# Patient Record
Sex: Female | Born: 1990 | Race: White | Hispanic: No | Marital: Single | Smoking: Current every day smoker
Health system: Southern US, Community
[De-identification: ages and names within clinical notes are randomized; demographics above are authoritative.]

## PROBLEM LIST (undated history)

## (undated) ENCOUNTER — Inpatient Hospital Stay (HOSPITAL_COMMUNITY): Payer: Self-pay

## (undated) DIAGNOSIS — O2341 Unspecified infection of urinary tract in pregnancy, first trimester: Secondary | ICD-10-CM

## (undated) DIAGNOSIS — F32A Depression, unspecified: Secondary | ICD-10-CM

## (undated) DIAGNOSIS — B999 Unspecified infectious disease: Secondary | ICD-10-CM

## (undated) DIAGNOSIS — F329 Major depressive disorder, single episode, unspecified: Secondary | ICD-10-CM

## (undated) DIAGNOSIS — K219 Gastro-esophageal reflux disease without esophagitis: Secondary | ICD-10-CM

## (undated) DIAGNOSIS — B009 Herpesviral infection, unspecified: Secondary | ICD-10-CM

## (undated) HISTORY — DX: Gastro-esophageal reflux disease without esophagitis: K21.9

## (undated) HISTORY — DX: Depression, unspecified: F32.A

## (undated) HISTORY — DX: Major depressive disorder, single episode, unspecified: F32.9

## (undated) HISTORY — PX: DILATION AND CURETTAGE OF UTERUS: SHX78

## (undated) HISTORY — DX: Herpesviral infection, unspecified: B00.9

## (undated) HISTORY — DX: Unspecified infection of urinary tract in pregnancy, first trimester: O23.41

---
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
## 2011-01-01 LAB — RAPID URINE DRUG SCREEN, HOSP PERFORMED
Barbiturates: NOT DETECTED
Opiates: NOT DETECTED
Tetrahydrocannabinol: POSITIVE — AB

## 2011-01-05 LAB — URINALYSIS, ROUTINE W REFLEX MICROSCOPIC
Glucose, UA: NEGATIVE mg/dL
Nitrite: NEGATIVE
Protein, ur: NEGATIVE mg/dL
pH: 5.5 (ref 5.0–8.0)

## 2011-01-05 LAB — CBC
HCT: 31.8 % — ABNORMAL LOW (ref 36.0–49.0)
Platelets: 183 10*3/uL (ref 150–400)
WBC: 7.5 10*3/uL (ref 4.5–13.5)

## 2011-01-05 LAB — COMPREHENSIVE METABOLIC PANEL
ALT: 10 U/L (ref 0–35)
AST: 17 U/L (ref 0–37)
Albumin: 3.7 g/dL (ref 3.5–5.2)
Alkaline Phosphatase: 67 U/L (ref 47–119)
BUN: 6 mg/dL (ref 6–23)
Chloride: 106 mEq/L (ref 96–112)
Potassium: 3.4 mEq/L — ABNORMAL LOW (ref 3.5–5.1)
Sodium: 138 mEq/L (ref 135–145)
Total Bilirubin: 0.3 mg/dL (ref 0.3–1.2)
Total Protein: 6.5 g/dL (ref 6.0–8.3)

## 2011-01-05 LAB — DIFFERENTIAL
Basophils Absolute: 0 10*3/uL (ref 0.0–0.1)
Basophils Relative: 1 % (ref 0–1)
Eosinophils Absolute: 0 10*3/uL (ref 0.0–1.2)
Eosinophils Relative: 0 % (ref 0–5)
Monocytes Absolute: 0.4 10*3/uL (ref 0.2–1.2)
Monocytes Relative: 6 % (ref 3–11)

## 2011-01-05 LAB — PREGNANCY, URINE: Preg Test, Ur: NEGATIVE

## 2011-01-06 LAB — URINALYSIS, ROUTINE W REFLEX MICROSCOPIC
Glucose, UA: NEGATIVE mg/dL
Hgb urine dipstick: NEGATIVE
Protein, ur: 30 mg/dL — AB
Urobilinogen, UA: 4 mg/dL — ABNORMAL HIGH (ref 0.0–1.0)

## 2011-01-06 LAB — URINE MICROSCOPIC-ADD ON

## 2011-01-06 LAB — URINE CULTURE: Colony Count: 15000

## 2011-01-14 LAB — COMPREHENSIVE METABOLIC PANEL
ALT: 12 U/L (ref 0–35)
AST: 22 U/L (ref 0–37)
Albumin: 4.1 g/dL (ref 3.5–5.2)
Alkaline Phosphatase: 103 U/L (ref 47–119)
Calcium: 9.6 mg/dL (ref 8.4–10.5)
Potassium: 3.7 mEq/L (ref 3.5–5.1)
Sodium: 135 mEq/L (ref 135–145)
Total Protein: 7.3 g/dL (ref 6.0–8.3)

## 2011-01-14 LAB — URINE MICROSCOPIC-ADD ON

## 2011-01-14 LAB — CBC
MCHC: 33 g/dL (ref 31.0–37.0)
RDW: 15.3 % (ref 11.4–15.5)

## 2011-01-14 LAB — URINALYSIS, ROUTINE W REFLEX MICROSCOPIC
Glucose, UA: NEGATIVE mg/dL
Hgb urine dipstick: NEGATIVE
Ketones, ur: >80 mg/dL — AB
Leukocytes, UA: NEGATIVE
Protein, ur: 100 mg/dL — AB
pH: 6 (ref 5.0–8.0)

## 2011-01-14 LAB — DIFFERENTIAL
Eosinophils Absolute: 0 10*3/uL (ref 0.0–1.2)
Eosinophils Relative: 0 % (ref 0–5)
Lymphs Abs: 1.1 10*3/uL (ref 1.1–4.8)
Monocytes Absolute: 0.4 10*3/uL (ref 0.2–1.2)
Monocytes Relative: 6 % (ref 3–11)

## 2011-06-19 LAB — STREP A DNA PROBE

## 2011-06-25 LAB — URINALYSIS, ROUTINE W REFLEX MICROSCOPIC
Ketones, ur: NEGATIVE
Nitrite: NEGATIVE
pH: 7.5

## 2011-06-25 LAB — URINE MICROSCOPIC-ADD ON

## 2011-06-25 LAB — URINE CULTURE: Culture: NO GROWTH

## 2011-07-11 LAB — URINALYSIS, ROUTINE W REFLEX MICROSCOPIC
Glucose, UA: NEGATIVE
Nitrite: NEGATIVE
Specific Gravity, Urine: 1.02
Specific Gravity, Urine: 1.015
Urobilinogen, UA: 1
Urobilinogen, UA: 1
pH: 7

## 2011-07-11 LAB — COMPREHENSIVE METABOLIC PANEL
Alkaline Phosphatase: 93
BUN: 6
Calcium: 9.7
Creatinine, Ser: 0.54
Glucose, Bld: 88
Potassium: 3.8
Total Protein: 7.4

## 2011-07-11 LAB — URINE MICROSCOPIC-ADD ON

## 2011-07-11 LAB — BASIC METABOLIC PANEL
BUN: 8
CO2: 26
Chloride: 103
Creatinine, Ser: 0.53

## 2011-07-11 LAB — CBC
HCT: 33.9
Hemoglobin: 11
MCHC: 32.5
MCHC: 32.9
MCV: 75 — ABNORMAL LOW
MCV: 74.6 — ABNORMAL LOW
Platelets: 311
Platelets: 283
RDW: 17.4 — ABNORMAL HIGH
RDW: 16.7 — ABNORMAL HIGH

## 2011-07-11 LAB — DIFFERENTIAL
Basophils Absolute: 0
Basophils Relative: 1
Basophils Relative: 1
Eosinophils Absolute: 0.1
Lymphocytes Relative: 35
Monocytes Relative: 7
Neutro Abs: 3.3
Neutrophils Relative %: 57
Neutrophils Relative %: 59

## 2011-07-11 LAB — URINE CULTURE: Colony Count: 35000

## 2011-07-17 LAB — CBC
HCT: 31 — ABNORMAL LOW
Hemoglobin: 10.3 — ABNORMAL LOW
MCHC: 33.2
MCV: 71.2 — ABNORMAL LOW
Platelets: 276
RBC: 4.36
RDW: 18.9 — ABNORMAL HIGH
WBC: 6.9

## 2011-07-17 LAB — DIFFERENTIAL
Basophils Relative: 0
Lymphs Abs: 1.6
Monocytes Relative: 5
Neutro Abs: 4.9
Neutrophils Relative %: 70 — ABNORMAL HIGH

## 2011-10-23 ENCOUNTER — Encounter: Payer: Self-pay | Admitting: Family Medicine

## 2011-10-23 ENCOUNTER — Ambulatory Visit (INDEPENDENT_AMBULATORY_CARE_PROVIDER_SITE_OTHER): Payer: Medicaid Other | Admitting: Family Medicine

## 2011-10-23 VITALS — BP 105/77 | HR 72 | Temp 97.9°F | Ht 61.0 in | Wt 104.6 lb

## 2011-10-23 DIAGNOSIS — Z3201 Encounter for pregnancy test, result positive: Secondary | ICD-10-CM

## 2011-10-23 DIAGNOSIS — F32A Depression, unspecified: Secondary | ICD-10-CM | POA: Insufficient documentation

## 2011-10-23 DIAGNOSIS — F329 Major depressive disorder, single episode, unspecified: Secondary | ICD-10-CM

## 2011-10-23 DIAGNOSIS — N926 Irregular menstruation, unspecified: Secondary | ICD-10-CM

## 2011-10-23 DIAGNOSIS — R112 Nausea with vomiting, unspecified: Secondary | ICD-10-CM

## 2011-10-23 DIAGNOSIS — N939 Abnormal uterine and vaginal bleeding, unspecified: Secondary | ICD-10-CM

## 2011-10-23 MED ORDER — RANITIDINE HCL 150 MG PO CAPS: 150.0000 mg | ORAL_CAPSULE | Freq: Every evening | ORAL | Status: DC

## 2011-10-23 NOTE — Assessment & Plan Note (Addendum)
Intermenstrual spotting. Timing seems interesting with positive home pregnancy test, but u preg negative today. Possibly implantation bleeding if this is early pregnancy. Will f/u in 2 weeks for recheck of pregnancy test. Will also check GC/Ch cultures, wet prep at that time as patient's urine sample has already been wasted. Condoms given today.

## 2011-10-23 NOTE — Progress Notes (Signed)
  Subjective:    Patient ID: April [name], female    DOB: 1991/03/14, 21 y.o.   MRN: [id number]  HPI Re-establish care  1. Positive pregnancy test. LMP was end of last month. Positive at home 7 days ago.  Checked test due to having some extra spotting, none in past 5 days. Also having some nausea and vomiting intermittently-similar to last pregnancy. Last unprotected sex was 5 days ago. Does not desire pregnancy. Not using contraception. Comes for confirmatory testing today. If negative desires to start depo injections.   2. ?depression. Patient having mood swings rapidly throughout the day. Friends have told her they think she's bipolar. Sometimes feels depressed or anxious. Anger issues. Has trouble sleeping. Denies racing thoughts, pressured speech, grandiosity.   Hx of ?postpartum depression after first pregnancy at age 24.   Review of Systems     Objective:   Physical Exam  Vitals reviewed. Constitutional: She is oriented to person, place, and time. She appears well-developed and well-nourished. No distress.       Seems quite happy preg test is negative in office. Smiles and laughs.  HENT:  Head: Normocephalic and atraumatic.  Eyes: EOM are normal.  Cardiovascular: Normal rate, regular rhythm, normal heart sounds and intact distal pulses.   No murmur heard. Pulmonary/Chest: Effort normal and breath sounds normal. No respiratory distress. She has no wheezes.  Abdominal: Soft. Bowel sounds are normal. She exhibits no distension. There is no tenderness. There is no rebound and no guarding.  Musculoskeletal: She exhibits no edema.  Neurological: She is alert and oriented to person, place, and time. Coordination normal.  Skin: No rash noted. She is not diaphoretic.  Psychiatric: She has a normal mood and affect.      Assessment & Plan:

## 2011-10-23 NOTE — Assessment & Plan Note (Signed)
Minimal time to discuss details. I see this is a recurrent complaint as she was evaluated in last postpartum period for similar symptoms. Not acutely worsened. Seems to have some mood swings and possibly anger confounding. Discussed options of beginning with therapist (Dr. Pascal Lux vs Family services). Gave PHQ-9 and Mood DO questionnare to return in 2 weeks for more detailed discussion. If not pregnant may consider trial of SSRI.

## 2011-10-23 NOTE — Assessment & Plan Note (Signed)
None today. Upreg negative. Possible this is VERY early pregnancy since last unprotected intercourse 5 days ago. Have advised trial of either ranitidine (sent rx to pharmacy) or OTC prilosec for possible gastritis. No abdominal pain, fevers or other symptoms of UTI/PID. F/u in 2 weeks.

## 2011-10-23 NOTE — Patient Instructions (Signed)
Use condoms if you have sex. Try to take prilosec for stomach irritation. I have sent this to your pharmacy. Make an appointment in 2 weeks with Dr. Cristal Ford for followup depression and for pregnancy test and depo shot if negative.  Depression, Adolescent and Adult Depression is a true and treatable medical condition. In general there are two kinds of depression:  Depression we all experience in some form. For example depression from the death of a loved one, financial distress or natural disasters will trigger or increase depression.   Clinical depression, on the other hand, appears without an apparent cause or reason. This depression is a disease. Depression may be caused by chemical imbalance in the body and brain or may come as a response to a physical illness. Alcohol and other drugs can cause depression.  DIAGNOSIS  The diagnosis of depression is usually based upon symptoms and medical history. TREATMENT  Treatments for depression fall into three categories. These are:  Drug therapy. There are many medicines that treat depression. Responses may vary and sometimes trial and error is necessary to determine the best medicines and dosage for a particular patient.   Psychotherapy, also called talking treatments, helps people resolve their problems by looking at them from a different point of view and by giving people insight into their own personal makeup. Traditional psychotherapy looks at a childhood source of a problem. Other psychotherapy will look at current conflicts and move toward solving those. If the cause of depression is drug use, counseling is available to help abstain. In time the depression will usually improve. If there were underlying causes for the chemical use, they can be addressed.   ECT (electroconvulsive therapy) or shock treatment is not as commonly used today. It is a very effective treatment for severe suicidal depression. During ECT electrical impulses are applied to the  head. These impulses cause a generalized seizure. It can be effective but causes a loss of memory for recent events. Sometimes this loss of memory may include the last several months.  Treat all depression or suicide threats as serious. Obtain professional help. Do not wait to see if serious depression will get better over time without help. Seek help for yourself or those around you. In the [place] the number to the National Suicide Help Lines With 24 Hour Help Are: 1-800-SUICIDE (854)409-4600 Document Released: 09/12/2000 Document Revised: 05/28/2011 Document Reviewed: 05/03/2008 [facility] Patient Information 2012 [place], [place].

## 2011-11-07 ENCOUNTER — Encounter: Payer: Self-pay | Admitting: Family Medicine

## 2011-11-07 ENCOUNTER — Ambulatory Visit (INDEPENDENT_AMBULATORY_CARE_PROVIDER_SITE_OTHER): Payer: Self-pay | Admitting: Family Medicine

## 2011-11-07 VITALS — BP 111/69 | HR 81 | Temp 97.7°F | Ht 61.0 in | Wt 104.1 lb

## 2011-11-07 DIAGNOSIS — F329 Major depressive disorder, single episode, unspecified: Secondary | ICD-10-CM

## 2011-11-07 DIAGNOSIS — G478 Other sleep disorders: Secondary | ICD-10-CM

## 2011-11-07 DIAGNOSIS — F3289 Other specified depressive episodes: Secondary | ICD-10-CM

## 2011-11-07 DIAGNOSIS — G479 Sleep disorder, unspecified: Secondary | ICD-10-CM | POA: Insufficient documentation

## 2011-11-07 DIAGNOSIS — Z309 Encounter for contraceptive management, unspecified: Secondary | ICD-10-CM

## 2011-11-07 LAB — POCT URINALYSIS DIPSTICK
Bilirubin, UA: NEGATIVE
Leukocytes, UA: NEGATIVE
Nitrite, UA: NEGATIVE
Urobilinogen, UA: 0.2
pH, UA: 5.5

## 2011-11-07 LAB — POCT URINE PREGNANCY: Preg Test, Ur: NEGATIVE

## 2011-11-07 MED ORDER — MELATONIN 1 MG PO CAPS: 1.0000 mg | ORAL_CAPSULE | Freq: Every day | ORAL | Status: DC

## 2011-11-07 MED ORDER — MEDROXYPROGESTERONE ACETATE 150 MG/ML IM SUSP: 150.0000 mg | INTRAMUSCULAR | Status: DC

## 2011-11-07 MED ORDER — MEDROXYPROGESTERONE ACETATE 150 MG/ML IM SUSP
150.0000 mg | Freq: Once | INTRAMUSCULAR | Status: AC
  Administered 2011-11-07: 150 mg via INTRAMUSCULAR

## 2011-11-07 NOTE — Progress Notes (Signed)
  Subjective:    Patient ID: April [name], female    DOB: 04/02/1991, 21 y.o.   MRN: [id number]  HPI  1. Contraception. F/u today for repeat u preg. Last intercourse was 3-4 weeks ago. Desires depo injection. Has a 22 yo son present for interview today.  2. Mood problems. Patient did not complete MDQ or PHQ-9 that was given at last visit. She continues to endorse some irritability, mood swings within minutes-feeling happy then sad. She does endorse some hyperactivity, lessened sleep requirement, risky behaviors, and uncharacteristic social behaviors but endorses no problems with her ability to function/work. Also endorses feeling down on most days. She has never been on any meds or needed hospitalization. She states that "bipolar" runs in her family, her brother has taken medication for this in the past. Her main problem is only that her mother and her do not always get along. Pt is able to take care of her son. Denies drug or alcohol use.   3. Sleeping problems. Her main complaint today is having difficulty sleeping. Trouble falling asleep, but when she does fall asleep she can usually sleep through the night. States she took a cousin's xanax one time and slept like a baby. Does not seem to think anything is wrong with that. Denies racing thoughts.   Review of Systems See HPI otherwise negative.     Objective:   Physical Exam  Vitals reviewed. Constitutional: She is oriented to person, place, and time. She appears well-developed and well-nourished. No distress.       Smiles appropriately. Normal speech and thought processes.   HENT:  Head: Normocephalic and atraumatic.  Eyes: EOM are normal.  Cardiovascular: Normal rate, regular rhythm, normal heart sounds and intact distal pulses.   No murmur heard. Pulmonary/Chest: Effort normal.  Neurological: She is alert and oriented to person, place, and time. She exhibits normal muscle tone. Coordination normal.  Psychiatric: She has a normal mood  and affect. Her behavior is normal. Judgment and thought content normal.       Affect euthymic. Normal dress and appearance.   MDQ described above PHQ-9 score is 9 (3 points for trouble falling asleep and 3 for feeling down)    Assessment & Plan:

## 2011-11-07 NOTE — Assessment & Plan Note (Signed)
Discussed sleep hygeine and avoidance of taking other's controlled prescription meds. Advised to start with routine setting and may try melatonin nightly to help natural rhythm. F/u if not improved prn.

## 2011-11-07 NOTE — Patient Instructions (Signed)
NIce to see you again. You may have bipolar disorder. Whenever you can, make an appointment for Mood Disorder clinic/Dr. Pascal Lux. Ok to try melatonin for sleep. Take before bedtime.

## 2011-11-07 NOTE — Assessment & Plan Note (Signed)
Symptoms do not fit with mania as she describes. Sounds more like irritability with frequent mood swings in a single day, do not seem to interfere with lifestyle. Given her family history, she may very well have a component of mood disorder/hypomania but do not feel medical treatment is necessary at this time. Will not start SSRI for possibility of triggering any underlying mood disorder. Have strongly advised patient to seek an psychologist/counselor as she would benefit from therapy, more in depth evaluation of mood symptoms. Also offered evaluation with Mood DO clinic here, but patient seems resistant bc she lives 90 minutes away.

## 2011-11-07 NOTE — Assessment & Plan Note (Signed)
Patient desires to restart depo injections today.

## 2011-11-12 ENCOUNTER — Telehealth: Payer: Self-pay | Admitting: *Deleted

## 2011-11-12 NOTE — Telephone Encounter (Signed)
Attempted to call patient to inform her that urine was normal. Her phone message says that she is unable to take calls at this time.

## 2011-11-12 NOTE — Telephone Encounter (Signed)
Message copied by Farrell Ours on Wed Nov 12, 2011 10:23 AM ------      Message from: Durwin Reges      Created: Tue Nov 11, 2011  7:20 PM      Regarding: Normal urine        Please notify pt her urine test is normal.

## 2012-01-23 ENCOUNTER — Ambulatory Visit: Payer: Self-pay

## 2012-02-12 ENCOUNTER — Emergency Department (HOSPITAL_COMMUNITY)
Admission: EM | Admit: 2012-02-12 | Discharge: 2012-02-13 | Disposition: A | Discharge status: Home / Self Care | Payer: Medicaid Other | Attending: Emergency Medicine | Admitting: Emergency Medicine

## 2012-02-12 ENCOUNTER — Encounter (HOSPITAL_COMMUNITY): Payer: Self-pay | Admitting: Emergency Medicine

## 2012-02-12 DIAGNOSIS — N949 Unspecified condition associated with female genital organs and menstrual cycle: Secondary | ICD-10-CM | POA: Insufficient documentation

## 2012-02-12 DIAGNOSIS — K219 Gastro-esophageal reflux disease without esophagitis: Secondary | ICD-10-CM | POA: Insufficient documentation

## 2012-02-12 DIAGNOSIS — F3289 Other specified depressive episodes: Secondary | ICD-10-CM | POA: Insufficient documentation

## 2012-02-12 DIAGNOSIS — N938 Other specified abnormal uterine and vaginal bleeding: Secondary | ICD-10-CM | POA: Insufficient documentation

## 2012-02-12 DIAGNOSIS — M545 Low back pain, unspecified: Secondary | ICD-10-CM | POA: Insufficient documentation

## 2012-02-12 DIAGNOSIS — R112 Nausea with vomiting, unspecified: Secondary | ICD-10-CM | POA: Insufficient documentation

## 2012-02-12 DIAGNOSIS — R109 Unspecified abdominal pain: Secondary | ICD-10-CM | POA: Insufficient documentation

## 2012-02-12 DIAGNOSIS — N898 Other specified noninflammatory disorders of vagina: Secondary | ICD-10-CM | POA: Insufficient documentation

## 2012-02-12 DIAGNOSIS — F329 Major depressive disorder, single episode, unspecified: Secondary | ICD-10-CM | POA: Insufficient documentation

## 2012-02-12 DIAGNOSIS — R630 Anorexia: Secondary | ICD-10-CM | POA: Insufficient documentation

## 2012-02-12 DIAGNOSIS — R6883 Chills (without fever): Secondary | ICD-10-CM | POA: Insufficient documentation

## 2012-02-12 MED ORDER — ONDANSETRON HCL 4 MG/2ML IJ SOLN
4.0000 mg | Freq: Once | INTRAMUSCULAR | Status: AC
  Administered 2012-02-13: 4 mg via INTRAVENOUS
  Filled 2012-02-12: qty 2

## 2012-02-12 MED ORDER — SODIUM CHLORIDE 0.9 % IV BOLUS (SEPSIS)
1000.0000 mL | Freq: Once | INTRAVENOUS | Status: AC
  Administered 2012-02-13: 1000 mL via INTRAVENOUS

## 2012-02-12 MED ORDER — MORPHINE SULFATE 4 MG/ML IJ SOLN
4.0000 mg | Freq: Once | INTRAMUSCULAR | Status: AC
  Administered 2012-02-13: 4 mg via INTRAVENOUS
  Filled 2012-02-12: qty 1

## 2012-02-12 NOTE — ED Provider Notes (Signed)
History     CSN: [id number]  Arrival date & time 02/12/12  2256   First MD Initiated Contact with Patient 02/12/12 2320      Chief Complaint  Patient presents with  . Vaginal Bleeding    (Consider location/radiation/quality/duration/timing/severity/associated sxs/prior treatment) HPI  pw vaginal bleeding x several weeks since January 30th after receiving Depo shot. Bleeding through 5-6 pads per day. Blood clots. C/O diffuse lower abdominal pain, cramping in nature. States that abdominal pain has been daily x same length. Currently 10/10. C/O lower back cramping. +Chills, denies fever. Denies hematuria/dysuria/freq/urgency. +Nausea with NBNB emesis. Dec PO intake.   Was seen yesterday at [facility] hospital and given IVF and pain medication.    ED Notes, ED Provider Notes from 02/12/12 0000 to 02/12/12 23:14:30       Nada Libman, RN 02/12/2012 23:12      PT. REPORTS VAGINAL BLEEDING SINCE JAN. 30 , 2013 , STATES BLEEDING STARTED AFTER RECEIVING DEPO INJECTION , SEEN AT [facility] H[facility] ER YESTERDAY , RECEIVED IV FLUIDS AND PAIN MEDICATIONS FOR LOW ABDOMINAL PAIN .      Past Medical History  Diagnosis Date  . Depression   . GERD (gastroesophageal reflux disease)     Past Surgical History  Procedure Date  . Dilation and curettage of uterus     Family History  Problem Relation Age of Onset  . Hypertension Maternal Grandmother   . Depression Maternal Grandmother   . Cancer Maternal Grandfather     lung    History  Substance Use Topics  . Smoking status: Current Everyday Smoker  . Smokeless tobacco: Not on file  . Alcohol Use: No    OB History    Grav Para Term Preterm Abortions TAB SAB Ect Mult Living                  Review of Systems  All other systems reviewed and are negative.   except as noted HPI   Allergies  Review of patient's allergies indicates no known allergies.  Home Medications   Current Outpatient Rx  Name Route Sig Dispense  Refill  . HYDROCODONE-ACETAMINOPHEN 5-325 MG PO TABS Oral Take 2 tablets by mouth every 4 (four) hours as needed for pain. 10 tablet 0  . IBUPROFEN 600 MG PO TABS Oral Take 1 tablet (600 mg total) by mouth every 6 (six) hours as needed for pain. 30 tablet 0    BP 104/75  Pulse 97  Temp(Src) 98.1 F (36.7 C) (Oral)  Resp 18  SpO2 100%  Physical Exam  Nursing note and vitals reviewed. Constitutional: She is oriented to person, place, and time. She appears well-developed.  HENT:  Head: Atraumatic.  Mouth/Throat: Oropharynx is clear and moist.  Eyes: Conjunctivae and EOM are normal. Pupils are equal, round, and reactive to light.  Neck: Normal range of motion. Neck supple.  Cardiovascular: Normal rate, regular rhythm, normal heart sounds and intact distal pulses.   Pulmonary/Chest: Effort normal and breath sounds normal. No respiratory distress. She has no wheezes. She has no rales.  Abdominal: Soft. She exhibits no distension. There is tenderness. There is no rebound and no guarding.       Min diffuse abd ttp  Genitourinary:       Blood in vaginal vault No discharge No cmt No r/l adnexal ttp  Musculoskeletal: Normal range of motion.  Neurological: She is alert and oriented to person, place, and time.  Skin: Skin is warm and dry. No rash  noted.  Psychiatric: She has a normal mood and affect.    ED Course  Procedures (including critical care time)  Labs Reviewed  URINALYSIS, DIPSTICK ONLY - Abnormal; Notable for the following:    Hgb urine dipstick LARGE (*)    Ketones, ur >80 (*)    All other components within normal limits  POCT PREGNANCY, URINE  LAB REPORT - SCANNED   No results found.   1. Dysfunctional uterine bleeding     MDM  DUB and crampy lower abd pain after depoprovera injection. Hgb unremarkable. Preg negative. Wet prep and gc/chl performed recently OSH, she declined today. Will not be prescribed estrogen by me today as she is a smoker- higher risk for VTE.  OBGYN f/u.        Forbes Cellar, MD 02/16/12 2137

## 2012-02-12 NOTE — ED Notes (Signed)
PT. REPORTS VAGINAL BLEEDING SINCE JAN. 30 , 2013 , STATES BLEEDING STARTED AFTER RECEIVING DEPO INJECTION , SEEN AT [facility] ER YESTERDAY , RECEIVED IV FLUIDS AND PAIN MEDICATIONS FOR LOW ABDOMINAL PAIN .

## 2012-02-13 LAB — URINALYSIS, DIPSTICK ONLY
Glucose, UA: NEGATIVE mg/dL
Leukocytes, UA: NEGATIVE
Nitrite: NEGATIVE
Protein, ur: NEGATIVE mg/dL
Urobilinogen, UA: 1 mg/dL (ref 0.0–1.0)

## 2012-02-13 MED ORDER — MORPHINE SULFATE 4 MG/ML IJ SOLN
4.0000 mg | Freq: Once | INTRAMUSCULAR | Status: AC
Start: 2012-02-13 — End: 2012-02-13
  Administered 2012-02-13: 4 mg via INTRAVENOUS
  Filled 2012-02-13: qty 1

## 2012-02-13 MED ORDER — HYDROCODONE-ACETAMINOPHEN 5-325 MG PO TABS: 2.0000 | ORAL_TABLET | ORAL | Status: AC | PRN

## 2012-02-13 MED ORDER — IBUPROFEN 600 MG PO TABS: 600.0000 mg | ORAL_TABLET | Freq: Four times a day (QID) | ORAL | Status: AC | PRN

## 2012-02-13 NOTE — ED Notes (Signed)
Pt given liquids

## 2012-02-13 NOTE — Discharge Instructions (Signed)
Abnormal Uterine Bleeding Abnormal uterine bleeding can have many causes. Some cases are simply treated, while others are more serious. There are several kinds of bleeding that is considered abnormal, including:  Bleeding between periods.   Bleeding after sexual intercourse.   Spotting anytime in the menstrual cycle.   Bleeding heavier or more than normal.   Bleeding after menopause.  CAUSES  There are many causes of abnormal uterine bleeding. It can be present in teenagers, pregnant women, women during their reproductive years, and women who have reached menopause. Your caregiver will look for the more common causes depending on your age, signs, symptoms and your particular circumstance. Most cases are not serious and can be treated. Even the more serious causes, like cancer of the female organs, can be treated adequately if found in the early stages. That is why all types of bleeding should be evaluated and treated as soon as possible. DIAGNOSIS  Diagnosing the cause may take several kinds of tests. Your caregiver may:  Take a complete history of the type of bleeding.   Perform a complete physical exam and Pap smear.   Take an ultrasound on the abdomen showing a picture of the female organs and the pelvis.   Inject dye into the uterus and Fallopian tubes and X-ray them (hysterosalpingogram).   Place fluid in the uterus and do an ultrasound (sonohysterogrqphy).   Take a CT scan to examine the female organs and pelvis.   Take an MRI to examine the female organs and pelvis. There is no X-ray involved with this procedure.   Look inside the uterus with a telescope that has a light at the end (hysteroscopy).   Scrap the inside of the uterus to get tissue to examine (Dilatation and Curettage, D&C).   Look into the pelvis with a telescope that has a light at the end (laparoscopy). This is done through a very small cut (incision) in the abdomen.  TREATMENT  Treatment will depend on the  cause of the abnormal bleeding. It can include:  Doing nothing to allow the problem to take care of itself over time.   Hormone treatment.   Birth control pills.   Treating the medical condition causing the problem.   Laparoscopy.   Major or minor surgery   Destroying the lining of the uterus with electrical currant, laser, freezing or heat (uterine ablation).  HOME CARE INSTRUCTIONS   Follow your caregiver's recommendation on how to treat your problem.   See your caregiver if you missed a menstrual period and think you may be pregnant.   If you are bleeding heavily, count the number of pads/tampons you use and how often you have to change them. Tell this to your caregiver.   Avoid sexual intercourse until the problem is controlled.  SEEK MEDICAL CARE IF:   You have any kind of abnormal bleeding mentioned above.   You feel dizzy at times.   You are 21 years old and have not had a menstrual period yet.  SEEK IMMEDIATE MEDICAL CARE IF:   You pass out.   You are changing pads/tampons every 15 to 30 minutes.   You have belly (abdominal) pain.   You have a temperature of 100 F (37.8 C) or higher.   You become sweaty or weak.   You are passing large blood clots from the vagina.   You start to feel sick to your stomach (nauseous) and throw up (vomit).  Document Released: 09/15/2005 Document Revised: 09/04/2011 Document Reviewed: 02/08/2009 ExitCare   Patient Information 2012 [place], [place].  RESOURCE GUIDE  Dental Problems  Patients with Medicaid: [facility] [phone] [address]                                           [phone] [address] Phone:  [phone]                                                   Phone:  [phone]  If unable to pay or uninsured, contact:  [facility] or [facility]. to become qualified for the adult dental clinic.  Chronic Pain Problems Contact [facility]  [phone] Patients need to be referred by their primary care doctor.  Insufficient Money for Medicine Contact United Way:  call "211" or [facility] [phone].  No Primary Care Doctor Call [facility]  2498146666 Other agencies that provide inexpensive medical care    [facility]  [phone]    [facility]  [phone]    [facility]  [phone]    [facility]  [phone]    [facility]  [phone]    [facility]  [phone]  Psychological Services [facility] Behavioral Health  [phone] [facility]  [phone] [facility] Mental Health   [phone] (emergency services [phone])  Abuse/Neglect [facility] Child Abuse Hotline 754-647-5374 [facility] Child Abuse Hotline [phone] (After Hours)  Emergency Shelter [facility] [phone]  Maternity Homes [facility] [place] [facility] [place] [phone] [facility] [phone]  MRSA Hotline #:   639-678-0326    [facility] Resources  [facility] [place]  United [employer]                           [facility] Dept. [address] [place]                     [address]         [address] [place] [address]  Blondell Reveal Phone:  [phone]                                  Phone:  [phone]                   Phone:  [phone]  [facility] Mental Health Phone:  [phone]  [facility] Child Abuse Hotline 508-547-0890 [phone] (After Hours)

## 2012-02-13 NOTE — ED Notes (Signed)
IV removed.

## 2012-02-13 NOTE — ED Notes (Signed)
The pt is here for abd pain and vaginal bleeding that she  Has had for 6 months.  She was seen at [facility] ed last pm

## 2013-05-25 ENCOUNTER — Encounter (HOSPITAL_COMMUNITY): Payer: Self-pay | Admitting: *Deleted

## 2013-05-25 ENCOUNTER — Emergency Department (HOSPITAL_COMMUNITY)
Admission: EM | Admit: 2013-05-25 | Discharge: 2013-05-26 | Disposition: A | Discharge status: Home / Self Care | Payer: Self-pay | Attending: Emergency Medicine | Admitting: Emergency Medicine

## 2013-05-25 DIAGNOSIS — N946 Dysmenorrhea, unspecified: Secondary | ICD-10-CM | POA: Insufficient documentation

## 2013-05-25 DIAGNOSIS — N898 Other specified noninflammatory disorders of vagina: Secondary | ICD-10-CM | POA: Insufficient documentation

## 2013-05-25 DIAGNOSIS — R109 Unspecified abdominal pain: Secondary | ICD-10-CM | POA: Insufficient documentation

## 2013-05-25 DIAGNOSIS — F329 Major depressive disorder, single episode, unspecified: Secondary | ICD-10-CM | POA: Insufficient documentation

## 2013-05-25 DIAGNOSIS — K219 Gastro-esophageal reflux disease without esophagitis: Secondary | ICD-10-CM | POA: Insufficient documentation

## 2013-05-25 DIAGNOSIS — Z3202 Encounter for pregnancy test, result negative: Secondary | ICD-10-CM | POA: Insufficient documentation

## 2013-05-25 DIAGNOSIS — F3289 Other specified depressive episodes: Secondary | ICD-10-CM | POA: Insufficient documentation

## 2013-05-25 DIAGNOSIS — F17200 Nicotine dependence, unspecified, uncomplicated: Secondary | ICD-10-CM | POA: Insufficient documentation

## 2013-05-25 LAB — URINE MICROSCOPIC-ADD ON

## 2013-05-25 LAB — URINALYSIS, ROUTINE W REFLEX MICROSCOPIC
Bilirubin Urine: NEGATIVE
Glucose, UA: NEGATIVE mg/dL
Ketones, ur: NEGATIVE mg/dL
Specific Gravity, Urine: 1.023 (ref 1.005–1.030)
pH: 8 (ref 5.0–8.0)

## 2013-05-25 LAB — COMPREHENSIVE METABOLIC PANEL
Albumin: 4.6 g/dL (ref 3.5–5.2)
Alkaline Phosphatase: 77 U/L (ref 39–117)
BUN: 8 mg/dL (ref 6–23)
Chloride: 99 mEq/L (ref 96–112)
Creatinine, Ser: 0.67 mg/dL (ref 0.50–1.10)
GFR calc Af Amer: >90 mL/min (ref >90)
Glucose, Bld: 85 mg/dL (ref 70–99)
Potassium: 3.9 mEq/L (ref 3.5–5.1)
Total Bilirubin: 0.2 mg/dL — ABNORMAL LOW (ref 0.3–1.2)
Total Protein: 8.2 g/dL (ref 6.0–8.3)

## 2013-05-25 LAB — CBC WITH DIFFERENTIAL/PLATELET
Basophils Relative: 0 % (ref 0–1)
Eosinophils Absolute: 0 10*3/uL (ref 0.0–0.7)
HCT: 42.1 % (ref 36.0–46.0)
Hemoglobin: 14.6 g/dL (ref 12.0–15.0)
Lymphs Abs: 2.2 10*3/uL (ref 0.7–4.0)
MCH: 29.6 pg (ref 26.0–34.0)
MCHC: 34.7 g/dL (ref 30.0–36.0)
Monocytes Absolute: 0.7 10*3/uL (ref 0.1–1.0)
Monocytes Relative: 8 % (ref 3–12)
RBC: 4.94 MIL/uL (ref 3.87–5.11)

## 2013-05-25 LAB — LIPASE, BLOOD: Lipase: 32 U/L (ref 11–59)

## 2013-05-25 LAB — WET PREP, GENITAL: Yeast Wet Prep HPF POC: NONE SEEN

## 2013-05-25 LAB — POCT PREGNANCY, URINE: Preg Test, Ur: NEGATIVE

## 2013-05-25 MED ORDER — KETOROLAC TROMETHAMINE 30 MG/ML IJ SOLN
30.0000 mg | Freq: Once | INTRAMUSCULAR | Status: AC
  Administered 2013-05-25: 30 mg via INTRAMUSCULAR
  Filled 2013-05-25: qty 1

## 2013-05-25 MED ORDER — CEFTRIAXONE SODIUM 250 MG IJ SOLR
250.0000 mg | Freq: Once | INTRAMUSCULAR | Status: AC
  Administered 2013-05-26: 250 mg via INTRAMUSCULAR
  Filled 2013-05-25: qty 250

## 2013-05-25 MED ORDER — KETOROLAC TROMETHAMINE 30 MG/ML IJ SOLN: 30.0000 mg | Freq: Once | INTRAMUSCULAR | Status: DC

## 2013-05-25 MED ORDER — IBUPROFEN 800 MG PO TABS
800.0000 mg | ORAL_TABLET | Freq: Once | ORAL | Status: AC
  Administered 2013-05-25: 800 mg via ORAL
  Filled 2013-05-25: qty 1

## 2013-05-25 MED ORDER — AZITHROMYCIN 250 MG PO TABS
1000.0000 mg | ORAL_TABLET | Freq: Once | ORAL | Status: AC
  Administered 2013-05-26: 1000 mg via ORAL
  Filled 2013-05-25: qty 4

## 2013-05-25 NOTE — ED Provider Notes (Signed)
CSN: [id number]     Arrival date & time 05/25/13  2002 History   First MD Initiated Contact with Patient 05/25/13 2155     Chief Complaint  Patient presents with  . Vaginal Bleeding   (Consider location/radiation/quality/duration/timing/severity/associated sxs/prior Treatment) HPI Comments: Patient presents with a chief complaint of vaginal bleeding.  She reports that she has had bleeding for the past 8 days.  She states that she started her menstrual cycle 8 days ago.  She states that her menstrual cycles typically only last 6 days and that she was concerned that the bleeding has been lasting for 8 days.  She reports that she is changing a tampon/pad six times a day.  She reports that the bleeding has slowed down at this time.  Bleeding associated with some lower abdominal cramping.  She reports that the cramping is worse than the cramping that she typically has with her menstrual cycle.  She is currently sexually active and does have unprotected sex.  She also reports that she has noticed an odorous vaginal discharge that is brownish in color.  She denies nausea, vomiting, SOB, dizziness, lightheadedness, urinary symptoms, fever, chills, or fatigue.    Patient is a 22 y.o. female presenting with vaginal bleeding. The history is provided by the patient.  Vaginal Bleeding Associated symptoms: no dysuria and no nausea     Past Medical History  Diagnosis Date  . Depression   . GERD (gastroesophageal reflux disease)    Past Surgical History  Procedure Laterality Date  . Dilation and curettage of uterus     Family History  Problem Relation Age of Onset  . Hypertension Maternal Grandmother   . Depression Maternal Grandmother   . Cancer Maternal Grandfather     lung   History  Substance Use Topics  . Smoking status: Current Every Day Smoker  . Smokeless tobacco: Not on file  . Alcohol Use: No   OB History   Grav Para Term Preterm Abortions TAB SAB Ect Mult Living                  Review of Systems  Gastrointestinal: Negative for nausea and vomiting.  Genitourinary: Positive for vaginal bleeding, menstrual problem and pelvic pain. Negative for dysuria, urgency and frequency.  All other systems reviewed and are negative.    Allergies  Review of patient's allergies indicates no known allergies.  Home Medications  No current outpatient prescriptions on file. BP 110/81  Pulse 85  Temp(Src) 98.2 F (36.8 C) (Oral)  Resp 16  SpO2 100% Physical Exam  Nursing note and vitals reviewed. Constitutional: She appears well-developed and well-nourished.  HENT:  Head: Normocephalic and atraumatic.  Neck: Neck supple.  Cardiovascular: Normal rate, regular rhythm and normal heart sounds.   Pulmonary/Chest: Effort normal and breath sounds normal.  Abdominal: Soft. Bowel sounds are normal. She exhibits no distension and no mass. There is tenderness. There is no rebound and no guarding.  Mild suprapubic tenderness to palpation  Genitourinary: Cervix exhibits no motion tenderness. Right adnexum displays no mass, no tenderness and no fullness. Left adnexum displays no mass, no tenderness and no fullness.  No bleeding visualized on pelvic exam  Neurological: She is alert.  Skin: Skin is warm and dry.  Psychiatric: She has a normal mood and affect.    ED Course  Procedures (including critical care time) Labs Review Labs Reviewed  URINALYSIS, ROUTINE W REFLEX MICROSCOPIC - Abnormal; Notable for the following:    APPearance CLOUDY (*)  Hgb urine dipstick LARGE (*)    Leukocytes, UA SMALL (*)    All other components within normal limits  COMPREHENSIVE METABOLIC PANEL - Abnormal; Notable for the following:    Total Bilirubin 0.2 (*)    All other components within normal limits  URINE MICROSCOPIC-ADD ON - Abnormal; Notable for the following:    Squamous Epithelial / LPF MANY (*)    Bacteria, UA MANY (*)    All other components within normal limits  GC/CHLAMYDIA  PROBE AMP  WET PREP, GENITAL  URINE CULTURE  CBC WITH DIFFERENTIAL  LIPASE, BLOOD  POCT PREGNANCY, URINE   Imaging Review No results found.  MDM  No diagnosis found. Patient presenting with a chief complaint of abdominal cramping and vaginal bleeding.  She reports that she is currently on her menstrual cycle, but feels that the cramping is more severe than her usual cramping. Patient is afebrile.  Urine pregnancy negative.  Hemoglobin 14.6.  No pain with pelvic exam.  Patient does report that she does have unprotected sex and is concerned about STD.  GC/Chalmydia pending.  Patient treated empirically with Azithromycin and Ceftriaxone in the ED.  Feel that the patient is stable for discharge.  Patient given referral to Gynecology.  Return precautions discussed.    Pascal Lux Saltillo, PA-C 05/26/13 2232

## 2013-05-25 NOTE — ED Notes (Signed)
Pt reports that she has had vaginal bleeding x 1 week.  Reports that the discharge has an odor.  Pt also reports dry mouth.

## 2013-05-26 LAB — URINE CULTURE

## 2013-05-26 MED ORDER — LIDOCAINE HCL (PF) 1 % IJ SOLN
INTRAMUSCULAR | Status: AC
  Administered 2013-05-26: 0.9 mL
  Filled 2013-05-26: qty 5

## 2013-05-27 NOTE — ED Provider Notes (Signed)
Medical screening examination/treatment/procedure(s) were performed by non-physician practitioner and as supervising physician I was immediately available for consultation/collaboration.  Dayton Kenley F Clemons Salvucci, MD 05/27/13 1450 

## 2013-05-28 ENCOUNTER — Telehealth (HOSPITAL_COMMUNITY): Payer: Self-pay | Admitting: Emergency Medicine

## 2013-05-28 NOTE — ED Notes (Signed)
Patient has +Chlamydia. 

## 2013-05-28 NOTE — ED Notes (Signed)
+  Chlamydia. Patient treated with Rocephin and Zithromax. [employer] faxed. 

## 2013-06-01 NOTE — ED Notes (Signed)
Patient informed of positive results after id'd x 2 and informed of need to notify partner to be treated. 

## 2013-07-07 ENCOUNTER — Ambulatory Visit (INDEPENDENT_AMBULATORY_CARE_PROVIDER_SITE_OTHER): Payer: Medicaid Other | Admitting: Obstetrics & Gynecology

## 2013-07-07 ENCOUNTER — Encounter: Payer: Self-pay | Admitting: Obstetrics & Gynecology

## 2013-07-07 VITALS — BP 119/78 | HR 97 | Temp 96.8°F | Ht 61.0 in | Wt 110.2 lb

## 2013-07-07 DIAGNOSIS — N920 Excessive and frequent menstruation with regular cycle: Secondary | ICD-10-CM

## 2013-07-07 DIAGNOSIS — A749 Chlamydial infection, unspecified: Secondary | ICD-10-CM

## 2013-07-07 MED ORDER — AZITHROMYCIN 1 G PO PACK
1.0000 g | PACK | Freq: Once | ORAL | Status: AC
  Administered 2013-07-07: 1 g via ORAL

## 2013-07-07 NOTE — Progress Notes (Signed)
GYNECOLOGY CLINIC ENCOUNTER NOTE  History:  22 y.o. [id number] here today for evaluation of menorrhagia.  Reports that periods are now 8 days in duration used to be six days.  Uses six tampons a day, up from 3 or 4.  Was on Depo Provera in the past, caused a lot of bleeding so she stopped. Smokes 1 ppd x 6 years.   Of note, patient was diagnosed with Chlamydia on 05/25/13. She received Rx for Azithromycin but did not fill it.  Received Rocephin in the ER.   The following portions of the patient's history were reviewed and updated as appropriate: allergies, current medications, past family history, past medical history, past social history, past surgical history and problem list.  Review of Systems:  Pertinent items are noted in HPI.  Objective:  Physical Exam BP 119/78  Pulse 97  Temp(Src) 96.8 F (36 C)  Ht 5\' 1"  (1.549 m)  Wt 110 lb 3.2 oz (49.986 kg)  BMI 20.83 kg/m2  LMP 07/02/2013 Gen: NAD Abd: Soft, nontender and nondistended Pelvic: Normal appearing external genitalia; normal appearing vaginal mucosa and cervix.  Brown discharge noted.  Small uterus, no other palpable masses, mild uterine and adnexal tenderness  Assessment & Plan:  Will check menorrhagia labs and ultrasound. Recommended Mirena IUD, patient wants to get this treatment. Azithromycin given to patient here in clinic.   Recommend testing for other STIs, also needs to let partner(s) know so the partner(s) can get testing and treatment. No unprotected intercourse until she and her partner(s) are treated and until Mirena IUD placement. Return for IUD placement in about a month, will also get annual exam and TOC for chlamydia.

## 2013-07-07 NOTE — Patient Instructions (Signed)
Menorrhagia Dysfunctional uterine bleeding is different from a normal menstrual period. When periods are heavy or there is more bleeding than is usual for you, it is called menorrhagia. It may be caused by hormonal imbalance, or physical, metabolic, or other problems. Examination is necessary in order that your caregiver may treat treatable causes. If this is a continuing problem, a D&C may be needed. That means that the cervix (the opening of the uterus or womb) is dilated (stretched larger) and the lining of the uterus is scraped out. The tissue scraped out is then examined under a microscope by a specialist (pathologist) to make sure there is nothing of concern that needs further or more extensive treatment. HOME CARE INSTRUCTIONS   If medications were prescribed, take exactly as directed. Do not change or switch medications without consulting your caregiver.  Long term heavy bleeding may result in iron deficiency. Your caregiver may have prescribed iron pills. They help replace the iron your body lost from heavy bleeding. Take exactly as directed. Iron may cause constipation. If this becomes a problem, increase the bran, fruits, and roughage in your diet.  Do not take aspirin or medicines that contain aspirin one week before or during your menstrual period. Aspirin may make the bleeding worse.  If you need to change your sanitary pad or tampon more than once every 2 hours, stay in bed and rest as much as possible until the bleeding stops.  Eat well-balanced meals. Eat foods high in iron. Examples are leafy green vegetables, meat, liver, eggs, and whole grain breads and cereals. Do not try to lose weight until the abnormal bleeding has stopped and your blood iron level is back to normal. SEEK MEDICAL CARE IF:   You need to change your sanitary pad or tampon more than once an hour.  You develop nausea (feeling sick to your stomach) and vomiting, dizziness, or diarrhea while you are taking your  medicine.  You have any problems that may be related to the medicine you are taking. SEEK IMMEDIATE MEDICAL CARE IF:   You have a fever.  You develop chills.  You develop severe bleeding or start to pass blood clots.  You feel dizzy or faint. MAKE SURE YOU:   Understand these instructions.  Will watch your condition.  Will get help right away if you are not doing well or get worse. Document Released: 09/15/2005 Document Revised: 12/08/2011 Document Reviewed: 05/05/2008 [facility] Patient Information 2014 [place], [place].   Intrauterine Device Information An intrauterine device (IUD) is inserted into your uterus and prevents pregnancy. There are 2 types of IUDs available:  Copper IUD. This type of IUD is wrapped in copper wire and is placed inside the uterus. Copper makes the uterus and fallopian tubes produce a fluid that kills sperm. The copper IUD can stay in place for 10 years.  Hormone IUD. This type of IUD contains the hormone progestin (synthetic progesterone). The hormone thickens the cervical mucus and prevents sperm from entering the uterus, and it also thins the uterine lining to prevent implantation of a fertilized egg. The hormone can weaken or kill the sperm that get into the uterus. The hormone IUD can stay in place for 5 years. Your caregiver will make sure you are a good candidate for a contraceptive IUD. Discuss with your caregiver the possible side effects. ADVANTAGES  It is highly effective, reversible, long-acting, and low maintenance.  There are no estrogen-related side effects.  An IUD can be used when breastfeeding.  It is  not associated with weight gain.  It works immediately after insertion.  The copper IUD does not interfere with your female hormones.  The progesterone IUD can make heavy menstrual periods lighter.  The progesterone IUD can be used for 5 years.  The copper IUD can be used for 10 years. DISADVANTAGES  The progesterone IUD can  be associated with irregular bleeding patterns.  The copper IUD can make your menstrual flow heavier and more painful.  You may experience cramping and vaginal bleeding after insertion. Document Released: 08/19/2004 Document Revised: 12/08/2011 Document Reviewed: 01/18/2011 [facility] Patient Information 2014 [place], [place].   Intrauterine Device Insertion Most often, an intrauterine device (IUD) is inserted into the uterus to prevent pregnancy. There are 2 types of IUDs available:  Copper IUD. This type of IUD creates an environment that is not favorable to sperm survival. The mechanism of action of the copper IUD is not known for certain. It can stay in place for 10 years.  Hormone IUD. This type of IUD contains the hormone progestin (synthetic progesterone). The progestin thickens the cervical mucus and prevents sperm from entering the uterus, and it also thins the uterine lining. There is no evidence that the hormone IUD prevents implantation. The hormone IUD can stay in place for up to 5 years. An IUD is the most cost-effective birth control if left in place for the full duration. It may be removed at any time. LET YOUR CAREGIVER KNOW ABOUT:  Sensitivity to metals.  Medicines taken including herbs, eyedrops, over-the-counter medicines, and creams.  Use of steroids (by mouth or creams).  Previous problems with anesthetics or numbing medicine.  Previous gynecological surgery.  History of blood clots or clotting disorders.  Possibility of pregnancy.  Menstrual irregularities.  Concerns regarding unusual vaginal discharge or odors.  Previous experience with an IUD.  Other health problems. RISKS AND COMPLICATIONS  Accidental puncture (perforation) of the uterus.  Accidental placement of the IUD either in the muscle layer of the uterus (myometrium) or outside the uterus. If this happen, the IUD can be found essentially floating around the bowels. When this happens, the IUD  must be taken out surgically.  The IUD may fall out of the uterus (expulsion). This is more common in women who have recently had a child.   Pregnancy in the fallopian tube (ectopic). BEFORE THE PROCEDURE  Schedule the IUD insertion for when you will have your menstrual period or right after, to make sure you are not pregnant. Placement of the IUD is better tolerated shortly after a menstrual cycle.  You may need to take tests or be examined to make sure you are not pregnant.  You may be required to take a pregnancy test.  You may be required to get checked for sexually transmitted infections (STIs) prior to placement. Placing an IUD in someone who has an infection can make an infection worse.  You may be given a pain reliever to take 1 or 2 hours before the procedure.  An exam will be performed to determine the size and position of your uterus.  Ask your caregiver about changing or stopping your regular medicines. PROCEDURE   A tool (speculum) is placed in the vagina. This allows your caregiver to see the lower part of the uterus (cervix).  The cervix is prepped with a medicine that lowers the risk of infection.  You may be given a medicine to numb each side of the cervix (intracervical or paracervical block). This is used to block and  control any discomfort with insertion.  A tool (uterine sound) is inserted into the uterus to determine the length of the uterine cavity and the direction the uterus may be tilted.  A slim instrument (IUD inserter) is inserted through the cervical canal and into your uterus.  The IUD is placed in the uterine cavity and the insertion device is removed.  The nylon string that is attached to the IUD, and used for eventual IUD removal, is trimmed. It is trimmed so that it lays high in the vagina, just outside the cervix. AFTER THE PROCEDURE  You may have bleeding after the procedure. This is normal. It varies from light spotting for a few days to  menstrual-like bleeding.  You may have mild cramping.  Practice checking the string coming out of the cervix to make sure the IUD remains in the uterus. If you cannot feel the string, you should schedule a "string check" with your caregiver.  If you had a hormone IUD inserted, expect that your period may be lighter or nonexistent within a year's time (though this is not always the case). There may be delayed fertility with the hormone IUD as a result of its progesterone effect. When you are ready to become pregnant, it is suggested to have the IUD removed up to 1 year in advance.  Yearly exams are advised. Document Released: 05/14/2011 Document Revised: 12/08/2011 Document Reviewed: 05/14/2011 [facility] Patient Information 2014 [place], [place].

## 2013-07-12 ENCOUNTER — Ambulatory Visit (HOSPITAL_COMMUNITY): Payer: Self-pay | Attending: Obstetrics & Gynecology

## 2013-08-22 ENCOUNTER — Ambulatory Visit: Payer: Self-pay | Admitting: Obstetrics & Gynecology

## 2014-03-02 ENCOUNTER — Emergency Department (HOSPITAL_COMMUNITY)
Admission: EM | Admit: 2014-03-02 | Discharge: 2014-03-03 | Disposition: A | Discharge status: Home / Self Care | Payer: Self-pay | Attending: Emergency Medicine | Admitting: Emergency Medicine

## 2014-03-02 ENCOUNTER — Encounter (HOSPITAL_COMMUNITY): Payer: Self-pay | Admitting: Emergency Medicine

## 2014-03-02 DIAGNOSIS — Z8659 Personal history of other mental and behavioral disorders: Secondary | ICD-10-CM | POA: Insufficient documentation

## 2014-03-02 DIAGNOSIS — F17200 Nicotine dependence, unspecified, uncomplicated: Secondary | ICD-10-CM | POA: Insufficient documentation

## 2014-03-02 DIAGNOSIS — Z8719 Personal history of other diseases of the digestive system: Secondary | ICD-10-CM | POA: Insufficient documentation

## 2014-03-02 DIAGNOSIS — A6000 Herpesviral infection of urogenital system, unspecified: Secondary | ICD-10-CM | POA: Insufficient documentation

## 2014-03-02 DIAGNOSIS — L03119 Cellulitis of unspecified part of limb: Principal | ICD-10-CM

## 2014-03-02 DIAGNOSIS — Z9889 Other specified postprocedural states: Secondary | ICD-10-CM | POA: Insufficient documentation

## 2014-03-02 DIAGNOSIS — Z3202 Encounter for pregnancy test, result negative: Secondary | ICD-10-CM | POA: Insufficient documentation

## 2014-03-02 DIAGNOSIS — G479 Sleep disorder, unspecified: Secondary | ICD-10-CM | POA: Insufficient documentation

## 2014-03-02 DIAGNOSIS — L02419 Cutaneous abscess of limb, unspecified: Secondary | ICD-10-CM | POA: Insufficient documentation

## 2014-03-02 DIAGNOSIS — A6009 Herpesviral infection of other urogenital tract: Secondary | ICD-10-CM

## 2014-03-02 DIAGNOSIS — L02415 Cutaneous abscess of right lower limb: Secondary | ICD-10-CM

## 2014-03-02 DIAGNOSIS — R6883 Chills (without fever): Secondary | ICD-10-CM | POA: Insufficient documentation

## 2014-03-02 MED ORDER — OXYCODONE-ACETAMINOPHEN 5-325 MG PO TABS
1.0000 | ORAL_TABLET | Freq: Once | ORAL | Status: AC
  Administered 2014-03-02: 1 via ORAL
  Filled 2014-03-02: qty 1

## 2014-03-02 NOTE — ED Notes (Signed)
Pt. reports persistent abscess at right lateral thigh for 3 weeks , seen at [facility] ER incised and drained last week discharged home with prescription Bactrim DS with no improvement.

## 2014-03-02 NOTE — ED Provider Notes (Signed)
CSN: [id number]     Arrival date & time 03/02/14  2037 History   None    This chart was scribed for non-physician practitioner working with Gwyneth Sprout, MD by Arlan Organ, ED Scribe. This patient was seen in room TR05C/TR05C and the patient's care was started at 10:44 PM.   Chief Complaint  Patient presents with  . Abscess    The history is provided by the patient. No language interpreter was used.    HPI Comments: April [name] is a 23 y.o. female who presents to the Emergency Department complaining of an abscess to the R lateral thigh and superior to the labia majora x 3 weeks that has not improved. She also reports intermittent chills and sleep disturbances. Pt was seen at [facility] ER and had an I&D performed to her R lateral thigh last week. She was discharged home with a prescription for Bactrim DS and Hydrocodone. Pt has 3 more days of her antibiotic left. However, pt reports worsening pain to her R upper extremity. She states she unable to lay on her R side secondary to pain at this time. She has tried warm compresses to the area without any improvement for discomfort. She states she has tried prescribed Hydrocodone without any relief. At this time she denies any fever, abdominal pain, nausea, or vomiting. Pt is requesting an STI screening today. She denies any new recent sexual partners and states she has been with the same person since October 2014. No other concerns this visit.  She is currently not followed by an OB/GYN. Past Medical History  Diagnosis Date  . Depression   . GERD (gastroesophageal reflux disease)    Past Surgical History  Procedure Laterality Date  . Dilation and curettage of uterus     Family History  Problem Relation Age of Onset  . Hypertension Maternal Grandmother   . Depression Maternal Grandmother   . Cancer Maternal Grandfather     lung   History  Substance Use Topics  . Smoking status: Current Every Day Smoker -- 1.00 packs/day for 6 years     Types: Cigarettes    Start date: 04/29/2007  . Smokeless tobacco: Not on file     Comment: Declines help  . Alcohol Use: 0.0 oz/week     Comment: 1-2 cans every weekend   OB History   Grav Para Term Preterm Abortions TAB SAB Ect Mult Living   3 1 1  0 2 1 1   1       Review of Systems  Constitutional: Positive for chills. Negative for fever, activity change, appetite change and fatigue.  HENT: Negative for congestion.   Cardiovascular: Negative for leg swelling.  Gastrointestinal: Negative for nausea, vomiting and abdominal pain.  Genitourinary: Positive for vaginal bleeding (menstrual period) and genital sores. Negative for dysuria, frequency, hematuria, decreased urine volume, vaginal discharge, difficulty urinating and pelvic pain.  Musculoskeletal: Negative for arthralgias, back pain, gait problem and myalgias.  Skin: Positive for wound (Healing abscess).  Neurological: Negative for dizziness, weakness, light-headedness and headaches.  Psychiatric/Behavioral: Positive for sleep disturbance.    Allergies  Review of patient's allergies indicates no known allergies.  Home Medications   Prior to Admission medications   Medication Sig Start Date End Date Taking? Authorizing Provider  acetaminophen (TYLENOL) 325 MG tablet Take 650 mg by mouth every 6 (six) hours as needed for pain.    Historical Provider, MD   Triage Vitals: BP 146/108  Pulse 84  Temp(Src) 97.6 F (36.4 C) (  Oral)  Resp 18  SpO2 98%  LMP 02/28/2014   Filed Vitals:   03/02/14 2103 03/03/14 0101  BP: 146/108 99/62  Pulse: 84 68  Temp: 97.6 F (36.4 C)   TempSrc: Oral   Resp: 18 16  SpO2: 98% 100%    Physical Exam  Nursing note and vitals reviewed. Constitutional: She is oriented to person, place, and time. She appears well-developed and well-nourished. No distress.  HENT:  Head: Normocephalic and atraumatic.  Right Ear: External ear normal.  Left Ear: External ear normal.  Nose: Nose normal.   Mouth/Throat: Oropharynx is clear and moist. No oropharyngeal exudate.  Eyes: Conjunctivae are normal. Right eye exhibits no discharge. Left eye exhibits no discharge.  Neck: Normal range of motion.  Cardiovascular: Normal rate.   Pulmonary/Chest: Effort normal.  Abdominal: Soft. She exhibits no distension. There is no tenderness.  Genitourinary:     10-15 vesicles superior to the labia majora with clear yellow fluid. Vesicles tender to palpation. Minimal blood present in the vaginal vault. No vaginal discharge. No CMT or adnexal tenderness bilaterally.  Musculoskeletal: Normal range of motion. She exhibits no edema and no tenderness.       Legs: ROM in the lower extremities bilaterally. No leg edema throughout.   Neurological: She is alert and oriented to person, place, and time.  Skin: Skin is warm and dry. She is not diaphoretic.  Healing abscess to the right lateral middle thigh with no fluctuance. 3 cm circular area of surrounding erythema. No drainage or open wounds.   Psychiatric: She has a normal mood and affect.    ED Course  Procedures (including critical care time)  DIAGNOSTIC STUDIES: Oxygen Saturation is 98% on RA, Normal by my interpretation.    COORDINATION OF CARE: 10:53 PM- Will give Percocet. Will  Order Herpes simplex virus culture. Discussed treatment plan with pt at bedside and pt agreed to plan.     Labs Review Labs Reviewed - No data to display  Imaging Review No results found.   EKG Interpretation None       WET PREP, GENITAL      Result Value Ref Range   Yeast Wet Prep HPF POC NONE SEEN  NONE SEEN   Trich, Wet Prep NONE SEEN  NONE SEEN   Clue Cells Wet Prep HPF POC FEW (*) NONE SEEN   WBC, Wet Prep HPF POC FEW (*) NONE SEEN  PREGNANCY, URINE      Result Value Ref Range   Preg Test, Ur NEGATIVE  NEGATIVE  URINALYSIS, ROUTINE W REFLEX MICROSCOPIC      Result Value Ref Range   Color, Urine YELLOW  YELLOW   APPearance CLEAR  CLEAR    Specific Gravity, Urine 1.033 (*) 1.005 - 1.030   pH 6.0  5.0 - 8.0   Glucose, UA NEGATIVE  NEGATIVE mg/dL   Hgb urine dipstick NEGATIVE  NEGATIVE   Bilirubin Urine NEGATIVE  NEGATIVE   Ketones, ur NEGATIVE  NEGATIVE mg/dL   Protein, ur NEGATIVE  NEGATIVE mg/dL   Urobilinogen, UA 1.0  0.0 - 1.0 mg/dL   Nitrite NEGATIVE  NEGATIVE   Leukocytes, UA NEGATIVE  NEGATIVE    MDM   April [name] is a 23 y.o. female who presents to the Emergency Department complaining of an abscess to the R lateral thigh and superior to the labia majora x 3 weeks that has not improved. Abscess healing. Drained last week by ED (non-[facility]). No abscess to drain at this  time. Will add Keflex. Patient afebrile and non-toxic in appearance. Doubt septic infection. Patient instructed to continue Bactrim and warm compresses. Percocet for continued pain control. Rash on labia likely due to herpes. Culture taken. Will start on Valtrex. Patient encouraged to follow-up with PCP and women's health. Return precautions, discharge instructions, and follow-up was discussed with the patient before discharge.     Discharge Medication List as of 03/03/2014 12:54 AM    START taking these medications   Details  cephALEXin (KEFLEX) 500 MG capsule Take 1 capsule (500 mg total) by mouth 2 (two) times daily., Starting 03/03/2014, Until Discontinued, Print    oxyCODONE-acetaminophen (PERCOCET/ROXICET) 5-325 MG per tablet Take 1-2 tablets by mouth every 4 (four) hours as needed for severe pain., Starting 03/03/2014, Until Discontinued, Print    valACYclovir (VALTREX) 1000 MG tablet Take 1 tablet (1,000 mg total) by mouth 2 (two) times daily., Starting 03/03/2014, Last dose on Mon 03/13/14, Print         Final impressions: 1. Abscess of right leg   2. Herpes genitalis in women       Luiz Iron PA-C    I personally performed the services described in this documentation, which was scribed in my presence. The recorded information  has been reviewed and is accurate.    Jillyn Ledger, PA-C 03/05/14 1433  Jillyn Ledger, PA-C 03/05/14 1434

## 2014-03-03 LAB — URINALYSIS, ROUTINE W REFLEX MICROSCOPIC
BILIRUBIN URINE: NEGATIVE
GLUCOSE, UA: NEGATIVE mg/dL
Hgb urine dipstick: NEGATIVE
KETONES UR: NEGATIVE mg/dL
Leukocytes, UA: NEGATIVE
NITRITE: NEGATIVE
PH: 6 (ref 5.0–8.0)
Protein, ur: NEGATIVE mg/dL
Specific Gravity, Urine: 1.033 — ABNORMAL HIGH (ref 1.005–1.030)
Urobilinogen, UA: 1 mg/dL (ref 0.0–1.0)

## 2014-03-03 LAB — HIV ANTIBODY (ROUTINE TESTING W REFLEX): HIV 1&2 Ab, 4th Generation: NONREACTIVE

## 2014-03-03 LAB — RPR

## 2014-03-03 LAB — WET PREP, GENITAL
Trich, Wet Prep: NONE SEEN
YEAST WET PREP: NONE SEEN

## 2014-03-03 LAB — PREGNANCY, URINE: PREG TEST UR: NEGATIVE

## 2014-03-03 MED ORDER — CEPHALEXIN 500 MG PO CAPS: 500.0000 mg | ORAL_CAPSULE | Freq: Two times a day (BID) | ORAL | Status: DC

## 2014-03-03 MED ORDER — OXYCODONE-ACETAMINOPHEN 5-325 MG PO TABS: 1.0000 | ORAL_TABLET | ORAL | Status: DC | PRN

## 2014-03-03 MED ORDER — VALACYCLOVIR HCL 1 G PO TABS: 1000.0000 mg | ORAL_TABLET | Freq: Two times a day (BID) | ORAL | Status: AC

## 2014-03-03 NOTE — Discharge Instructions (Signed)
Take valtrex for possible herpes outbreak  Take keflex and continue bactrim for your abscess  Take oxycodone for severe pain - Please be careful with this medication.  It can cause drowsiness.  Use caution while driving, operating machinery, drinking alcohol, or any other activities that may impair your physical or mental abilities.   Avoid sexual intercourse until you know your results  Return to the emergency department if you develop any changing/worsening condition, abdominal pain, fever, repeated vomiting, or any other concerns (please read additional information regarding your condition below)     Abscess An abscess is an infected area that contains a collection of pus and debris.It can occur in almost any part of the body. An abscess is also known as a furuncle or boil. CAUSES  An abscess occurs when tissue gets infected. This can occur from blockage of oil or sweat glands, infection of hair follicles, or a minor injury to the skin. As the body tries to fight the infection, pus collects in the area and creates pressure under the skin. This pressure causes pain. People with weakened immune systems have difficulty fighting infections and get certain abscesses more often.  SYMPTOMS Usually an abscess develops on the skin and becomes a painful mass that is red, warm, and tender. If the abscess forms under the skin, you may feel a moveable soft area under the skin. Some abscesses break open (rupture) on their own, but most will continue to get worse without care. The infection can spread deeper into the body and eventually into the bloodstream, causing you to feel ill.  DIAGNOSIS  Your caregiver will take your medical history and perform a physical exam. A sample of fluid may also be taken from the abscess to determine what is causing your infection. TREATMENT  Your caregiver may prescribe antibiotic medicines to fight the infection. However, taking antibiotics alone usually does not cure an  abscess. Your caregiver may need to make a small cut (incision) in the abscess to drain the pus. In some cases, gauze is packed into the abscess to reduce pain and to continue draining the area. HOME CARE INSTRUCTIONS   Only take over-the-counter or prescription medicines for pain, discomfort, or fever as directed by your caregiver.  If you were prescribed antibiotics, take them as directed. Finish them even if you start to feel better.  If gauze is used, follow your caregiver's directions for changing the gauze.  To avoid spreading the infection:  Keep your draining abscess covered with a bandage.  Wash your hands well.  Do not share personal care items, towels, or whirlpools with others.  Avoid skin contact with others.  Keep your skin and clothes clean around the abscess.  Keep all follow-up appointments as directed by your caregiver. SEEK MEDICAL CARE IF:   You have increased pain, swelling, redness, fluid drainage, or bleeding.  You have muscle aches, chills, or a general ill feeling.  You have a fever. MAKE SURE YOU:   Understand these instructions.  Will watch your condition.  Will get help right away if you are not doing well or get worse. Document Released: 06/25/2005 Document Revised: 03/16/2012 Document Reviewed: 11/28/2011 [facility] Patient Information 2014 [place], [place].  Genital Herpes Genital herpes is a sexually transmitted disease. This means that it is a disease passed by having sex with an infected person. There is no cure for genital herpes. The time between attacks can be months to years. The virus may live in a person but produce no problems (  symptoms). This infection can be passed to a baby as it travels down the birth canal (vagina). In a newborn, this can cause central nervous system damage, eye damage, or even death. The virus that causes genital herpes is usually HSV-2 virus. The virus that causes oral herpes is usually HSV-1. The diagnosis  (learning what is wrong) is made through culture results. SYMPTOMS  Usually symptoms of pain and itching begin a few days to a week after contact. It first appears as small blisters that progress to small painful ulcers which then scab over and heal after several days. It affects the outer genitalia, birth canal, cervix, penis, anal area, buttocks, and thighs. HOME CARE INSTRUCTIONS   Keep ulcerated areas dry and clean.  Take medications as directed. Antiviral medications can speed up healing. They will not prevent recurrences or cure this infection. These medications can also be taken for suppression if there are frequent recurrences.  While the infection is active, it is contagious. Avoid all sexual contact during active infections.  Condoms may help prevent spread of the herpes virus.  Practice safe sex.  Wash your hands thoroughly after touching the genital area.  Avoid touching your eyes after touching your genital area.  Inform your caregiver if you have had genital herpes and become pregnant. It is your responsibility to insure a safe outcome for your baby in this pregnancy.  Only take over-the-counter or prescription medicines for pain, discomfort, or fever as directed by your caregiver. SEEK MEDICAL CARE IF:   You have a recurrence of this infection.  You do not respond to medications and are not improving.  You have new sources of pain or discharge which have changed from the original infection.  You have an oral temperature above 102 F (38.9 C).  You develop abdominal pain.  You develop eye pain or signs of eye infection. Document Released: 09/12/2000 Document Revised: 12/08/2011 Document Reviewed: 10/03/2009 [facility] Patient Information 2014 [place].   Emergency Department Resource Guide 1) Find a Doctor and Pay Out of Pocket Although you won't have to find out who is covered by your insurance plan, it is a good idea to ask around and get  recommendations. You will then need to call the office and see if the doctor you have chosen will accept you as a new patient and what types of options they offer for patients who are self-pay. Some doctors offer discounts or will set up payment plans for their patients who do not have insurance, but you will need to ask so you aren't surprised when you get to your appointment.  2) Contact Your Local Health Department Not all health departments have doctors that can see patients for sick visits, but many do, so it is worth a call to see if yours does. If you don't know where your local health department is, you can check in your phone book. The CDC also has a tool to help you locate your state's health department, and many state websites also have listings of all of their local health departments.  3) Find a Walk-in Clinic If your illness is not likely to be very severe or complicated, you may want to try a walk in clinic. These are popping up all over the country in pharmacies, drugstores, and shopping centers. They're usually staffed by nurse practitioners or physician assistants that have been trained to treat common illnesses and complaints. They're usually fairly quick and inexpensive. However, if you have serious medical issues or chronic medical  problems, these are probably not your best option.  No Primary Care Doctor: - Call Health Connect at  [phone] - they can help you locate a primary care doctor that  accepts your insurance, provides certain services, etc. - Physician Referral Service- [phone]  Chronic Pain Problems: Organization         Address  Phone   Notes  [facility]  [phone] Patients need to be referred by their primary care doctor.   Medication Assistance: Organization         Address  Phone   Notes  [facility] Medication [facility] [address] [place] [place], [place] [address] [phone] --Must be a resident of  [facility] -- Must have NO insurance coverage whatsoever (no Medicaid/ Medicare, etc.) -- The pt. MUST have a primary care doctor that directs their care regularly and follows them in the community   [facility]ist  [phone]   [employer]  [phone]    Agencies that provide inexpensive medical care: Organization         Address  Phone   Notes  [facility]  [phone]   [facility]    [phone]   [facility] [address] [place], [place] [address] [phone]   [facility] [address] [place], [place] [phone]   Planned Parenthood    [phone]   [facility]    [phone]   [facility]  [address], [place] Phone:  [phone], Fax:  [phone] Hours of Operation:  9 am - 6 pm, M-F.  Also accepts Medicaid/Medicare and self-pay.  [facility]  [address], [place] Phone: [phone], Fax: [phone]. Hours of Operation:  8:30 am - 5:30 pm, M-F.  Also accepts Medicaid and self-pay.  [facility] [place] [address], [place] Phone: [phone]   [facility] [address] Natasha Bence [place], [place] [phone], Ext. [phone] Mondays & Thursdays: 7-9 AM.  First 15 patients are seen on a first come, first serve basis.    Medicaid-accepting [facility] Providers:  Organization         Address  Phone   Notes  [facility] [address], [place] [phone] Also accepts self-pay patients.  [facility] [address] Laurell Josephs [place], [place]  [phone]   [facility] [address], [place] [phone]   [facility] [address] [place] [phone]   Renaye Rakers [address], [place]   [phone] Only accepts [place] Access  [place] patients after they have their name applied to their card.   Self-Pay (no insurance) in [facility]:  Organization         Address  Phone   Notes  Sickle Cell Patients, [facility] Internal Medicine [address] [place], [place] [phone]   [facility] [address] [place], [place] [phone]   [facility] [place]  [address],  [phone]   [facility]/Dr. Osei-Bonsu  [address] [place] or [address], [place] [phone] Phone number for both [place] and [place] locations is the same.  [facility]  and [facility] [address], [place] [phone]   [facility] [address], [place] or [address] [phone]   [facility] [address], [place] [phone], phone; [phone], fax Sees patients 1st and 3rd Saturday of every month.  Must not qualify for public or private insurance (i.e. Medicaid, Medicare, [place] Health Choice, Veterans' Benefits)  Household income should be no more than 200% of the poverty level The clinic cannot treat you if you are pregnant or think you are pregnant  Sexually transmitted diseases are not treated at the clinic.    Dental Care: Organization         Address  Phone  Notes  [facility] [address] [place], [place] [phone] Accepts children up to age 30 who are enrolled in [place] or [place] Health Choice; pregnant women with a Medicaid card; and children who have applied for Medicaid or [place] Health Choice, but were declined, whose parents can pay a reduced fee at time of service.  [facility] Department of [facility]  [address], [place] [phone] Accepts children up to age 40 who are enrolled in [place] or [place] Health Choice; pregnant women with a Medicaid  card; and children who have applied for Medicaid or [place] Health Choice, but were declined, whose parents can pay a reduced fee at time of service.  [facility]  [address] [place], [place] [phone] Patients are seen by appointment only. Walk-ins are not accepted. [facility] will see patients 55 years of age and older. Monday - Tuesday (8am-5pm) Most Wednesdays (8:30-5pm) $30 per visit, cash only  [facility] Adult Dental Access PROGRAM  [address], [facility] [phone] Patients are seen by appointment only. Walk-ins are not accepted. [facility] will see patients 7 years of age and older. One Wednesday Evening (Monthly: Volunteer Based).  $30 per visit, cash only  [employer]  [phone] for adults; Children under age 81, call Graduate Pediatric Dentistry at [phone]. Children aged 61-14, please call [phone] to request a pediatric application.  Dental services are provided in all areas of dental care including fillings, crowns and bridges, complete and partial dentures, implants, gum treatment, root canals, and extractions. Preventive care is also provided. Treatment is provided to both adults and children. Patients are selected via a lottery and there is often a waiting list.   [facility] [address], [place]  [phone] [email or web address]   [facility] [address] [place], [place] [phone], Ext. [phone] Second and Fourth Thursday of each month, opens at 6:30 AM; Clinic ends at 9 AM.  Patients are seen on a first-come first-served basis, and a limited number are seen during each clinic.   [facility]  [address] Ether Griffins [place], [place] [phone]   Eligibility Requirements You must have lived in [place], [place], or [place] counties for at least the last three months.   You cannot be eligible for state or federal sponsored [place],  including [employer], [place], or [employer].   You generally cannot be eligible for healthcare insurance through your employer.    How to apply: Eligibility screenings are held every Tuesday and Wednesday afternoon from 1:00 pm until 4:00 pm. You do not need an appointment for the interview!  [place]  [facility] [address], [place], [place] [phone]   [facility] Health Department  [phone]   [facility]  [phone]   [facility] Health Department  [phone]    Behavioral Health Resources in the Community: Intensive Outpatient Programs Organization         Address  Phone  Notes  [facility] [address] [place], [place] [phone]   [facility] Outpatient [address] [place], [place] [phone]   [facility] [address] [place], [place]  [phone]   [facility] Health [address],  [place], [place] [phone] or [phone]   Substance Abuse Resources Organization         Address  Phone  Notes  [facility] Drug Services  [phone]   [facility]  [phone]   [facility] [place]  [phone]   Floydene Flock  [phone]   Residential & Outpatient Substance Abuse Program  [phone]   Psychological Services Organization         Address  Phone  Notes  [facility] Behavioral Health  [phone]   [facility]  [phone]   [facility] Mental Health [address], [place] [phone] or [phone]    Mobile Crisis Teams Organization         Address  Phone  Notes  [facility], Mobile Crisis Care Unit  [phone]   [facility]  [address] [place], [place] [phone]   Doristine Locks [address] [place] [phone]    Self-Help/Support Groups Organization         Address  Phone             Notes  [facility]oc.  of [place] - variety of support groups  336- I7437963 Call for more information  Narcotics Anonymous (NA), [facility] [address], [employer]  2 meetings at this location   Statistician         Address  Phone  Notes  [facility] [address],    [place]  [phone]   [facility]  [address] [place] [address], [place], [place] [phone]   [facility] [address] [place], [place] [phone] Admissions: 8am-3pm M-F  [facility] [address]    [place], [place] [phone]   [facility] [address] [place], [place], [place] [phone]   [facility] [address],  [place], [place] [phone]   [facility] - Intensive Outpatient [address] Laurell Josephs [address], [place], [place] [phone]   [facility] ([facility]iction [facility] [address] [place]  [place], [place] [phone] or [phone]   [facility] ([facility]) [address] [place], [place] [phone] Accepts Medicaid  [facility] [place] [address]  [place] [phone] Substance Abuse/Addiction Treatment   [facility] Organization         Address  Phone  Notes  [facility]  [phone]   Angie Fava, PhD [address] Ervin Knack [place], [place]   [phone] or [phone]   [facility] Behavioral   [address] [place], [place] [phone]   [facility] [address] [place], [place] [phone] Insurance/Medicaid/sponsorship through [employer] and Families [address]  [place], [place] [phone] [place], [place] [phone]    Dr. Adele Schilder  [phone]   [facility] Dept. 1) [address] [place] 2)  [place] 3)  [place] [address], [place] [phone]  [phone]   [place] [phone] or [phone] (After Hours)

## 2014-03-04 LAB — GC/CHLAMYDIA PROBE AMP
CT Probe RNA: NEGATIVE
GC Probe RNA: NEGATIVE

## 2014-03-06 LAB — HERPES SIMPLEX VIRUS CULTURE: Culture: DETECTED

## 2014-03-06 NOTE — ED Provider Notes (Signed)
Medical screening examination/treatment/procedure(s) were performed by non-physician practitioner and as supervising physician I was immediately available for consultation/collaboration.   EKG Interpretation None        Zakariyah Freimark, MD 03/06/14 2111 

## 2014-03-07 ENCOUNTER — Telehealth (HOSPITAL_BASED_OUTPATIENT_CLINIC_OR_DEPARTMENT_OTHER): Payer: Self-pay | Admitting: Emergency Medicine

## 2014-03-07 NOTE — Telephone Encounter (Signed)
Post ED Visit - Positive Culture Follow-up  Culture report reviewed by antimicrobial stewardship pharmacist: []  Wes Dulaney, Pharm.D., BCPS [x]  Celedonio Miyamoto, Pharm.D., BCPS []  Georgina Pillion, Pharm.D., BCPS []  [place], [address].D., BCPS, AAHIVP []  Estella Husk, Pharm.D., BCPS, AAHIVP []  Harvie Junior, Pharm.D.  Positive Herpes culture Treated with Valtrex; please call and inform patient of +result  Zeb Comfort 03/07/2014, 1:09 PM

## 2014-07-31 ENCOUNTER — Encounter (HOSPITAL_COMMUNITY): Payer: Self-pay | Admitting: Emergency Medicine

## 2015-06-04 ENCOUNTER — Encounter (HOSPITAL_COMMUNITY): Payer: Self-pay | Admitting: *Deleted

## 2015-06-04 ENCOUNTER — Inpatient Hospital Stay (HOSPITAL_COMMUNITY)
Admission: AD | Admit: 2015-06-04 | Discharge: 2015-06-04 | Disposition: A | Discharge status: Home / Self Care | Payer: Medicaid Other | Source: Ambulatory Visit | Attending: Obstetrics & Gynecology | Admitting: Obstetrics & Gynecology

## 2015-06-04 ENCOUNTER — Inpatient Hospital Stay (HOSPITAL_COMMUNITY): Payer: Medicaid Other

## 2015-06-04 DIAGNOSIS — O30001 Twin pregnancy, unspecified number of placenta and unspecified number of amniotic sacs, first trimester: Secondary | ICD-10-CM

## 2015-06-04 DIAGNOSIS — Z3A01 Less than 8 weeks gestation of pregnancy: Secondary | ICD-10-CM | POA: Insufficient documentation

## 2015-06-04 DIAGNOSIS — Z3491 Encounter for supervision of normal pregnancy, unspecified, first trimester: Secondary | ICD-10-CM

## 2015-06-04 DIAGNOSIS — O26891 Other specified pregnancy related conditions, first trimester: Secondary | ICD-10-CM

## 2015-06-04 DIAGNOSIS — F419 Anxiety disorder, unspecified: Secondary | ICD-10-CM | POA: Insufficient documentation

## 2015-06-04 DIAGNOSIS — R519 Headache, unspecified: Secondary | ICD-10-CM

## 2015-06-04 DIAGNOSIS — O26899 Other specified pregnancy related conditions, unspecified trimester: Secondary | ICD-10-CM

## 2015-06-04 DIAGNOSIS — R51 Headache: Secondary | ICD-10-CM

## 2015-06-04 DIAGNOSIS — R109 Unspecified abdominal pain: Secondary | ICD-10-CM | POA: Insufficient documentation

## 2015-06-04 DIAGNOSIS — O2341 Unspecified infection of urinary tract in pregnancy, first trimester: Secondary | ICD-10-CM | POA: Insufficient documentation

## 2015-06-04 LAB — URINALYSIS, ROUTINE W REFLEX MICROSCOPIC
BILIRUBIN URINE: NEGATIVE
Glucose, UA: NEGATIVE mg/dL
HGB URINE DIPSTICK: NEGATIVE
Ketones, ur: 15 mg/dL — AB
LEUKOCYTES UA: NEGATIVE
Nitrite: POSITIVE — AB
PH: 7 (ref 5.0–8.0)
PROTEIN: NEGATIVE mg/dL
Specific Gravity, Urine: 1.015 (ref 1.005–1.030)
Urobilinogen, UA: 1 mg/dL (ref 0.0–1.0)

## 2015-06-04 LAB — COMPREHENSIVE METABOLIC PANEL
ALBUMIN: 4.6 g/dL (ref 3.5–5.0)
ALT: 29 U/L (ref 14–54)
AST: 33 U/L (ref 15–41)
Alkaline Phosphatase: 71 U/L (ref 38–126)
Anion gap: 12 (ref 5–15)
BUN: 5 mg/dL — ABNORMAL LOW (ref 6–20)
CO2: 24 mmol/L (ref 22–32)
Calcium: 10.2 mg/dL (ref 8.9–10.3)
Chloride: 101 mmol/L (ref 101–111)
Creatinine, Ser: 0.48 mg/dL (ref 0.44–1.00)
GFR calc Af Amer: >60 mL/min (ref >60)
GFR calc non Af Amer: >60 mL/min (ref >60)
GLUCOSE: 88 mg/dL (ref 65–99)
POTASSIUM: 3.8 mmol/L (ref 3.5–5.1)
Sodium: 137 mmol/L (ref 135–145)
TOTAL PROTEIN: 8.2 g/dL — AB (ref 6.5–8.1)
Total Bilirubin: 0.6 mg/dL (ref 0.3–1.2)

## 2015-06-04 LAB — CBC
HEMATOCRIT: 36.7 % (ref 36.0–46.0)
HEMOGLOBIN: 12.3 g/dL (ref 12.0–15.0)
MCH: 28.9 pg (ref 26.0–34.0)
MCHC: 33.5 g/dL (ref 30.0–36.0)
MCV: 86.4 fL (ref 78.0–100.0)
PLATELETS: 274 10*3/uL (ref 150–400)
RBC: 4.25 MIL/uL (ref 3.87–5.11)
RDW: 13.9 % (ref 11.5–15.5)
WBC: 8.9 10*3/uL (ref 4.0–10.5)

## 2015-06-04 LAB — LIPASE, BLOOD: LIPASE: 19 U/L — AB (ref 22–51)

## 2015-06-04 LAB — URINE MICROSCOPIC-ADD ON

## 2015-06-04 LAB — WET PREP, GENITAL
Trich, Wet Prep: NONE SEEN
Yeast Wet Prep HPF POC: NONE SEEN

## 2015-06-04 LAB — OB RESULTS CONSOLE GC/CHLAMYDIA: GC PROBE AMP, GENITAL: NEGATIVE

## 2015-06-04 LAB — HCG, QUANTITATIVE, PREGNANCY: hCG, Beta Chain, Quant, S: 79093 m[IU]/mL — ABNORMAL HIGH (ref <5)

## 2015-06-04 LAB — POCT PREGNANCY, URINE: Preg Test, Ur: POSITIVE — AB

## 2015-06-04 LAB — OB RESULTS CONSOLE HIV ANTIBODY (ROUTINE TESTING): HIV: NONREACTIVE

## 2015-06-04 MED ORDER — PROMETHAZINE HCL 25 MG PO TABS: 12.5000 mg | ORAL_TABLET | Freq: Four times a day (QID) | ORAL | Status: DC | PRN

## 2015-06-04 MED ORDER — CEPHALEXIN 500 MG PO CAPS: 500.0000 mg | ORAL_CAPSULE | Freq: Four times a day (QID) | ORAL | Status: DC

## 2015-06-04 MED ORDER — PROMETHAZINE HCL 25 MG/ML IJ SOLN
25.0000 mg | Freq: Once | INTRAMUSCULAR | Status: AC
  Administered 2015-06-04: 25 mg via INTRAVENOUS
  Filled 2015-06-04: qty 1

## 2015-06-04 MED ORDER — SODIUM CHLORIDE 0.9 % IV BOLUS (SEPSIS)
1000.0000 mL | Freq: Once | INTRAVENOUS | Status: AC
  Administered 2015-06-04: 1000 mL via INTRAVENOUS

## 2015-06-04 MED ORDER — LACTATED RINGERS IV SOLN: INTRAVENOUS | Status: DC

## 2015-06-04 MED ORDER — LACTATED RINGERS IV SOLN
INTRAVENOUS | Status: DC
  Administered 2015-06-04: 21:00:00 via INTRAVENOUS

## 2015-06-04 MED ORDER — PHENAZOPYRIDINE HCL 100 MG PO TABS
200.0000 mg | ORAL_TABLET | Freq: Once | ORAL | Status: AC
  Administered 2015-06-04: 200 mg via ORAL
  Filled 2015-06-04: qty 2

## 2015-06-04 MED ORDER — PHENAZOPYRIDINE HCL 200 MG PO TABS: 200.0000 mg | ORAL_TABLET | Freq: Three times a day (TID) | ORAL | Status: DC

## 2015-06-04 MED ORDER — DEXTROSE 5 % IV SOLN
2.0000 g | Freq: Once | INTRAVENOUS | Status: AC
  Administered 2015-06-04: 2 g via INTRAVENOUS
  Filled 2015-06-04: qty 2

## 2015-06-04 NOTE — MAU Provider Note (Signed)
History     CSN: [id number]  Arrival date and time: 06/04/15 1911   First Provider Initiated Contact with Patient 06/04/15 2036      Chief Complaint  Patient presents with  . Emesis  . Abdominal Pain  . Back Pain   Emesis  This is a new problem. The current episode started in the past 7 days. The problem occurs less than 2 times per day. The problem has been unchanged. The emesis has an appearance of stomach contents. There has been no fever. Associated symptoms include abdominal pain, chills, dizziness and headaches. Pertinent negatives include no diarrhea, fever or weight loss. She has tried nothing for the symptoms.  Abdominal Cramping This is a new problem. The current episode started in the past 7 days. The onset quality is gradual. The problem occurs intermittently. The problem has been waxing and waning. The pain is located in the LLQ and RLQ. The pain is mild. The quality of the pain is cramping. The abdominal pain does not radiate. Associated symptoms include headaches, nausea and vomiting. Pertinent negatives include no constipation, diarrhea, dysuria, fever, frequency or weight loss. She has tried nothing for the symptoms.  Headache  This is a new problem. The current episode started in the past 7 days. The problem occurs daily. The problem has been unchanged. The pain is located in the frontal and retro-orbital region. The pain does not radiate. The pain quality is similar to prior headaches. The quality of the pain is described as dull. The pain is moderate. Associated symptoms include abdominal pain, back pain, dizziness, nausea, photophobia and vomiting. Pertinent negatives include no blurred vision, eye watering, fever, neck pain, sinus pressure, visual change or weight loss. The symptoms are aggravated by bright light and activity. She has tried acetaminophen for the symptoms. The treatment provided no relief. Her past medical history is significant for migraine headaches.   Pt  is [id number] @[redacted]w[redacted]d  pregnant who presents for nausea and vomiting and abdominal cramping along with vaginal discharge- white/yellow without odor Pt has noticed her urine has an odor No fever, but has had headache for about 1 week all day long- frontal and behind eyes- photo sensitivity Pt felt dizzy/lightheadache yesterday blurry vision.  Past Medical History  Diagnosis Date  . Depression   . GERD (gastroesophageal reflux disease)     Past Surgical History  Procedure Laterality Date  . Dilation and curettage of uterus      Family History  Problem Relation Age of Onset  . Hypertension Maternal Grandmother   . Depression Maternal Grandmother   . Cancer Maternal Grandfather     lung    Social History  Substance Use Topics  . Smoking status: Current Every Day Smoker -- 1.00 packs/day for 6 years    Types: Cigarettes    Start date: 04/29/2007  . Smokeless tobacco: None     Comment: Declines help  . Alcohol Use: 0.0 oz/week     Comment: 1-2 cans every weekend    Allergies: No Known Allergies  Prescriptions prior to admission  Medication Sig Dispense Refill Last Dose  . Prenatal Vit-Fe Fumarate-FA (PRENATAL MULTIVITAMIN) TABS tablet Take 2 tablets by mouth daily at 12 noon.   Past Week at Unknown time  . [DISCONTINUED] cephALEXin (KEFLEX) 500 MG capsule Take 1 capsule (500 mg total) by mouth 2 (two) times daily. (Patient not taking: Reported on 06/04/2015) 20 capsule 0 Completed Course at Unknown time  . [DISCONTINUED] oxyCODONE-acetaminophen (PERCOCET/ROXICET) 5-325 MG per tablet Take 1-2  tablets by mouth every 4 (four) hours as needed for severe pain. (Patient not taking: Reported on 06/04/2015) 6 tablet 0 Completed Course at Unknown time    Review of Systems  Constitutional: Positive for chills. Negative for fever and weight loss.  HENT: Negative for sinus pressure.   Eyes: Positive for photophobia. Negative for blurred vision.  Gastrointestinal: Positive for nausea, vomiting and  abdominal pain. Negative for diarrhea and constipation.  Genitourinary: Negative for dysuria, urgency and frequency.  Musculoskeletal: Positive for back pain. Negative for neck pain.  Neurological: Positive for dizziness and headaches.   Physical Exam   Blood pressure 117/69, pulse 71, temperature 97.9 F (36.6 C), temperature source Oral, resp. rate 16, height  (1.549 m), weight 52.254 kg (115 lb 3.2 oz), last menstrual period 03/30/2015, SpO2 99 %.  Physical Exam  Nursing note and vitals reviewed. Constitutional: She is oriented to person, place, and time. She appears well-developed and well-nourished. No distress.  HENT:  Head: Normocephalic.  Eyes: Pupils are equal, round, and reactive to light.  Neck: Normal range of motion. Neck supple.  Cardiovascular: Normal rate.   Respiratory: Effort normal.  GI: Soft.  Genitourinary:  Mod amount of white frothy discharge in vault; cervix clean, NT; uterus soft, enlarged- suprapubic tenderness; adnexa without palpable enlargement or tenderness  Musculoskeletal: Normal range of motion.  Neurological: She is alert and oriented to person, place, and time.  Skin: Skin is warm and dry.  Psychiatric: She has a normal mood and affect.    MAU Course  Procedures Results for orders placed or performed during the hospital encounter of 06/04/15 (from the past 24 hour(s))  Urinalysis, Routine w reflex microscopic (not at [facility])     Status: Abnormal   Collection Time: 06/04/15  7:47 PM  Result Value Ref Range   Color, Urine YELLOW YELLOW   APPearance HAZY (A) CLEAR   Specific Gravity, Urine 1.015 1.005 - 1.030   pH 7.0 5.0 - 8.0   Glucose, UA NEGATIVE NEGATIVE mg/dL   Hgb urine dipstick NEGATIVE NEGATIVE   Bilirubin Urine NEGATIVE NEGATIVE   Ketones, ur 15 (A) NEGATIVE mg/dL   Protein, ur NEGATIVE NEGATIVE mg/dL   Urobilinogen, UA 1.0 0.0 - 1.0 mg/dL   Nitrite POSITIVE (A) NEGATIVE   Leukocytes, UA NEGATIVE NEGATIVE  Urine  microscopic-add on     Status: Abnormal   Collection Time: 06/04/15  7:47 PM  Result Value Ref Range   Squamous Epithelial / LPF FEW (A) RARE   WBC, UA 0-2 <3 WBC/hpf   RBC / HPF 0-2 <3 RBC/hpf   Bacteria, UA MANY (A) RARE  Pregnancy, urine POC     Status: Abnormal   Collection Time: 06/04/15  8:02 PM  Result Value Ref Range   Preg Test, Ur POSITIVE (A) NEGATIVE  Wet prep, genital     Status: Abnormal   Collection Time: 06/04/15  9:10 PM  Result Value Ref Range   Yeast Wet Prep HPF POC NONE SEEN NONE SEEN   Trich, Wet Prep NONE SEEN NONE SEEN   Clue Cells Wet Prep HPF POC MODERATE (A) NONE SEEN   WBC, Wet Prep HPF POC FEW (A) NONE SEEN  CBC     Status: None   Collection Time: 06/04/15  9:20 PM  Result Value Ref Range   WBC 8.9 4.0 - 10.5 K/uL   RBC 4.25 3.87 - 5.11 MIL/uL   Hemoglobin 12.3 12.0 - 15.0 g/dL   HCT 16.1 09.6 - 04.5 %  MCV 86.4 78.0 - 100.0 fL   MCH 28.9 26.0 - 34.0 pg   MCHC 33.5 30.0 - 36.0 g/dL   RDW 16.1 09.6 - 04.5 %   Platelets 274 150 - 400 K/uL  hCG, quantitative, pregnancy     Status: Abnormal   Collection Time: 06/04/15  9:20 PM  Result Value Ref Range   hCG, Beta Chain, Sharene Butters, S 40981 (H) <5 mIU/mL  Comprehensive metabolic panel     Status: Abnormal   Collection Time: 06/04/15  9:20 PM  Result Value Ref Range   Sodium 137 135 - 145 mmol/L   Potassium 3.8 3.5 - 5.1 mmol/L   Chloride 101 101 - 111 mmol/L   CO2 24 22 - 32 mmol/L   Glucose, Bld 88 65 - 99 mg/dL   BUN 5 (L) 6 - 20 mg/dL   Creatinine, Ser 1.91 0.44 - 1.00 mg/dL   Calcium 47.8 8.9 - 29.5 mg/dL   Total Protein 8.2 (H) 6.5 - 8.1 g/dL   Albumin 4.6 3.5 - 5.0 g/dL   AST 33 15 - 41 U/L   ALT 29 14 - 54 U/L   Alkaline Phosphatase 71 38 - 126 U/L   Total Bilirubin 0.6 0.3 - 1.2 mg/dL   GFR calc non Af Amer >60 >60 mL/min   GFR calc Af Amer >60 >60 mL/min   Anion gap 12 5 - 15  Lipase, blood     Status: Abnormal   Collection Time: 06/04/15  9:20 PM  Result Value Ref Range   Lipase 19  (L) 22 - 51 U/L  GC/chlamydia pending Urine culture pending Care turned over to Sharen Counter, CNM Assessment and Plan  UTI in pregnancy anxiety abd pain in pregnancy  LEFTWICH-KIRBY, Taylor Spilde 06/04/2015, 11:02 PM   US Ob Comp Less 14 Wks  06/04/2015   CLINICAL DATA:  Pelvic pain, cramps, and back pain for 1 month. Diagnosed with urinary tract infection tonight. Nausea and vomiting. Estimated gestational age by LMP is 9 weeks 3 days. Quantitative beta HCG is not recorded.  EXAM: TWIN OBSTETRIC <14WK [place] AND TRANSVAGINAL OB US  COMPARISON:  None.  FINDINGS: Twin intrauterine gestational is demonstrated. Two discrete gestational sacs are present.  TWIN 1  Intrauterine gestational sac: Visualized/normal in shape.  Yolk sac:  Yolk sac is present.  Embryo:  Fetal pole is present.  Cardiac Activity: Fetal cardiac activity is observed.  Heart Rate: 100 bpm  CRL:  3.2  mm   5 w 6 d                  [place] EDC: 01/29/2016  TWIN 2  Intrauterine gestational sac: Visualized/normal in shape.  Yolk sac:  Yolk sac is present.  Embryo:  Fetal pole is present.  Cardiac Activity: Fetal cardiac activity is observed.  Heart Rate: 112 bpm  CRL:  3.1  mm   5 w 6 d                  [place] EDC: 01/29/2016  Maternal uterus/adnexae: Uterus is anteverted. No myometrial mass lesions identified. Suggestion of a small subchorionic hemorrhage. Both ovaries are visualized. Right ovary demonstrates cystic structures likely representing two separate corpus luteal cysts. No abnormal adnexal masses are seen. Minimal free fluid in the pelvis.  IMPRESSION: Twin intrauterine gestations. Fetal crown-rump length measurements of both twin 1 and twin 2 are consistent with estimated gestational age of [redacted] weeks 6 days. Small subchorionic hemorrhage is present.  Electronically Signed   By: Burman Nieves M.D.   On: 06/04/2015 22:40   US Ob Comp Addl Gest Less 14 Wks  06/04/2015   CLINICAL DATA:  Pelvic pain, cramps, and back pain for 1 month. Diagnosed  with urinary tract infection tonight. Nausea and vomiting. Estimated gestational age by LMP is 9 weeks 3 days. Quantitative beta HCG is not recorded.  EXAM: TWIN OBSTETRIC <14WK [place] AND TRANSVAGINAL OB US  COMPARISON:  None.  FINDINGS: Twin intrauterine gestational is demonstrated. Two discrete gestational sacs are present.  TWIN 1  Intrauterine gestational sac: Visualized/normal in shape.  Yolk sac:  Yolk sac is present.  Embryo:  Fetal pole is present.  Cardiac Activity: Fetal cardiac activity is observed.  Heart Rate: 100 bpm  CRL:  3.2  mm   5 w 6 d                  [place] EDC: 01/29/2016  TWIN 2  Intrauterine gestational sac: Visualized/normal in shape.  Yolk sac:  Yolk sac is present.  Embryo:  Fetal pole is present.  Cardiac Activity: Fetal cardiac activity is observed.  Heart Rate: 112 bpm  CRL:  3.1  mm   5 w 6 d                  [place] EDC: 01/29/2016  Maternal uterus/adnexae: Uterus is anteverted. No myometrial mass lesions identified. Suggestion of a small subchorionic hemorrhage. Both ovaries are visualized. Right ovary demonstrates cystic structures likely representing two separate corpus luteal cysts. No abnormal adnexal masses are seen. Minimal free fluid in the pelvis.  IMPRESSION: Twin intrauterine gestations. Fetal crown-rump length measurements of both twin 1 and twin 2 are consistent with estimated gestational age of [redacted] weeks 6 days. Small subchorionic hemorrhage is present.   Electronically Signed   By: Burman Nieves M.D.   On: 06/04/2015 22:40   US Ob Transvaginal  06/04/2015   CLINICAL DATA:  Pelvic pain, cramps, and back pain for 1 month. Diagnosed with urinary tract infection tonight. Nausea and vomiting. Estimated gestational age by LMP is 9 weeks 3 days. Quantitative beta HCG is not recorded.  EXAM: TWIN OBSTETRIC <14WK [place] AND TRANSVAGINAL OB US  COMPARISON:  None.  FINDINGS: Twin intrauterine gestational is demonstrated. Two discrete gestational sacs are present.  TWIN 1  Intrauterine  gestational sac: Visualized/normal in shape.  Yolk sac:  Yolk sac is present.  Embryo:  Fetal pole is present.  Cardiac Activity: Fetal cardiac activity is observed.  Heart Rate: 100 bpm  CRL:  3.2  mm   5 w 6 d                  [place] EDC: 01/29/2016  TWIN 2  Intrauterine gestational sac: Visualized/normal in shape.  Yolk sac:  Yolk sac is present.  Embryo:  Fetal pole is present.  Cardiac Activity: Fetal cardiac activity is observed.  Heart Rate: 112 bpm  CRL:  3.1  mm   5 w 6 d                  [place] EDC: 01/29/2016  Maternal uterus/adnexae: Uterus is anteverted. No myometrial mass lesions identified. Suggestion of a small subchorionic hemorrhage. Both ovaries are visualized. Right ovary demonstrates cystic structures likely representing two separate corpus luteal cysts. No abnormal adnexal masses are seen. Minimal free fluid in the pelvis.  IMPRESSION: Twin intrauterine gestations. Fetal crown-rump length measurements of both twin 1 and twin  2 are consistent with estimated gestational age of [redacted] weeks 6 days. Small subchorionic hemorrhage is present.   Electronically Signed   By: Burman Nieves M.D.   On: 06/04/2015 22:40   MDM U/A, CBC, quant hcg, CMP, Lipase, OB U/S IV Rocephin 2g x 1 dose in MAU LR x 1000 ml, then NS x 1000 with Rocephin Pyridium 200 mg PO Phenergan 25 mg IV  A: 1. UTI in pregnancy, antepartum, first trimester   2. Abdominal pain in pregnancy   3. Normal IUP (intrauterine pregnancy) on prenatal ultrasound, first trimester   4. Twin pregnancy in first trimester   5. Headache in pregnancy, antepartum, first trimester    P: D/C home Keflex 500 mg QID x 7 days Pyridium 200 mg TID PRN Phenergan 12.5-25 mg PO Q 6 hours. Discussed OTC medications for nausea including Unisom/Vitamin B6. Message sent to [facility] for care Increase PO fluids, Tylenol for h/a Return to MAU as needed for emergencies  LEFTWICH-KIRBY, Assunta Pupo, CNM 11:02 PM

## 2015-06-04 NOTE — MAU Note (Signed)
Pt states that she had a +UPT at home. States that she has had some n/v and abdominal pain for 1 month. States that she has phenergan and zofran at home that she tried to take around 11am. States that she has not been able to keep anything down today. States that the medication usually helps but today it has not. Denies vag bleeding, but has some normal discharge. LMP: does not remember.

## 2015-06-05 LAB — HIV ANTIBODY (ROUTINE TESTING W REFLEX): HIV Screen 4th Generation wRfx: NONREACTIVE

## 2015-06-05 LAB — GC/CHLAMYDIA PROBE AMP (~~LOC~~) NOT AT ARMC
Chlamydia: NEGATIVE
Neisseria Gonorrhea: NEGATIVE

## 2015-06-05 LAB — RPR: RPR: NONREACTIVE

## 2015-06-07 LAB — CULTURE, OB URINE

## 2015-06-12 ENCOUNTER — Encounter (HOSPITAL_COMMUNITY): Payer: Self-pay

## 2015-06-12 ENCOUNTER — Inpatient Hospital Stay (HOSPITAL_COMMUNITY)
Admission: AD | Admit: 2015-06-12 | Discharge: 2015-06-12 | Disposition: A | Discharge status: Home / Self Care | Payer: Medicaid Other | Source: Ambulatory Visit | Attending: Obstetrics & Gynecology | Admitting: Obstetrics & Gynecology

## 2015-06-12 DIAGNOSIS — O99331 Smoking (tobacco) complicating pregnancy, first trimester: Secondary | ICD-10-CM | POA: Insufficient documentation

## 2015-06-12 DIAGNOSIS — O2341 Unspecified infection of urinary tract in pregnancy, first trimester: Secondary | ICD-10-CM

## 2015-06-12 DIAGNOSIS — Z3A01 Less than 8 weeks gestation of pregnancy: Secondary | ICD-10-CM | POA: Insufficient documentation

## 2015-06-12 DIAGNOSIS — M549 Dorsalgia, unspecified: Secondary | ICD-10-CM | POA: Insufficient documentation

## 2015-06-12 DIAGNOSIS — R102 Pelvic and perineal pain: Secondary | ICD-10-CM | POA: Diagnosis not present

## 2015-06-12 DIAGNOSIS — O30001 Twin pregnancy, unspecified number of placenta and unspecified number of amniotic sacs, first trimester: Secondary | ICD-10-CM | POA: Insufficient documentation

## 2015-06-12 DIAGNOSIS — R112 Nausea with vomiting, unspecified: Secondary | ICD-10-CM | POA: Diagnosis not present

## 2015-06-12 DIAGNOSIS — O26891 Other specified pregnancy related conditions, first trimester: Secondary | ICD-10-CM | POA: Diagnosis not present

## 2015-06-12 DIAGNOSIS — O26899 Other specified pregnancy related conditions, unspecified trimester: Secondary | ICD-10-CM

## 2015-06-12 DIAGNOSIS — O219 Vomiting of pregnancy, unspecified: Secondary | ICD-10-CM

## 2015-06-12 DIAGNOSIS — R51 Headache: Secondary | ICD-10-CM | POA: Diagnosis present

## 2015-06-12 DIAGNOSIS — R109 Unspecified abdominal pain: Secondary | ICD-10-CM

## 2015-06-12 LAB — URINALYSIS, ROUTINE W REFLEX MICROSCOPIC
Bilirubin Urine: NEGATIVE
Glucose, UA: NEGATIVE mg/dL
Hgb urine dipstick: NEGATIVE
KETONES UR: NEGATIVE mg/dL
LEUKOCYTES UA: NEGATIVE
NITRITE: POSITIVE — AB
PH: 8 (ref 5.0–8.0)
Protein, ur: NEGATIVE mg/dL
SPECIFIC GRAVITY, URINE: 1.02 (ref 1.005–1.030)
Urobilinogen, UA: 0.2 mg/dL (ref 0.0–1.0)

## 2015-06-12 LAB — URINE MICROSCOPIC-ADD ON

## 2015-06-12 MED ORDER — OXYCODONE-ACETAMINOPHEN 5-325 MG PO TABS
1.0000 | ORAL_TABLET | Freq: Once | ORAL | Status: AC
  Administered 2015-06-12: 1 via ORAL
  Filled 2015-06-12: qty 1

## 2015-06-12 MED ORDER — OXYCODONE-ACETAMINOPHEN 5-325 MG PO TABS: 2.0000 | ORAL_TABLET | ORAL | Status: DC | PRN

## 2015-06-12 MED ORDER — METOCLOPRAMIDE HCL 10 MG PO TABS
10.0000 mg | ORAL_TABLET | Freq: Once | ORAL | Status: AC
Start: 2015-06-12 — End: 2015-06-12
  Administered 2015-06-12: 10 mg via ORAL
  Filled 2015-06-12: qty 1

## 2015-06-12 MED ORDER — DEXTROSE 5 % IV SOLN
2.0000 g | Freq: Once | INTRAVENOUS | Status: AC
  Administered 2015-06-12: 2 g via INTRAVENOUS
  Filled 2015-06-12: qty 2

## 2015-06-12 MED ORDER — MECLIZINE HCL 25 MG PO TABS: 25.0000 mg | ORAL_TABLET | Freq: Three times a day (TID) | ORAL | Status: DC | PRN

## 2015-06-12 MED ORDER — METOCLOPRAMIDE HCL 10 MG PO TABS: ORAL_TABLET | ORAL | Status: DC

## 2015-06-12 MED ORDER — ONDANSETRON 8 MG PO TBDP
8.0000 mg | ORAL_TABLET | Freq: Once | ORAL | Status: AC
  Administered 2015-06-12: 8 mg via ORAL
  Filled 2015-06-12: qty 1

## 2015-06-12 NOTE — Discharge Instructions (Signed)
Abdominal Pain During Pregnancy °Abdominal pain is common in pregnancy. Most of the time, it does not cause harm. There are many causes of abdominal pain. Some causes are more serious than others. Some of the causes of abdominal pain in pregnancy are easily diagnosed. Occasionally, the diagnosis takes time to understand. Other times, the cause is not determined. Abdominal pain can be a sign that something is very wrong with the pregnancy, or the pain may have nothing to do with the pregnancy at all. For this reason, always tell your health care provider if you have any abdominal discomfort. °HOME CARE INSTRUCTIONS  °Monitor your abdominal pain for any changes. The following actions may help to alleviate any discomfort you are experiencing: °· Do not have sexual intercourse or put anything in your vagina until your symptoms go away completely. °· Get plenty of rest until your pain improves. °· Drink clear fluids if you feel nauseous. Avoid solid food as long as you are uncomfortable or nauseous. °· Only take over-the-counter or prescription medicine as directed by your health care provider. °· Keep all follow-up appointments with your health care provider. °SEEK IMMEDIATE MEDICAL CARE IF: °· You are bleeding, leaking fluid, or passing tissue from the vagina. °· You have increasing pain or cramping. °· You have persistent vomiting. °· You have painful or bloody urination. °· You have a fever. °· You notice a decrease in your baby's movements. °· You have extreme weakness or feel faint. °· You have shortness of breath, with or without abdominal pain. °· You develop a severe headache with abdominal pain. °· You have abnormal vaginal discharge with abdominal pain. °· You have persistent diarrhea. °· You have abdominal pain that continues even after rest, or gets worse. °MAKE SURE YOU:  °· Understand these instructions. °· Will watch your condition. °· Will get help right away if you are not doing well or get  worse. °Document Released: 09/15/2005 Document Revised: 07/06/2013 Document Reviewed: 04/14/2013 °[employer]® Patient Information ©2015 [employer]. This information is not intended to replace advice given to you by your health care provider. Make sure you discuss any questions you have with your health care provider. °Safe Medications in Pregnancy  ° °Acne: °Benzoyl Peroxide °Salicylic Acid ° °Backache/Headache: °Tylenol: 2 regular strength every 4 hours OR °             2 Extra strength every 6 hours ° °Colds/Coughs/Allergies: °Benadryl (alcohol free) 25 mg every 6 hours as needed °Breath right strips °Claritin °Cepacol throat lozenges °Chloraseptic throat spray °Cold-Eeze- up to three times per day °Cough drops, alcohol free °Flonase (by prescription only) °Guaifenesin °Mucinex °Robitussin DM (plain only, alcohol free) °Saline nasal spray/drops °Sudafed (pseudoephedrine) & Actifed ** use only after [redacted] weeks gestation and if you do not have high blood pressure °Tylenol °Vicks Vaporub °Zinc lozenges °Zyrtec  ° °Constipation: °Colace °Ducolax suppositories °Fleet enema °Glycerin suppositories °Metamucil °Milk of magnesia °Miralax °Senokot °Smooth move tea ° °Diarrhea: °Kaopectate °Imodium A-D ° °*NO pepto Bismol ° °Hemorrhoids: °Anusol °Anusol HC °Preparation H °Tucks ° °Indigestion: °Tums °Maalox °Mylanta °Zantac  °Pepcid ° °Insomnia: °Benadryl (alcohol free) 25mg every 6 hours as needed °Tylenol PM °Unisom, no Gelcaps ° °Leg Cramps: °Tums °MagGel ° °Nausea/Vomiting:  °Bonine °Dramamine °Emetrol °Ginger extract °Sea bands °Meclizine  °Nausea medication to take during pregnancy:  °Unisom (doxylamine succinate 25 mg tablets) Take one tablet daily at bedtime. If symptoms are not adequately controlled, the dose can be increased to a maximum recommended dose of two   tablets daily (1/2 tablet in the morning, 1/2 tablet mid-afternoon and one at bedtime). °Vitamin B6 100mg tablets. Take one tablet twice a day (up to 200 mg  per day). ° °Skin Rashes: °Aveeno products °Benadryl cream or 25mg every 6 hours as needed °Calamine Lotion °1% cortisone cream ° °Yeast infection: °Gyne-lotrimin 7 °Monistat 7 ° ° °**If taking multiple medications, please check labels to avoid duplicating the same active ingredients °**take medication as directed on the label °** Do not exceed 4000 mg of tylenol in 24 hours °**Do not take medications that contain aspirin or ibuprofen ° ° °

## 2015-06-12 NOTE — MAU Provider Note (Signed)
MAU HISTORY AND PHYSICAL  Chief Complaint:  Abdominal Pain and Back Pain   April [name] is a 24 y.o.  [id number] with IUP at [redacted]w[redacted]d presenting for   Twin pregnancy, smoker.  Presented here 9/5 complaining of HA and back pain and abdominal pain. Vomiting and nauseaus. Found to have e coli uti, pan-sensitive, started on keflex. Taking that medication. Taking as prescribed but has vomited it back up. Phenergan not helping.   Today having suprapubic abdominal pressure and back pain. No fevers.     Past Medical History  Diagnosis Date  . Depression   . GERD (gastroesophageal reflux disease)     Past Surgical History  Procedure Laterality Date  . Dilation and curettage of uterus      Family History  Problem Relation Age of Onset  . Hypertension Maternal Grandmother   . Depression Maternal Grandmother   . Cancer Maternal Grandfather     lung    Social History  Substance Use Topics  . Smoking status: Current Every Day Smoker -- 1.00 packs/day for 6 years    Types: Cigarettes    Start date: 04/29/2007  . Smokeless tobacco: None     Comment: Declines help  . Alcohol Use: No    No Known Allergies  Prescriptions prior to admission  Medication Sig Dispense Refill Last Dose  . cephALEXin (KEFLEX) 500 MG capsule Take 1 capsule (500 mg total) by mouth 4 (four) times daily. 28 capsule 0   . phenazopyridine (PYRIDIUM) 200 MG tablet Take 1 tablet (200 mg total) by mouth 3 (three) times daily. 12 tablet 0   . Prenatal Vit-Fe Fumarate-FA (PRENATAL MULTIVITAMIN) TABS tablet Take 2 tablets by mouth daily at 12 noon.   Past Week at Unknown time  . promethazine (PHENERGAN) 25 MG tablet Take 0.5-1 tablets (12.5-25 mg total) by mouth every 6 (six) hours as needed. 30 tablet 2     Review of Systems - Negative except for what is mentioned in HPI.  Physical Exam  Blood pressure 103/81, pulse 103, temperature 98.2 F (36.8 C), temperature source Oral, resp. rate 18, height 5\' 1"  (1.549 m),  weight 115 lb (52.164 kg), last menstrual period 03/30/2015. GENERAL: Well-developed, well-nourished female in no acute distress.  LUNGS: Clear to auscultation bilaterally.  HEART: Regular rate and rhythm. ABDOMEN: Soft, mild ttp suprapubically, nondistended, gravid.  EXTREMITIES: Nontender, no edema, 2+ distal pulses. BACK: no cva tenderness, diffuse mild TTP lower back    Labs: Results for orders placed or performed during the hospital encounter of 06/12/15 (from the past 24 hour(s))  Urinalysis, Routine w reflex microscopic (not at [facility])   Collection Time: 06/12/15  6:05 PM  Result Value Ref Range   Color, Urine YELLOW YELLOW   APPearance CLEAR CLEAR   Specific Gravity, Urine 1.020 1.005 - 1.030   pH 8.0 5.0 - 8.0   Glucose, UA NEGATIVE NEGATIVE mg/dL   Hgb urine dipstick NEGATIVE NEGATIVE   Bilirubin Urine NEGATIVE NEGATIVE   Ketones, ur NEGATIVE NEGATIVE mg/dL   Protein, ur NEGATIVE NEGATIVE mg/dL   Urobilinogen, UA 0.2 0.0 - 1.0 mg/dL   Nitrite POSITIVE (A) NEGATIVE   Leukocytes, UA NEGATIVE NEGATIVE  Urine microscopic-add on   Collection Time: 06/12/15  6:05 PM  Result Value Ref Range   Squamous Epithelial / LPF MANY (A) RARE   WBC, UA 0-2 <3 WBC/hpf   RBC / HPF 3-6 <3 RBC/hpf   Bacteria, UA FEW (A) RARE    Imaging Studies:  US  Ob Comp Less 14 Wks  06/04/2015   CLINICAL DATA:  Pelvic pain, cramps, and back pain for 1 month. Diagnosed with urinary tract infection tonight. Nausea and vomiting. Estimated gestational age by LMP is 9 weeks 3 days. Quantitative beta HCG is not recorded.  EXAM: TWIN OBSTETRIC <14WK [place] AND TRANSVAGINAL OB US  COMPARISON:  None.  FINDINGS: Twin intrauterine gestational is demonstrated. Two discrete gestational sacs are present.  TWIN 1  Intrauterine gestational sac: Visualized/normal in shape.  Yolk sac:  Yolk sac is present.  Embryo:  Fetal pole is present.  Cardiac Activity: Fetal cardiac activity is observed.  Heart Rate: 100 bpm  CRL:  3.2  mm    5 w 6 d                  [place] EDC: 01/29/2016  TWIN 2  Intrauterine gestational sac: Visualized/normal in shape.  Yolk sac:  Yolk sac is present.  Embryo:  Fetal pole is present.  Cardiac Activity: Fetal cardiac activity is observed.  Heart Rate: 112 bpm  CRL:  3.1  mm   5 w 6 d                  [place] EDC: 01/29/2016  Maternal uterus/adnexae: Uterus is anteverted. No myometrial mass lesions identified. Suggestion of a small subchorionic hemorrhage. Both ovaries are visualized. Right ovary demonstrates cystic structures likely representing two separate corpus luteal cysts. No abnormal adnexal masses are seen. Minimal free fluid in the pelvis.  IMPRESSION: Twin intrauterine gestations. Fetal crown-rump length measurements of both twin 1 and twin 2 are consistent with estimated gestational age of [redacted] weeks 6 days. Small subchorionic hemorrhage is present.   Electronically Signed   By: Burman Nieves M.D.   On: 06/04/2015 22:40   US Ob Comp Addl Gest Less 14 Wks  06/04/2015   CLINICAL DATA:  Pelvic pain, cramps, and back pain for 1 month. Diagnosed with urinary tract infection tonight. Nausea and vomiting. Estimated gestational age by LMP is 9 weeks 3 days. Quantitative beta HCG is not recorded.  EXAM: TWIN OBSTETRIC <14WK [place] AND TRANSVAGINAL OB US  COMPARISON:  None.  FINDINGS: Twin intrauterine gestational is demonstrated. Two discrete gestational sacs are present.  TWIN 1  Intrauterine gestational sac: Visualized/normal in shape.  Yolk sac:  Yolk sac is present.  Embryo:  Fetal pole is present.  Cardiac Activity: Fetal cardiac activity is observed.  Heart Rate: 100 bpm  CRL:  3.2  mm   5 w 6 d                  [place] EDC: 01/29/2016  TWIN 2  Intrauterine gestational sac: Visualized/normal in shape.  Yolk sac:  Yolk sac is present.  Embryo:  Fetal pole is present.  Cardiac Activity: Fetal cardiac activity is observed.  Heart Rate: 112 bpm  CRL:  3.1  mm   5 w 6 d                  [place] EDC: 01/29/2016  Maternal uterus/adnexae:  Uterus is anteverted. No myometrial mass lesions identified. Suggestion of a small subchorionic hemorrhage. Both ovaries are visualized. Right ovary demonstrates cystic structures likely representing two separate corpus luteal cysts. No abnormal adnexal masses are seen. Minimal free fluid in the pelvis.  IMPRESSION: Twin intrauterine gestations. Fetal crown-rump length measurements of both twin 1 and twin 2 are consistent with estimated gestational age of [redacted] weeks 6 days. Small subchorionic  hemorrhage is present.   Electronically Signed   By: Burman Nieves M.D.   On: 06/04/2015 22:40   US Ob Transvaginal  06/04/2015   CLINICAL DATA:  Pelvic pain, cramps, and back pain for 1 month. Diagnosed with urinary tract infection tonight. Nausea and vomiting. Estimated gestational age by LMP is 9 weeks 3 days. Quantitative beta HCG is not recorded.  EXAM: TWIN OBSTETRIC <14WK [place] AND TRANSVAGINAL OB US  COMPARISON:  None.  FINDINGS: Twin intrauterine gestational is demonstrated. Two discrete gestational sacs are present.  TWIN 1  Intrauterine gestational sac: Visualized/normal in shape.  Yolk sac:  Yolk sac is present.  Embryo:  Fetal pole is present.  Cardiac Activity: Fetal cardiac activity is observed.  Heart Rate: 100 bpm  CRL:  3.2  mm   5 w 6 d                  [place] EDC: 01/29/2016  TWIN 2  Intrauterine gestational sac: Visualized/normal in shape.  Yolk sac:  Yolk sac is present.  Embryo:  Fetal pole is present.  Cardiac Activity: Fetal cardiac activity is observed.  Heart Rate: 112 bpm  CRL:  3.1  mm   5 w 6 d                  [place] EDC: 01/29/2016  Maternal uterus/adnexae: Uterus is anteverted. No myometrial mass lesions identified. Suggestion of a small subchorionic hemorrhage. Both ovaries are visualized. Right ovary demonstrates cystic structures likely representing two separate corpus luteal cysts. No abnormal adnexal masses are seen. Minimal free fluid in the pelvis.  IMPRESSION: Twin intrauterine gestations. Fetal  crown-rump length measurements of both twin 1 and twin 2 are consistent with estimated gestational age of [redacted] weeks 6 days. Small subchorionic hemorrhage is present.   Electronically Signed   By: Burman Nieves M.D.   On: 06/04/2015 22:40   Care assumed from Shonna Chock, MD Another dose of Rocephin 2 gm IV given Another urine culture and sensitivity perfomed Discussed nausea and vomiting and medications as well as UTI Advised pt to use zofran as last results; pt states that phenergan makes her "crazy" so pt is reluctant to use Assessment: RASA DEGRAZIA is  24 y.o. [id number] at [redacted]w[redacted]d presents with acute cystitis. Has been unable to tolerate keflex 2/2 nausea and vomiting and today urine shows signs persistent infection; culture on 9/5 grew e coli sensitive to cephalosporins. I think this represents inadequate.  Plan: Twin pregnancy 7weeks Persistent UTI in pregnancy- keflex Rx- repeat urine culture pending Nausea and vomiting in pregnancy- Rx Reglan, antivert List of medications safe in pregnancy Suprapubic pain and back pain in pregnancy- percocet Rx F/u with [facility] Jean Rosenthal, NP Silvano Bilis 9/13/20167:26 PM

## 2015-06-12 NOTE — MAU Note (Addendum)
Pt came in my EMS with C/O lower abd cramping & pressure, also back pain started around 1515 today.  Has been on antibiotics for UTI, unable to hold meds down.  Denies bleeding, has some yellow vaginal discharge.  Has appt in clinic 9/29.

## 2015-06-14 LAB — CULTURE, OB URINE
Culture: 50000
SPECIAL REQUESTS: NORMAL

## 2015-06-28 ENCOUNTER — Encounter: Payer: Self-pay | Admitting: Obstetrics & Gynecology

## 2015-06-28 ENCOUNTER — Other Ambulatory Visit (HOSPITAL_COMMUNITY)
Admission: RE | Admit: 2015-06-28 | Discharge: 2015-06-28 | Disposition: A | Discharge status: Home / Self Care | Payer: Medicaid Other | Source: Ambulatory Visit | Attending: Obstetrics & Gynecology | Admitting: Obstetrics & Gynecology

## 2015-06-28 ENCOUNTER — Ambulatory Visit (INDEPENDENT_AMBULATORY_CARE_PROVIDER_SITE_OTHER): Payer: Self-pay | Admitting: Obstetrics & Gynecology

## 2015-06-28 VITALS — BP 113/62 | HR 86 | Temp 97.9°F | Wt 114.2 lb

## 2015-06-28 DIAGNOSIS — Z1151 Encounter for screening for human papillomavirus (HPV): Secondary | ICD-10-CM | POA: Insufficient documentation

## 2015-06-28 DIAGNOSIS — Z113 Encounter for screening for infections with a predominantly sexual mode of transmission: Secondary | ICD-10-CM | POA: Insufficient documentation

## 2015-06-28 DIAGNOSIS — O30003 Twin pregnancy, unspecified number of placenta and unspecified number of amniotic sacs, third trimester: Secondary | ICD-10-CM

## 2015-06-28 DIAGNOSIS — Z01411 Encounter for gynecological examination (general) (routine) with abnormal findings: Secondary | ICD-10-CM | POA: Insufficient documentation

## 2015-06-28 DIAGNOSIS — Z124 Encounter for screening for malignant neoplasm of cervix: Secondary | ICD-10-CM

## 2015-06-28 DIAGNOSIS — O30009 Twin pregnancy, unspecified number of placenta and unspecified number of amniotic sacs, unspecified trimester: Secondary | ICD-10-CM | POA: Insufficient documentation

## 2015-06-28 DIAGNOSIS — Z3491 Encounter for supervision of normal pregnancy, unspecified, first trimester: Secondary | ICD-10-CM

## 2015-06-28 DIAGNOSIS — Z118 Encounter for screening for other infectious and parasitic diseases: Secondary | ICD-10-CM

## 2015-06-28 LAB — POCT URINALYSIS DIP (DEVICE)
Glucose, UA: NEGATIVE mg/dL
Hgb urine dipstick: NEGATIVE
KETONES UR: NEGATIVE mg/dL
Leukocytes, UA: NEGATIVE
Nitrite: NEGATIVE
PH: 6 (ref 5.0–8.0)
PROTEIN: NEGATIVE mg/dL
SPECIFIC GRAVITY, URINE: 1.025 (ref 1.005–1.030)
Urobilinogen, UA: 1 mg/dL (ref 0.0–1.0)

## 2015-06-28 LAB — HEPATITIS B SURFACE ANTIGEN: Hepatitis B Surface Ag: NEGATIVE

## 2015-06-28 NOTE — Progress Notes (Signed)
   Subjective:twins by 5.6.week [place]    April [name] is a [id number] [redacted]w[redacted]d being seen today for her first obstetrical visit.  Her obstetrical history is significant for twins. Patient does not intend to breast feed. Pregnancy history fully reviewed.  Patient reports heartburn and nausea.  Filed Vitals:   06/28/15 0906  BP: 113/62  Pulse: 86  Temp: 97.9 F (36.6 C)  Weight: 114 lb 3.2 oz (51.801 kg)    HISTORY: OB History  Gravida Para Term Preterm AB SAB TAB Ectopic Multiple Living  0 # Outcome Date GA Lbr Len/2nd Weight Sex Delivery Anes PTL Lv  4 Current           3 SAB 2012             Comments: no complications, not sure of gestation  2 Term 05/22/08 [redacted]w[redacted]d  6 lb (2.722 kg) M Vag-Spont EPI    1 TAB 2006             Past Medical History  Diagnosis Date  . Depression   . GERD (gastroesophageal reflux disease)   . UTI (urinary tract infection) in pregnancy in first trimester   . Herpes    Past Surgical History  Procedure Laterality Date  . Dilation and curettage of uterus     Family History  Problem Relation Age of Onset  . Hypertension Maternal Grandmother   . Depression Maternal Grandmother   . Cancer Maternal Grandfather     lung     Exam    Uterus:     Pelvic Exam:    Perineum: No Hemorrhoids   Vulva: normal   Vagina:  normal mucosa   pH:     Cervix: no lesions   Adnexa: not evaluated   Bony Pelvis: average  System: Breast:  normal appearance, no masses or tenderness   Skin: normal coloration and turgor, no rashes    Neurologic: oriented, normal mood   Extremities: normal strength, tone, and muscle mass   HEENT neck supple with midline trachea and thyroid without masses   Mouth/Teeth mucous membranes moist, pharynx normal without lesions and dental hygiene good   Neck supple   Cardiovascular: regular rate and rhythm, no murmurs or gallops   Respiratory:  appears well, vitals normal, no respiratory distress, acyanotic, normal  RR, neck free of mass or lymphadenopathy, chest clear, no wheezing, crepitations, rhonchi, normal symmetric air entry   Abdomen: soft, non-tender; bowel sounds normal; no masses,  no organomegaly   Urinary: urethral meatus normal      Assessment:    Pregnancy: [id number] Patient Active Problem List   Diagnosis Date Noted  . Twin pregnancy, antepartum condition or complication 06/28/2015  . Menorrhagia 07/07/2013  . Sleep difficulties 11/07/2011  . Depressive disorder 10/23/2011  . ABNORMAL VAGINAL BLEEDING 07/17/2008  . RH FACTOR, NEGATIVE 03/07/2008  . UTI'S, HX OF 03/07/2008  . TOBACCO ABUSE 10/27/2007        Plan:     Initial labs drawn. Prenatal vitamins. Problem list reviewed and updated. Genetic Screening discussed First Screen: ordered.  Ultrasound discussed; fetal survey: 18-20 weeks.  Follow up in 4 weeks. 50% of 30 min visit spent on counseling and coordination of care.  Patient asks to be excused from community service-to see SW   ARNOLD,JAMES 06/28/2015

## 2015-06-28 NOTE — Patient Instructions (Signed)
Multiple Pregnancy A multiple pregnancy is when a woman is pregnant with two or more fetuses. Multiple pregnancies occur in about 3% of all births. The more babies in a pregnancy, the greater the risks of problems to the babies and mother. This includes death. Since the use of Assisted Reproductive Technology (ART) and medications that can induce ovulation, multiple fetal gestation has increased.  RISKS TO THE MOTHER  Preeclampsia and eclampsia.  Postpartum bleeding (hemorrhage).  Kidney infection (pyelonephritis).  Develop anemia.  Develop diabetes.  Liver complications.  A blood clot blocks the artery, or branch of the artery leading to the lungs (pulmonary embolism).  Blood clots in the leg.  Placental separation.  Higher rate of Cesarean Section deliveries.  Women over 35 years old have a higher rate of Downs Syndrome babies. RISKS TO THE BABY  Preterm labor with a premature baby.  Very low birth weight babies that are less than 3 pounds, especially with triplets or mores.  Premature rupture of the membranes.  Twin to twin blood transfusion with one baby anemic and the other baby with too much blood in its system. There may also be heart failure.  With triplets or more, one of the babies is at high risk for cerebral palsy or other neurologic problem.  There is a higher incidence of fetal death. CARE OF MOTHERS WITH MULTIPLE FETAL GESTATION Multiple pregnancies need more care and special prenatal care.  You will see your caregiver more often.  You will have more tests including ultrasounds, nonstress tests and blood tests.  You will have special tests done called amniocentesis and a biophysical profile.  You may be hospitalized more often during the pregnancy.  You will be encouraged to eat a balanced and healthy diet with vitamin and mineral supplements as directed.  You will be asked to get more rest and sleep to keep up your energy.  You will be asked to  restrict your daily activities, exercise, work, household chores and sexual activity.  If you have preterm labor with small babies, you will be given a steroid injection to help the babies lungs mature and do better when born.  The delivery may have to be by Cesarean delivery, especially if there are triplets or more.  The delivery should be in a hospital with an intensive care nursery and Neonatologists (pediatrician for high risk babies) to care for the newborn babies. HOME CARE INSTRUCTIONS   Follow the caregiver's recommendations regarding office visits, tests for you and the babies, diet, rest and medications.  Avoid a large amount of physical activity.  Arrange to have help after the babies are born and when you go home from the hospital.  Take classes on how to care for multiple babies before you deliver them. SEEK IMMEDIATE MEDICAL CARE IF:   You develop a temperature of 102 F (38.9 C) or higher.  You are leaking fluid from the vagina.  You develop vaginal bleeding.  You develop uterine contractions.  You develop a severe headache, severe upper abdominal pain, visual problems or excessive swelling of your face, hands and feet.  You develop severe back pain or leg pain.  You develop severe tiredness.  You develop chest pain.  You have shortness of breath, fall down or pass out. Document Released: 06/24/2008 Document Revised: 12/08/2011 Document Reviewed: 08/19/2013 ExitCare Patient Information 2015 [employer]. This information is not intended to replace advice given to you by your health care provider. Make sure you discuss any questions you have with   your health care provider.  

## 2015-06-28 NOTE — Progress Notes (Signed)
Here for initial prenatal visit. Given new patient education booklets. C/o lower back pain =10. C/o rash   On sides of abdomen. C/o cramping at times. History of herpes- last outbreak 5 months before pregnancy. FHR auscultated at 167, unable to differentiate second fhr. Medicaid home form completed.

## 2015-06-28 NOTE — Progress Notes (Signed)
First trimester screen scheduled for 07/23/2015@ 10:00AM

## 2015-06-28 NOTE — Progress Notes (Signed)
Nutrition note: 1st visit consult  Pt is pregnant with twins. Pt has lost 5.8# @ [redacted]w[redacted]d & pt reports frequent N&V. Pt reports eating small amounts 5-6x/d but can't keep it all down. Pt reports she is not taking a PNV due to her N&V. Pt reports having heartburn as well & takes tums. NKFA. Pt received verbal & written education on nutrition during pregnancy with twins. Provided Rx for Ensure 3x/d through [facility] (Dr Debroah Loop signed Rx). Encouraged PNV or 2 chewable multivitamins when able to tolerate. Discussed importance/ benefits of BF. Discussed wt gain goals of 37-54# or 1.4#/wk in 2nd & 3rd trimester. Pt agrees to take a PNV or equivalent when able. Pt does not have WIC yet but plans to apply in [facility] soon. Pt does not plan to BF. F/u in 4-6 wks Blondell Reveal, MS, RD, LDN, [facility]

## 2015-06-29 LAB — GC/CHLAMYDIA PROBE AMP (~~LOC~~) NOT AT ARMC
Chlamydia: NEGATIVE
Neisseria Gonorrhea: NEGATIVE

## 2015-06-29 LAB — CYTOLOGY - PAP

## 2015-06-30 LAB — RUBELLA ANTIBODY, IGM: Rubella IgM: 0.49 (ref <0.91)

## 2015-07-03 LAB — PRESCRIPTION MONITORING PROFILE (19 PANEL)
AMPHETAMINE/METH: NEGATIVE ng/mL
Barbiturate Screen, Urine: NEGATIVE ng/mL
Benzodiazepine Screen, Urine: NEGATIVE ng/mL
Buprenorphine, Urine: NEGATIVE ng/mL
CARISOPRODOL, URINE: NEGATIVE ng/mL
Cocaine Metabolites: NEGATIVE ng/mL
Creatinine, Urine: 250.25 mg/dL (ref >20.0)
Fentanyl, Ur: NEGATIVE ng/mL
MDMA URINE: NEGATIVE ng/mL
MEPERIDINE UR: NEGATIVE ng/mL
METHADONE SCREEN, URINE: NEGATIVE ng/mL
METHAQUALONE SCREEN (URINE): NEGATIVE ng/mL
NITRITES URINE, INITIAL: NEGATIVE ug/mL
OXYCODONE SCRN UR: NEGATIVE ng/mL
Opiate Screen, Urine: NEGATIVE ng/mL
PROPOXYPHENE: NEGATIVE ng/mL
Phencyclidine, Ur: NEGATIVE ng/mL
TAPENTADOLUR: NEGATIVE ng/mL
TRAMADOL UR: NEGATIVE ng/mL
Zolpidem, Urine: NEGATIVE ng/mL
pH, Initial: 6.2 pH (ref 4.5–8.9)

## 2015-07-03 LAB — CANNABANOIDS (GC/LC/MS), URINE: THC-COOH UR CONFIRM: 868 ng/mL — AB (ref <5)

## 2015-07-16 ENCOUNTER — Inpatient Hospital Stay (HOSPITAL_COMMUNITY)
Admission: AD | Admit: 2015-07-16 | Discharge: 2015-07-16 | Disposition: A | Discharge status: Home / Self Care | Payer: Medicaid Other | Source: Ambulatory Visit | Attending: Family Medicine | Admitting: Family Medicine

## 2015-07-16 ENCOUNTER — Encounter (HOSPITAL_COMMUNITY): Payer: Self-pay

## 2015-07-16 ENCOUNTER — Inpatient Hospital Stay (HOSPITAL_COMMUNITY): Payer: Medicaid Other

## 2015-07-16 DIAGNOSIS — Z3A11 11 weeks gestation of pregnancy: Secondary | ICD-10-CM | POA: Insufficient documentation

## 2015-07-16 DIAGNOSIS — O9989 Other specified diseases and conditions complicating pregnancy, childbirth and the puerperium: Secondary | ICD-10-CM

## 2015-07-16 DIAGNOSIS — O26899 Other specified pregnancy related conditions, unspecified trimester: Secondary | ICD-10-CM

## 2015-07-16 DIAGNOSIS — O99331 Smoking (tobacco) complicating pregnancy, first trimester: Secondary | ICD-10-CM | POA: Diagnosis not present

## 2015-07-16 DIAGNOSIS — R109 Unspecified abdominal pain: Secondary | ICD-10-CM

## 2015-07-16 DIAGNOSIS — O26891 Other specified pregnancy related conditions, first trimester: Secondary | ICD-10-CM | POA: Diagnosis not present

## 2015-07-16 DIAGNOSIS — R102 Pelvic and perineal pain: Secondary | ICD-10-CM | POA: Insufficient documentation

## 2015-07-16 DIAGNOSIS — O219 Vomiting of pregnancy, unspecified: Secondary | ICD-10-CM | POA: Insufficient documentation

## 2015-07-16 DIAGNOSIS — O30091 Twin pregnancy, unable to determine number of placenta and number of amniotic sacs, first trimester: Secondary | ICD-10-CM | POA: Insufficient documentation

## 2015-07-16 DIAGNOSIS — F17210 Nicotine dependence, cigarettes, uncomplicated: Secondary | ICD-10-CM | POA: Diagnosis not present

## 2015-07-16 DIAGNOSIS — O360112 Maternal care for anti-D [Rh] antibodies, first trimester, fetus 2: Secondary | ICD-10-CM

## 2015-07-16 LAB — CBC
HCT: 31.9 % — ABNORMAL LOW (ref 36.0–46.0)
Hemoglobin: 10.8 g/dL — ABNORMAL LOW (ref 12.0–15.0)
MCH: 29 pg (ref 26.0–34.0)
MCHC: 33.9 g/dL (ref 30.0–36.0)
MCV: 85.5 fL (ref 78.0–100.0)
PLATELETS: 281 10*3/uL (ref 150–400)
RBC: 3.73 MIL/uL — AB (ref 3.87–5.11)
RDW: 14.3 % (ref 11.5–15.5)
WBC: 8.9 10*3/uL (ref 4.0–10.5)

## 2015-07-16 LAB — URINE MICROSCOPIC-ADD ON

## 2015-07-16 LAB — URINALYSIS, ROUTINE W REFLEX MICROSCOPIC
BILIRUBIN URINE: NEGATIVE
Glucose, UA: NEGATIVE mg/dL
Hgb urine dipstick: NEGATIVE
Ketones, ur: NEGATIVE mg/dL
NITRITE: NEGATIVE
PH: 7.5 (ref 5.0–8.0)
Protein, ur: NEGATIVE mg/dL
SPECIFIC GRAVITY, URINE: 1.01 (ref 1.005–1.030)
UROBILINOGEN UA: 0.2 mg/dL (ref 0.0–1.0)

## 2015-07-16 LAB — ABO/RH: ABO/RH(D): O NEG

## 2015-07-16 MED ORDER — ONDANSETRON 4 MG PO TBDP
4.0000 mg | ORAL_TABLET | Freq: Once | ORAL | Status: AC
  Administered 2015-07-16: 4 mg via ORAL
  Filled 2015-07-16: qty 1

## 2015-07-16 MED ORDER — IBUPROFEN 600 MG PO TABS
600.0000 mg | ORAL_TABLET | Freq: Once | ORAL | Status: AC
  Administered 2015-07-16: 600 mg via ORAL
  Filled 2015-07-16: qty 1

## 2015-07-16 MED ORDER — RHO D IMMUNE GLOBULIN 1500 UNIT/2ML IJ SOSY
300.0000 ug | PREFILLED_SYRINGE | Freq: Once | INTRAMUSCULAR | Status: AC
  Administered 2015-07-16: 300 ug via INTRAMUSCULAR
  Filled 2015-07-16: qty 2

## 2015-07-16 MED ORDER — ONDANSETRON 4 MG PO TBDP: 4.0000 mg | ORAL_TABLET | Freq: Three times a day (TID) | ORAL | Status: DC | PRN

## 2015-07-16 NOTE — MAU Note (Signed)
Patient presents with spotting starting at 0400 this morning and cramping that has been ongoing throughout he pregnancy. Denies any recent intercourse.

## 2015-07-16 NOTE — Discharge Instructions (Signed)

## 2015-07-16 NOTE — MAU Provider Note (Signed)
History     CSN: [id number]  Arrival date and time: 07/16/15 1430   First Provider Initiated Contact with Patient 07/16/15 1521      Chief Complaint  Patient presents with  . Dysmenorrhea  . Vaginal Bleeding   HPI   [name] is a 24 y.o. female [id number] at [redacted]w[redacted]d twin gestation presenting with vaginal spotting and abdominal pain. The pain is located in her lower stomach and in her back. She currently rates her pain 10/10. The pain comes and goes and is similar to contraction/ cramping pain. Patient was here a few weeks ago with the same type of pain; she was given percocet and the patient would like a refill of this for pain.   Patient has had trouble with nausea and vomiting with this pregnancy; she tried taking phenergan however it made her heart beat very fast and the patient did not like the way it made her feel.   She drinks very minimal water and drinks a lot of soda.   OB History    Gravida Para Term Preterm AB TAB SAB Ectopic Multiple Living   0 Past Medical History  Diagnosis Date  . Depression   . GERD (gastroesophageal reflux disease)   . UTI (urinary tract infection) in pregnancy in first trimester   . Herpes     Past Surgical History  Procedure Laterality Date  . Dilation and curettage of uterus      Family History  Problem Relation Age of Onset  . Hypertension Maternal Grandmother   . Depression Maternal Grandmother   . Cancer Maternal Grandfather     lung    Social History  Substance Use Topics  . Smoking status: Current Every Day Smoker -- 0.25 packs/day for 6 years    Types: Cigarettes    Start date: 04/29/2007  . Smokeless tobacco: Never Used     Comment: Declines help  . Alcohol Use: No    Allergies: No Known Allergies  Prescriptions prior to admission  Medication Sig Dispense Refill Last Dose  . metoCLOPramide (REGLAN) 10 MG tablet Take every 6 hours for nausea/vomiting (Patient not taking: Reported on  07/16/2015) 30 tablet 0 Not Taking at Unknown time  . oxyCODONE-acetaminophen (PERCOCET/ROXICET) 5-325 MG per tablet Take 2 tablets by mouth every 4 (four) hours as needed for severe pain. (Patient not taking: Reported on 07/16/2015) 15 tablet 0 Not Taking at Unknown time  . Prenatal Vit-Fe Fumarate-FA (PRENATAL MULTIVITAMIN) TABS tablet Take 2 tablets by mouth daily at 12 noon.   Not Taking   Results for orders placed or performed during the hospital encounter of 07/16/15 (from the past 48 hour(s))  Urinalysis, Routine w reflex microscopic (not at [facility])     Status: Abnormal   Collection Time: 07/16/15  2:30 PM  Result Value Ref Range   Color, Urine YELLOW YELLOW   APPearance CLEAR CLEAR   Specific Gravity, Urine 1.010 1.005 - 1.030   pH 7.5 5.0 - 8.0   Glucose, UA NEGATIVE NEGATIVE mg/dL   Hgb urine dipstick NEGATIVE NEGATIVE   Bilirubin Urine NEGATIVE NEGATIVE   Ketones, ur NEGATIVE NEGATIVE mg/dL   Protein, ur NEGATIVE NEGATIVE mg/dL   Urobilinogen, UA 0.2 0.0 - 1.0 mg/dL   Nitrite NEGATIVE NEGATIVE   Leukocytes, UA SMALL (A) NEGATIVE  Urine microscopic-add on     Status: Abnormal   Collection Time: 07/16/15  2:30 PM  Result  Value Ref Range   Squamous Epithelial / LPF MANY (A) RARE   WBC, UA 0-2 <3 WBC/hpf   RBC / HPF 0-2 <3 RBC/hpf   Bacteria, UA RARE RARE  Rh IG workup (includes ABO/Rh)     Status: None (Preliminary result)   Collection Time: 07/16/15  3:58 PM  Result Value Ref Range   Gestational Age(Wks) 12    ABO/RH(D) O NEG    Antibody Screen NEG    Unit Number [id number]    Blood Component Type RHIG    Unit division 00    Status of Unit ISSUED    Transfusion Status OK TO TRANSFUSE   CBC     Status: Abnormal   Collection Time: 07/16/15  3:58 PM  Result Value Ref Range   WBC 8.9 4.0 - 10.5 K/uL   RBC 3.73 (L) 3.87 - 5.11 MIL/uL   Hemoglobin 10.8 (L) 12.0 - 15.0 g/dL   HCT 81.1 (L) 91.4 - 78.2 %   MCV 85.5 78.0 - 100.0 fL   MCH 29.0 26.0 - 34.0 pg   MCHC 33.9  30.0 - 36.0 g/dL   RDW 95.6 21.3 - 08.6 %   Platelets 281 150 - 400 K/uL  ABO/Rh     Status: None   Collection Time: 07/16/15  3:58 PM  Result Value Ref Range   ABO/RH(D) O NEG      Review of Systems  Constitutional: Negative for fever and chills.  Gastrointestinal: Positive for nausea, vomiting and abdominal pain (Bilateral lower abdominal pain that radiates to both sides of her lower back. ). Negative for diarrhea and constipation.  Musculoskeletal: Positive for back pain.   Physical Exam   Blood pressure 115/59, pulse 81, temperature 97.7 F (36.5 C), temperature source Oral, last menstrual period 03/30/2015.  Physical Exam  Constitutional: She is oriented to person, place, and time. She appears well-developed and well-nourished. No distress.  HENT:  Head: Normocephalic.  Eyes: Pupils are equal, round, and reactive to light.  Neck: Neck supple.  GI: Normal appearance. There is generalized tenderness. There is no rigidity, no rebound, no guarding and no CVA tenderness.  Genitourinary:  Speculum exam: Vagina - Small amount of creamy discharge, no odor Cervix - No contact bleeding, no active bleeding  Bimanual exam: Cervix closed, no CMT  Uterus non tender, Gravid  Adnexa non tender, no masses bilaterally Chaperone present for exam.  Musculoskeletal: Normal range of motion.  Neurological: She is alert and oriented to person, place, and time.  Skin: Skin is warm. She is not diaphoretic.  Psychiatric: Her behavior is normal.    MAU Course  Procedures  None  MDM  O negative blood type; patient received rhogam today in MAU.  + fetal heart tones via doppler.  Zofran 4 mg given PO; patient aware of the risks with usage in the first trimester. Patient unable to take phenergan. Ibuprofen 600 mg PO given Patient rates her pain 1/10 following ibuprofen.  Patient tolerated 2 sandwiches in MAU without vomiting.  Discussed patient with Dr. Adrian Blackwater.   Assessment and Plan    A:  1. Pregnancy related pelvic pain in first trimester, antepartum   2. Abdominal pain in pregnancy   3. Nausea and vomiting in pregnancy   4. Rh negative state in antepartum period, first trimester, fetus 2    P:  Discharge home in stable condition Return to MAU if symptoms worsen If pain continues, patient may need further imaging workup. Patient to keep her appointment in  the [facility] Ok to take ibuprofen for 2-3 doses only.  RX: Zofran Discussed the risk of narcotic use in pregnancy.  Increase PO water intake.  Small, frequent meals.   Duane LopeJennifer I Rasch, NP 07/16/2015 4:24 PM

## 2015-07-17 LAB — RH IG WORKUP (INCLUDES ABO/RH)
ABO/RH(D): O NEG
Antibody Screen: NEGATIVE
GESTATIONAL AGE(WKS): 12
UNIT DIVISION: 0

## 2015-07-23 ENCOUNTER — Encounter (HOSPITAL_COMMUNITY): Payer: Self-pay

## 2015-07-23 ENCOUNTER — Other Ambulatory Visit: Payer: Self-pay | Admitting: Obstetrics & Gynecology

## 2015-07-23 ENCOUNTER — Ambulatory Visit (HOSPITAL_COMMUNITY)
Admission: RE | Admit: 2015-07-23 | Discharge: 2015-07-23 | Disposition: A | Discharge status: Home / Self Care | Payer: Medicaid Other | Source: Ambulatory Visit | Attending: Family Medicine | Admitting: Family Medicine

## 2015-07-23 DIAGNOSIS — O30009 Twin pregnancy, unspecified number of placenta and unspecified number of amniotic sacs, unspecified trimester: Secondary | ICD-10-CM

## 2015-07-23 DIAGNOSIS — O99331 Smoking (tobacco) complicating pregnancy, first trimester: Secondary | ICD-10-CM | POA: Diagnosis not present

## 2015-07-23 DIAGNOSIS — O98519 Other viral diseases complicating pregnancy, unspecified trimester: Secondary | ICD-10-CM | POA: Diagnosis not present

## 2015-07-23 DIAGNOSIS — B009 Herpesviral infection, unspecified: Secondary | ICD-10-CM

## 2015-07-23 DIAGNOSIS — Z3A12 12 weeks gestation of pregnancy: Secondary | ICD-10-CM

## 2015-07-23 DIAGNOSIS — O30041 Twin pregnancy, dichorionic/diamniotic, first trimester: Secondary | ICD-10-CM | POA: Diagnosis not present

## 2015-07-24 ENCOUNTER — Encounter: Payer: Self-pay | Admitting: *Deleted

## 2015-07-26 ENCOUNTER — Encounter: Payer: Self-pay | Admitting: Family Medicine

## 2015-07-31 ENCOUNTER — Encounter: Payer: Medicaid Other | Admitting: Obstetrics and Gynecology

## 2015-08-14 ENCOUNTER — Encounter (HOSPITAL_COMMUNITY): Payer: Self-pay

## 2015-08-14 ENCOUNTER — Ambulatory Visit (HOSPITAL_COMMUNITY)
Admit: 2015-08-14 | Discharge: 2015-08-14 | Disposition: A | Discharge status: Home / Self Care | Payer: Medicaid Other | Attending: Advanced Practice Midwife | Admitting: Advanced Practice Midwife

## 2015-08-14 ENCOUNTER — Encounter (HOSPITAL_COMMUNITY): Payer: Self-pay | Admitting: *Deleted

## 2015-08-14 ENCOUNTER — Inpatient Hospital Stay (HOSPITAL_COMMUNITY): Payer: Medicaid Other

## 2015-08-14 ENCOUNTER — Inpatient Hospital Stay (HOSPITAL_COMMUNITY)
Admission: AD | Admit: 2015-08-14 | Discharge: 2015-08-15 | Disposition: A | Discharge status: Home / Self Care | Payer: Medicaid Other | Source: Ambulatory Visit | Attending: Obstetrics & Gynecology | Admitting: Obstetrics & Gynecology

## 2015-08-14 DIAGNOSIS — O99332 Smoking (tobacco) complicating pregnancy, second trimester: Secondary | ICD-10-CM | POA: Insufficient documentation

## 2015-08-14 DIAGNOSIS — R1031 Right lower quadrant pain: Secondary | ICD-10-CM

## 2015-08-14 DIAGNOSIS — K529 Noninfective gastroenteritis and colitis, unspecified: Secondary | ICD-10-CM

## 2015-08-14 DIAGNOSIS — K219 Gastro-esophageal reflux disease without esophagitis: Secondary | ICD-10-CM | POA: Insufficient documentation

## 2015-08-14 DIAGNOSIS — A09 Infectious gastroenteritis and colitis, unspecified: Secondary | ICD-10-CM | POA: Diagnosis not present

## 2015-08-14 DIAGNOSIS — O99342 Other mental disorders complicating pregnancy, second trimester: Secondary | ICD-10-CM | POA: Insufficient documentation

## 2015-08-14 DIAGNOSIS — F329 Major depressive disorder, single episode, unspecified: Secondary | ICD-10-CM | POA: Insufficient documentation

## 2015-08-14 DIAGNOSIS — O26892 Other specified pregnancy related conditions, second trimester: Secondary | ICD-10-CM | POA: Diagnosis present

## 2015-08-14 DIAGNOSIS — O4702 False labor before 37 completed weeks of gestation, second trimester: Secondary | ICD-10-CM | POA: Diagnosis not present

## 2015-08-14 DIAGNOSIS — R109 Unspecified abdominal pain: Secondary | ICD-10-CM | POA: Diagnosis present

## 2015-08-14 DIAGNOSIS — Z3A16 16 weeks gestation of pregnancy: Secondary | ICD-10-CM | POA: Diagnosis not present

## 2015-08-14 LAB — WET PREP, GENITAL
Sperm: NONE SEEN
Trich, Wet Prep: NONE SEEN
Yeast Wet Prep HPF POC: NONE SEEN

## 2015-08-14 LAB — COMPREHENSIVE METABOLIC PANEL
ALT: 19 U/L (ref 14–54)
AST: 26 U/L (ref 15–41)
Albumin: 3.2 g/dL — ABNORMAL LOW (ref 3.5–5.0)
Alkaline Phosphatase: 83 U/L (ref 38–126)
Anion gap: 7 (ref 5–15)
BUN: 7 mg/dL (ref 6–20)
CHLORIDE: 104 mmol/L (ref 101–111)
CO2: 24 mmol/L (ref 22–32)
CREATININE: 0.32 mg/dL — AB (ref 0.44–1.00)
Calcium: 9.4 mg/dL (ref 8.9–10.3)
GFR calc non Af Amer: >60 mL/min (ref >60)
Glucose, Bld: 75 mg/dL (ref 65–99)
POTASSIUM: 3.7 mmol/L (ref 3.5–5.1)
SODIUM: 135 mmol/L (ref 135–145)
Total Bilirubin: 0.4 mg/dL (ref 0.3–1.2)
Total Protein: 6.4 g/dL — ABNORMAL LOW (ref 6.5–8.1)

## 2015-08-14 LAB — URINE MICROSCOPIC-ADD ON: RBC / HPF: NONE SEEN RBC/hpf (ref 0–5)

## 2015-08-14 LAB — URINALYSIS, ROUTINE W REFLEX MICROSCOPIC
Bilirubin Urine: NEGATIVE
GLUCOSE, UA: NEGATIVE mg/dL
Hgb urine dipstick: NEGATIVE
Ketones, ur: NEGATIVE mg/dL
Nitrite: NEGATIVE
PH: 7 (ref 5.0–8.0)
Protein, ur: NEGATIVE mg/dL
Specific Gravity, Urine: 1.02 (ref 1.005–1.030)

## 2015-08-14 LAB — CBC
HCT: 29.6 % — ABNORMAL LOW (ref 36.0–46.0)
Hemoglobin: 10 g/dL — ABNORMAL LOW (ref 12.0–15.0)
MCH: 29.5 pg (ref 26.0–34.0)
MCHC: 33.8 g/dL (ref 30.0–36.0)
MCV: 87.3 fL (ref 78.0–100.0)
PLATELETS: 276 10*3/uL (ref 150–400)
RBC: 3.39 MIL/uL — AB (ref 3.87–5.11)
RDW: 14.6 % (ref 11.5–15.5)
WBC: 12.5 10*3/uL — AB (ref 4.0–10.5)

## 2015-08-14 MED ORDER — SODIUM CHLORIDE 0.9 % IV SOLN
8.0000 mg | Freq: Once | INTRAVENOUS | Status: AC
  Administered 2015-08-14: 8 mg via INTRAVENOUS
  Filled 2015-08-14: qty 4

## 2015-08-14 MED ORDER — ONDANSETRON HCL 4 MG/2ML IJ SOLN
8.0000 mg | Freq: Once | INTRAMUSCULAR | Status: DC
  Filled 2015-08-14: qty 4

## 2015-08-14 MED ORDER — HYDROXYZINE HCL 50 MG/ML IM SOLN
50.0000 mg | Freq: Once | INTRAMUSCULAR | Status: AC
  Administered 2015-08-14: 50 mg via INTRAMUSCULAR
  Filled 2015-08-14: qty 1

## 2015-08-14 MED ORDER — HYDROMORPHONE HCL 1 MG/ML IJ SOLN
1.0000 mg | INTRAMUSCULAR | Status: DC | PRN
  Administered 2015-08-14 – 2015-08-15 (×3): 1 mg via INTRAVENOUS
  Filled 2015-08-14 (×3): qty 1

## 2015-08-14 MED ORDER — LACTATED RINGERS IV BOLUS (SEPSIS)
1000.0000 mL | Freq: Once | INTRAVENOUS | Status: AC
  Administered 2015-08-14: 1000 mL via INTRAVENOUS

## 2015-08-14 MED ORDER — TRAMADOL HCL 50 MG PO TABS
100.0000 mg | ORAL_TABLET | Freq: Once | ORAL | Status: AC
  Administered 2015-08-14: 100 mg via ORAL
  Filled 2015-08-14: qty 2

## 2015-08-14 MED ORDER — LACTATED RINGERS IV SOLN
INTRAVENOUS | Status: DC
  Administered 2015-08-14: 21:00:00 via INTRAVENOUS

## 2015-08-14 NOTE — MAU Note (Signed)
Pt requesting something for pain and something to drink. Estanislado SpireE Lawrence NP notified. Awaiting MRI results

## 2015-08-14 NOTE — MAU Note (Signed)
Pt stated she started having cramping last night. Feels like ctxs. About q 7 min. Denis vag discharge or bleeding. Pain is in her aback as well

## 2015-08-14 NOTE — MAU Provider Note (Signed)
Chief Complaint: Abdominal Pain  First Provider Initiated Contact with Patient 08/14/15 1712      SUBJECTIVE HPI: April [name] is a 24 y.o. [id number] at 141w0d who presents to [facility] by EMS reporting right low abd cramping wrapping around to right side and low back since last night that has worsened significantly this after noon. Now feels like contractions every 7 minutes and pelvic pressure. Has had some constipation this pregnancy, but had normal BM this morning w/ no improvement of pain.    Taking Zofran for hyperemesis because Phenergan does not work and makes her feel bad. Received Zofran in ambulance. Adequate relief of nausea and vomiting, although some nausea still persists.  Live di/di twin IUP confirmed by US.   Location: right lower quadrant/groin wrapping around right low back Quality: contraction pain Severity: 10/10 on pain scale Duration: <24 hours Context: none Timing: constant w/ intermittent worsening  Modifying factors: none. No improvement w/ BM Associated signs and symptoms: Pos for worsening of N/V. Hasn't checked temp. Neg for chills, diarrhea, vaginal bleeding, vaginal discharge, LOF, dysuria, flank pain, hematuria, frequency. Has had urinary frequency throughout pregnancy.    Past Medical History  Diagnosis Date  . Depression   . GERD (gastroesophageal reflux disease)   . UTI (urinary tract infection) in pregnancy in first trimester   . Herpes    OB History  Gravida Para Term Preterm AB SAB TAB Ectopic Multiple Living  4 1 1  0 2 1 1   1     # Outcome Date GA Lbr Len/2nd Weight Sex Delivery Anes PTL Lv  4 Current           3 SAB 2012             Comments: no complications, not sure of gestation  2 Term 05/22/08 2721w0d  6 lb (2.722 kg) M Vag-Spont EPI    1 TAB 2006             Past Surgical History  Procedure Laterality Date  . Dilation and curettage of uterus     Social History   Social History  . Marital Status: Single    Spouse  Name: N/A  . Number of Children: N/A  . Years of Education: N/A   Occupational History  . Not on file.   Social History Main Topics  . Smoking status: Current Every Day Smoker -- 0.25 packs/day for 6 years    Types: Cigarettes    Start date: 04/29/2007  . Smokeless tobacco: Never Used     Comment: Declines help  . Alcohol Use: No  . Drug Use: No     Comment: 05/2015 trying to wean off cigaretttes during pregnancy  . Sexual Activity:    Partners: Male    Pharmacist, hospitalBirth Control/ Protection: None   Other Topics Concern  . Not on file   Social History Narrative   No current facility-administered medications on file prior to encounter.   Current Outpatient Prescriptions on File Prior to Encounter  Medication Sig Dispense Refill  . ondansetron (ZOFRAN ODT) 4 MG disintegrating tablet Take 1 tablet (4 mg total) by mouth every 8 (eight) hours as needed for nausea or vomiting. 20 tablet 0  . Prenatal Vit-Fe Fumarate-FA (PRENATAL MULTIVITAMIN) TABS tablet Take 2 tablets by mouth daily at 12 noon.     No Known Allergies  I have reviewed the past Medical Hx, Surgical Hx, Social Hx, Allergies and Medications.   Review of Systems  Constitutional: Negative for fever,  chills and appetite change.  Gastrointestinal: Positive for nausea, vomiting, abdominal pain and constipation. Negative for diarrhea, blood in stool and abdominal distention.  Genitourinary: Positive for frequency and pelvic pain. Negative for dysuria, urgency, hematuria, flank pain, vaginal bleeding, vaginal discharge, difficulty urinating and vaginal pain.  Musculoskeletal: Positive for back pain.  Neurological: Negative for dizziness.    OBJECTIVE Patient Vitals for the past 24 hrs:  BP Temp Temp src Pulse Resp  08/14/15 1655 112/64 mmHg 98 F (36.7 C) Oral 95 18   Constitutional: Well-developed, well-nourished female in moderate distress.  Cardiovascular: normal rate Respiratory: normal rate and effort.  GI: Abd soft,  moderate RLQ TTP, ? Rebound tenderness, positive involuntary guarding, gravid appropriate for gestational age. Pos BS x 4. MS: Extremities nontender, no edema, normal ROM. Low back NT.  Neurologic: Alert and oriented x 4.  GU: Neg CVAT.  SPECULUM EXAM: NEFG, moderate amount of thin, white, mildly malodorous white discharge, no blood noted, cervix clean  BIMANUAL: cervix closed, long; uterus 18-week size, no adnexal tenderness or masses.  No CMT.  LAB RESULTS Results for orders placed or performed during the hospital encounter of 08/14/15 (from the past 24 hour(s))  Urinalysis, Routine w reflex microscopic (not at [facility])     Status: Abnormal   Collection Time: 08/14/15  4:45 PM  Result Value Ref Range   Color, Urine YELLOW YELLOW   APPearance HAZY (A) CLEAR   Specific Gravity, Urine 1.020 1.005 - 1.030   pH 7.0 5.0 - 8.0   Glucose, UA NEGATIVE NEGATIVE mg/dL   Hgb urine dipstick NEGATIVE NEGATIVE   Bilirubin Urine NEGATIVE NEGATIVE   Ketones, ur NEGATIVE NEGATIVE mg/dL   Protein, ur NEGATIVE NEGATIVE mg/dL   Nitrite NEGATIVE NEGATIVE   Leukocytes, UA TRACE (A) NEGATIVE  Urine microscopic-add on     Status: Abnormal   Collection Time: 08/14/15  4:45 PM  Result Value Ref Range   Squamous Epithelial / LPF 6-30 (A) NONE SEEN   WBC, UA 0-5 0 - 5 WBC/hpf   RBC / HPF NONE SEEN 0 - 5 RBC/hpf   Bacteria, UA FEW (A) NONE SEEN   Urine-Other AMORPHOUS URATES/PHOSPHATES   Wet prep, genital     Status: Abnormal   Collection Time: 08/14/15  5:20 PM  Result Value Ref Range   Yeast Wet Prep HPF POC NONE SEEN NONE SEEN   Trich, Wet Prep NONE SEEN NONE SEEN   Clue Cells Wet Prep HPF POC PRESENT (A) NONE SEEN   WBC, Wet Prep HPF POC MODERATE (A) NONE SEEN   Sperm NONE SEEN   CBC     Status: Abnormal   Collection Time: 08/14/15  5:50 PM  Result Value Ref Range   WBC 12.5 (H) 4.0 - 10.5 K/uL   RBC 3.39 (L) 3.87 - 5.11 MIL/uL   Hemoglobin 10.0 (L) 12.0 - 15.0 g/dL   HCT 16.1 (L) 09.6 - 04.5 %    MCV 87.3 78.0 - 100.0 fL   MCH 29.5 26.0 - 34.0 pg   MCHC 33.8 30.0 - 36.0 g/dL   RDW 40.9 81.1 - 91.4 %   Platelets 276 150 - 400 K/uL  Comprehensive metabolic panel     Status: Abnormal   Collection Time: 08/14/15  5:50 PM  Result Value Ref Range   Sodium 135 135 - 145 mmol/L   Potassium 3.7 3.5 - 5.1 mmol/L   Chloride 104 101 - 111 mmol/L   CO2 24 22 - 32 mmol/L  Glucose, Bld 75 65 - 99 mg/dL   BUN 7 6 - 20 mg/dL   Creatinine, Ser 8.11 (L) 0.44 - 1.00 mg/dL   Calcium 9.4 8.9 - 91.4 mg/dL   Total Protein 6.4 (L) 6.5 - 8.1 g/dL   Albumin 3.2 (L) 3.5 - 5.0 g/dL   AST 26 15 - 41 U/L   ALT 19 14 - 54 U/L   Alkaline Phosphatase 83 38 - 126 U/L   Total Bilirubin 0.4 0.3 - 1.2 mg/dL   GFR calc non Af Amer >60 >60 mL/min   GFR calc Af Amer >60 >60 mL/min   Anion gap 7 5 - 15    IMAGING OB Limited: Cervical length 3.5 cm, normal adnexa. Live di/di twin pregnancy. Subjectively normal fluid.  MAU COURSE UA, CBC, CMP, LR bolus, wet prep, GC/chlamydia cultures, OB Limited, Ultram.  No relief of pain with Ultram. Nausea and vomiting returned. Ultrasound normal. Discussed history, exam, labs and ultrasound with Dr. Jolayne Panther. Need to order MRI to evaluate appendix. MRI, Zofran, Dilaudid, nothing by mouth. Last food early this morning. Last drink (milk) at 2:30 PM.  Care of patient turned over to air Lyman Bishop, NP at 8 PM. Patient awaiting MRI.  [place], CNM 08/14/2015  8:27 PM    2353-S/w Dr. Jolayne Panther about MRI results. Recommended calling surgery to discuss treatment.  36- C/w Dr. Sheliah Hatch (general surgery). Recommends outpatient treatment with antibiotics for colitis. Pt states pain improved with dilaudid. Discussed dx & treatment. Pt ok for discharge.    A: 1. Colitis presumed infectious   2. Preterm contractions, second trimester   3. RLQ abdominal pain    P: Discharge home Discussed reasons to return to [facility]U Rx azithromycin & bentyl Keep f/u appts with  clinic  Judeth Horn, NP 08/15/2015 12:37 AM

## 2015-08-15 DIAGNOSIS — O4702 False labor before 37 completed weeks of gestation, second trimester: Secondary | ICD-10-CM | POA: Diagnosis not present

## 2015-08-15 DIAGNOSIS — A09 Infectious gastroenteritis and colitis, unspecified: Secondary | ICD-10-CM | POA: Diagnosis not present

## 2015-08-15 LAB — GC/CHLAMYDIA PROBE AMP (~~LOC~~) NOT AT ARMC
CHLAMYDIA, DNA PROBE: NEGATIVE
Neisseria Gonorrhea: NEGATIVE

## 2015-08-15 MED ORDER — DICYCLOMINE HCL 20 MG PO TABS: 20.0000 mg | ORAL_TABLET | Freq: Four times a day (QID) | ORAL | Status: DC | PRN

## 2015-08-15 MED ORDER — AZITHROMYCIN 500 MG PO TABS: 500.0000 mg | ORAL_TABLET | Freq: Two times a day (BID) | ORAL | Status: DC

## 2015-08-15 NOTE — Discharge Instructions (Signed)
Abdominal Pain During Pregnancy Abdominal pain is common in pregnancy. Most of the time, it does not cause harm. There are many causes of abdominal pain. Some causes are more serious than others. Some of the causes of abdominal pain in pregnancy are easily diagnosed. Occasionally, the diagnosis takes time to understand. Other times, the cause is not determined. Abdominal pain can be a sign that something is very wrong with the pregnancy, or the pain may have nothing to do with the pregnancy at all. For this reason, always tell your health care provider if you have any abdominal discomfort. HOME CARE INSTRUCTIONS  Monitor your abdominal pain for any changes. The following actions may help to alleviate any discomfort you are experiencing:  Do not have sexual intercourse or put anything in your vagina until your symptoms go away completely.  Get plenty of rest until your pain improves.  Drink clear fluids if you feel nauseous. Avoid solid food as long as you are uncomfortable or nauseous.  Only take over-the-counter or prescription medicine as directed by your health care provider.  Keep all follow-up appointments with your health care provider. SEEK IMMEDIATE MEDICAL CARE IF:  You are bleeding, leaking fluid, or passing tissue from the vagina.  You have increasing pain or cramping.  You have persistent vomiting.  You have painful or bloody urination.  You have a fever.  You notice a decrease in your baby's movements.  You have extreme weakness or feel faint.  You have shortness of breath, with or without abdominal pain.  You develop a severe headache with abdominal pain.  You have abnormal vaginal discharge with abdominal pain.  You have persistent diarrhea.  You have abdominal pain that continues even after rest, or gets worse. MAKE SURE YOU:   Understand these instructions.  Will watch your condition.  Will get help right away if you are not doing well or get worse.     This information is not intended to replace advice given to you by your health care provider. Make sure you discuss any questions you have with your health care provider.   Document Released: 09/15/2005 Document Revised: 07/06/2013 Document Reviewed: 04/14/2013 Elsevier Interactive Patient Education 2016 Elsevier Inc[employer] Colitis Colitis is inflammation of the colon. Colitis may last a short time (acute) or it may last a long time (chronic). CAUSES This condition may be caused by:  Viruses.  Bacteria.  Reactions to medicine.  Certain autoimmune diseases, such as Crohn disease or ulcerative colitis. SYMPTOMS Symptoms of this condition include:  Diarrhea.  Passing bloody or tarry stool.  Pain.  Fever.  Vomiting.  Tiredness (fatigue).  Weight loss.  Bloating.  Sudden increase in abdominal pain.  Having fewer bowel movements than usual.  TREATMENT Treatment may include:  Resting the bowel. This involves not eating or drinking for a period of time.  Fluids that are given through an IV tube.  Medicine for pain and diarrhea.  Antibiotic medicines.  Cortisone medicines.  Surgery. HOME CARE INSTRUCTIONS Eating and Drinking  Follow instructions from your health care provider about eating or drinking restrictions.  Drink enough fluid to keep your urine clear or pale yellow.  Work with a dietitian to determine which foods cause your condition to flare up.  Avoid foods that cause flare-ups.  Eat a well-balanced diet. Medicines  Take over-the-counter and prescription medicines only as told by your health care provider.  If you were prescribed an antibiotic medicine, take it as told by your health care provider. Do  not stop taking the antibiotic even if you start to feel better. General Instructions  Keep all follow-up visits as told by your health care provider. This is important. SEEK MEDICAL CARE IF:  Your symptoms do not go away.  You develop new  symptoms. SEEK IMMEDIATE MEDICAL CARE IF:  You have a fever that does not go away with treatment.  You develop chills.  You have extreme weakness, fainting, or dehydration.  You have repeated vomiting.  You develop severe pain in your abdomen.  You pass bloody or tarry stool.   This information is not intended to replace advice given to you by your health care provider. Make sure you discuss any questions you have with your health care provider.   Document Released: 10/23/2004 Document Revised: 06/06/2015 Document Reviewed: 01/08/2015 Elsevier Interactive Patient Education [employer]2016 Else[employer] Inc[employer]

## 2015-08-16 ENCOUNTER — Telehealth: Payer: Self-pay | Admitting: Family

## 2015-08-16 ENCOUNTER — Encounter: Payer: Medicaid Other | Admitting: Family

## 2015-08-16 NOTE — Telephone Encounter (Signed)
Patient missed her appointment. Message left to give us a call back, and get rescheduled.

## 2015-09-03 ENCOUNTER — Ambulatory Visit (HOSPITAL_COMMUNITY)
Admission: RE | Admit: 2015-09-03 | Discharge: 2015-09-03 | Disposition: A | Discharge status: Home / Self Care | Payer: Medicaid Other | Source: Ambulatory Visit | Attending: Family Medicine | Admitting: Family Medicine

## 2015-09-03 ENCOUNTER — Encounter (HOSPITAL_COMMUNITY): Payer: Self-pay

## 2015-09-03 VITALS — BP 116/59 | HR 89 | Wt 125.8 lb

## 2015-09-03 DIAGNOSIS — O99332 Smoking (tobacco) complicating pregnancy, second trimester: Secondary | ICD-10-CM | POA: Insufficient documentation

## 2015-09-03 DIAGNOSIS — O99322 Drug use complicating pregnancy, second trimester: Secondary | ICD-10-CM | POA: Insufficient documentation

## 2015-09-03 DIAGNOSIS — Z36 Encounter for antenatal screening of mother: Secondary | ICD-10-CM | POA: Diagnosis present

## 2015-09-03 DIAGNOSIS — O30042 Twin pregnancy, dichorionic/diamniotic, second trimester: Secondary | ICD-10-CM | POA: Diagnosis not present

## 2015-09-03 DIAGNOSIS — O30049 Twin pregnancy, dichorionic/diamniotic, unspecified trimester: Secondary | ICD-10-CM

## 2015-09-03 DIAGNOSIS — Z3A18 18 weeks gestation of pregnancy: Secondary | ICD-10-CM | POA: Insufficient documentation

## 2015-09-03 DIAGNOSIS — O30009 Twin pregnancy, unspecified number of placenta and unspecified number of amniotic sacs, unspecified trimester: Secondary | ICD-10-CM

## 2015-09-06 ENCOUNTER — Other Ambulatory Visit (HOSPITAL_COMMUNITY): Payer: Self-pay

## 2015-09-13 ENCOUNTER — Encounter: Payer: Self-pay | Admitting: Obstetrics & Gynecology

## 2015-09-13 ENCOUNTER — Encounter: Payer: Medicaid Other | Admitting: Obstetrics & Gynecology

## 2015-10-02 ENCOUNTER — Inpatient Hospital Stay (HOSPITAL_COMMUNITY)
Admission: AD | Admit: 2015-10-02 | Discharge: 2015-10-02 | Disposition: A | Discharge status: Home / Self Care | Payer: Medicaid Other | Source: Ambulatory Visit | Attending: Obstetrics & Gynecology | Admitting: Obstetrics & Gynecology

## 2015-10-02 ENCOUNTER — Encounter (HOSPITAL_COMMUNITY): Payer: Self-pay

## 2015-10-02 DIAGNOSIS — Z3A23 23 weeks gestation of pregnancy: Secondary | ICD-10-CM | POA: Diagnosis not present

## 2015-10-02 DIAGNOSIS — M545 Low back pain, unspecified: Secondary | ICD-10-CM

## 2015-10-02 DIAGNOSIS — R102 Pelvic and perineal pain: Secondary | ICD-10-CM | POA: Diagnosis not present

## 2015-10-02 DIAGNOSIS — O26892 Other specified pregnancy related conditions, second trimester: Secondary | ICD-10-CM | POA: Diagnosis not present

## 2015-10-02 DIAGNOSIS — O2692 Pregnancy related conditions, unspecified, second trimester: Secondary | ICD-10-CM

## 2015-10-02 DIAGNOSIS — O26899 Other specified pregnancy related conditions, unspecified trimester: Secondary | ICD-10-CM

## 2015-10-02 LAB — URINALYSIS, ROUTINE W REFLEX MICROSCOPIC
BILIRUBIN URINE: NEGATIVE
GLUCOSE, UA: NEGATIVE mg/dL
HGB URINE DIPSTICK: NEGATIVE
KETONES UR: NEGATIVE mg/dL
Leukocytes, UA: NEGATIVE
Nitrite: NEGATIVE
PROTEIN: NEGATIVE mg/dL
Specific Gravity, Urine: 1.015 (ref 1.005–1.030)
pH: 6.5 (ref 5.0–8.0)

## 2015-10-02 LAB — RAPID URINE DRUG SCREEN, HOSP PERFORMED
Amphetamines: NOT DETECTED
Barbiturates: NOT DETECTED
Benzodiazepines: NOT DETECTED
Cocaine: NOT DETECTED
Opiates: NOT DETECTED
Tetrahydrocannabinol: POSITIVE — AB

## 2015-10-02 MED ORDER — CYCLOBENZAPRINE HCL 10 MG PO TABS: 10.0000 mg | ORAL_TABLET | Freq: Two times a day (BID) | ORAL | Status: DC | PRN

## 2015-10-02 MED ORDER — CYCLOBENZAPRINE HCL 10 MG PO TABS
10.0000 mg | ORAL_TABLET | Freq: Once | ORAL | Status: AC
  Administered 2015-10-02: 10 mg via ORAL
  Filled 2015-10-02: qty 1

## 2015-10-02 NOTE — Discharge Instructions (Signed)
Round Ligament Pain The round ligament is a cord of muscle and tissue that helps to support the uterus. It can become a source of pain during pregnancy if it becomes stretched or twisted as the baby grows. The pain usually begins in the second trimester of pregnancy, and it can come and go until the baby is delivered. It is not a serious problem, and it does not cause harm to the baby. Round ligament pain is usually a short, sharp, and pinching pain, but it can also be a dull, lingering, and aching pain. The pain is felt in the lower side of the abdomen or in the groin. It usually starts deep in the groin and moves up to the outside of the hip area. Pain can occur with:  A sudden change in position.  Rolling over in bed.  Coughing or sneezing.  Physical activity. HOME CARE INSTRUCTIONS Watch your condition for any changes. Take these steps to help with your pain:  When the pain starts, relax. Then try:  Sitting down.  Flexing your knees up to your abdomen.  Lying on your side with one pillow under your abdomen and another pillow between your legs.  Sitting in a warm bath for 15-20 minutes or until the pain goes away.  Take over-the-counter and prescription medicines only as told by your health care provider.  Move slowly when you sit and stand.  Avoid long walks if they cause pain.  Stop or lessen your physical activities if they cause pain. SEEK MEDICAL CARE IF:  Your pain does not go away with treatment.  You feel pain in your back that you did not have before.  Your medicine is not helping. SEEK IMMEDIATE MEDICAL CARE IF:  You develop a fever or chills.  You develop uterine contractions.  You develop vaginal bleeding.  You develop nausea or vomiting.  You develop diarrhea.  You have pain when you urinate.   This information is not intended to replace advice given to you by your health care provider. Make sure you discuss any questions you have with your health  care provider.   Document Released: 06/24/2008 Document Revised: 12/08/2011 Document Reviewed: 11/22/2014 Elsevier Interactive Patient Education 2016 Elsevier Inc. Back Pain in Pregnancy Back pain during pregnancy is common. It happens in about half of all pregnancies. It is important for you and your baby that you remain active during your pregnancy.If you feel that back pain is not allowing you to remain active or sleep well, it is time to see your caregiver. Back pain may be caused by several factors related to changes during your pregnancy.Fortunately, unless you had trouble with your back before your pregnancy, the pain is likely to get better after you deliver. Low back pain usually occurs between the fifth and seventh months of pregnancy. It can, however, happen in the first couple months. Factors that increase the risk of back problems include:   Previous back problems.  Injury to your back.  Having twins or multiple births.  A chronic cough.  Stress.  Job-related repetitive motions.  Muscle or spinal disease in the back.  Family history of back problems, ruptured (herniated) discs, or osteoporosis.  Depression, anxiety, and panic attacks. CAUSES   When you are pregnant, your body produces a hormone called relaxin. This hormonemakes the ligaments connecting the low back and pubic bones more flexible. This flexibility allows the baby to be delivered more easily. When your ligaments are loose, your muscles need to work harder to  support your back. Soreness in your back can come from tired muscles. Soreness can also come from back tissues that are irritated since they are receiving less support.  As the baby grows, it puts pressure on the nerves and blood vessels in your pelvis. This can cause back pain.  As the baby grows and gets heavier during pregnancy, the uterus pushes the stomach muscles forward and changes your center of gravity. This makes your back muscles work  harder to maintain good posture. SYMPTOMS  Lumbar pain during pregnancy Lumbar pain during pregnancy usually occurs at or above the waist in the center of the back. There may be pain and numbness that radiates into your leg or foot. This is similar to low back pain experienced by non-pregnant women. It usually increases with sitting for long periods of time, standing, or repetitive lifting. Tenderness may also be present in the muscles along your upper back. Posterior pelvic pain during pregnancy Pain in the back of the pelvis is more common than lumbar pain in pregnancy. It is a deep pain felt in your side at the waistline, or across the tailbone (sacrum), or in both places. You may have pain on one or both sides. This pain can also go into the buttocks and backs of the upper thighs. Pubic and groin pain may also be present. The pain does not quickly resolve with rest, and morning stiffness may also be present. Pelvic pain during pregnancy can be brought on by most activities. A high level of fitness before and during pregnancy may or may not prevent this problem. Labor pain is usually 1 to 2 minutes apart, lasts for about 1 minute, and involves a bearing down feeling or pressure in your pelvis. However, if you are at term with the pregnancy, constant low back pain can be the beginning of early labor, and you should be aware of this. DIAGNOSIS  X-rays of the back should not be done during the first 12 to 14 weeks of the pregnancy and only when absolutely necessary during the rest of the pregnancy. MRIs do not give off radiation and are safe during pregnancy. MRIs also should only be done when absolutely necessary. HOME CARE INSTRUCTIONS  Exercise as directed by your caregiver. Exercise is the most effective way to prevent or manage back pain. If you have a back problem, it is especially important to avoid sports that require sudden body movements. Swimming and walking are great activities.  Do not  stand in one place for long periods of time.  Do not wear high heels.  Sit in chairs with good posture. Use a pillow on your lower back if necessary. Make sure your head rests over your shoulders and is not hanging forward.  Try sleeping on your side, preferably the left side, with a pillow or two between your legs. If you are sore after a night's rest, your bedmay betoo soft.Try placing a board between your mattress and box spring.  Listen to your body when lifting.If you are experiencing pain, ask for help or try bending yourknees more so you can use your leg muscles rather than your back muscles. Squat down when picking up something from the floor. Do not bend over.  Eat a healthy diet. Try to gain weight within your caregiver's recommendations.  Use heat or cold packs 3 to 4 times a day for 15 minutes to help with the pain.  Only take over-the-counter or prescription medicines for pain, discomfort, or fever as directed by  your caregiver. °Sudden (acute) back pain °· Use bed rest for only the most extreme, acute episodes of back pain. Prolonged bed rest over 48 hours will aggravate your condition. °· Ice is very effective for acute conditions. °· Put ice in a plastic bag. °· Place a towel between your skin and the bag. °· Leave the ice on for 10 to 20 minutes every 2 hours, or as needed. °· Using heat packs for 30 minutes prior to activities is also helpful. °Continued back pain °See your caregiver if you have continued problems. Your caregiver can help or refer you for appropriate physical therapy. With conditioning, most back problems can be avoided. Sometimes, a more serious issue may be the cause of back pain. You should be seen right away if new problems seem to be developing. Your caregiver may recommend: °· A maternity girdle. °· An elastic sling. °· A back brace. °· A massage therapist or acupuncture. °SEEK MEDICAL CARE IF:  °· You are not able to do most of your daily activities, even  when taking the pain medicine you were given. °· You need a referral to a physical therapist or chiropractor. °· You want to try acupuncture. °SEEK IMMEDIATE MEDICAL CARE IF: °· You develop numbness, tingling, weakness, or problems with the use of your arms or legs. °· You develop severe back pain that is no longer relieved with medicines. °· You have a sudden change in bowel or bladder control. °· You have increasing pain in other areas of the body. °· You develop shortness of breath, dizziness, or fainting. °· You develop nausea, vomiting, or sweating. °· You have back pain which is similar to labor pains. °· You have back pain along with your water breaking or vaginal bleeding. °· You have back pain or numbness that travels down your leg. °· Your back pain developed after you fell. °· You develop pain on one side of your back. You may have a kidney stone. °· You see blood in your urine. You may have a bladder infection or kidney stone. °· You have back pain with blisters. You may have shingles. °Back pain is fairly common during pregnancy but should not be accepted as just part of the process. Back pain should always be treated as soon as possible. This will make your pregnancy as pleasant as possible. °  °This information is not intended to replace advice given to you by your health care provider. Make sure you discuss any questions you have with your health care provider. °  °Document Released: 12/24/2005 Document Revised: 12/08/2011 Document Reviewed: 02/04/2011 °Elsevier Interactive Patient Education ©2016 Elsevier Inc. ° °

## 2015-10-02 NOTE — MAU Note (Signed)
Pt c/o back pain for about 1 month and lower abdominal cramping that started today. Some nausea-which is constant. Ran out of zofran. Has missed last 3 prenatal appointment, but has appointment tomorrow in clinic. Denies vaginal bleeding or discharge. Has some pain with urination. Has felt both babies move.

## 2015-10-02 NOTE — MAU Provider Note (Signed)
History    CSN: [id number]  Arrival date & time 10/02/15  1956   None     Chief Complaint  Patient presents with  . Abdominal Pain  . Back Pain    HPI  G4P1021 at 23 wks with twins in with c/o low back pain and constant low abd pain. Pt requesting dilaudid for pain but declines flexiril.    Past Medical History  Diagnosis Date  . Depression   . GERD (gastroesophageal reflux disease)   . UTI (urinary tract infection) in pregnancy in first trimester   . Herpes     Past Surgical History  Procedure Laterality Date  . Dilation and curettage of uterus      Family History  Problem Relation Age of Onset  . Hypertension Maternal Grandmother   . Depression Maternal Grandmother   . Cancer Maternal Grandfather     lung    Social History  Substance Use Topics  . Smoking status: Current Every Day Smoker -- 0.25 packs/day for 6 years    Types: Cigarettes    Start date: 04/29/2007  . Smokeless tobacco: Never Used     Comment: Declines help  . Alcohol Use: No    OB History    Gravida Para Term Preterm AB TAB SAB Ectopic Multiple Living   4 1 1  0 2 1 1   1       Review of Systems  Constitutional: Negative.   HENT: Negative.   Eyes: Negative.   Respiratory: Negative.   Cardiovascular: Negative.   Gastrointestinal: Positive for abdominal pain.  Endocrine: Negative.   Genitourinary: Negative.   Musculoskeletal: Positive for back pain.  Skin: Negative.   Allergic/Immunologic: Negative.   Neurological: Negative.   Hematological: Negative.   Psychiatric/Behavioral: Negative.     Allergies  Review of patient's allergies indicates no known allergies.  Home Medications   Current Outpatient Rx  Name  Route  Sig  Dispense  Refill  . cyclobenzaprine (FLEXERIL) 10 MG tablet   Oral   Take 1 tablet (10 mg total) by mouth 2 (two) times daily as needed for muscle spasms.   20 tablet   0     BP 122/59 mmHg  Pulse 101  Temp(Src) 97.9 F (36.6 C) (Oral)  Resp 18   Ht 5\' 1"  (1.549 m)  Wt 136 lb 3.2 oz (61.78 kg)  BMI 25.75 kg/m2  SpO2 100%  LMP 03/30/2015  Physical Exam  MAU Course  Procedures (including critical care time)  Results for orders placed or performed during the hospital encounter of 10/02/15 (from the past 24 hour(s))  Urinalysis, Routine w reflex microscopic (not at [facility])     Status: None   Collection Time: 10/02/15  8:23 PM  Result Value Ref Range   Color, Urine YELLOW YELLOW   APPearance CLEAR CLEAR   Specific Gravity, Urine 1.015 1.005 - 1.030   pH 6.5 5.0 - 8.0   Glucose, UA NEGATIVE NEGATIVE mg/dL   Hgb urine dipstick NEGATIVE NEGATIVE   Bilirubin Urine NEGATIVE NEGATIVE   Ketones, ur NEGATIVE NEGATIVE mg/dL   Protein, ur NEGATIVE NEGATIVE mg/dL   Nitrite NEGATIVE NEGATIVE   Leukocytes, UA NEGATIVE NEGATIVE     Labs Reviewed  URINALYSIS, ROUTINE W REFLEX MICROSCOPIC (NOT AT [facility])  URINE RAPID DRUG SCREEN, HOSP PERFORMED   No results found.   No diagnosis found.    MDM  Back pain in pregnancy abd pain in preg SVE cl/th/post/high Will d/c home if no uc's  Care assumed by me Judeth Horn, NP ) by Zerita Boers CNM at 2100.   A: 1. Low back pain during pregnancy in second trimester   2. Pain of round ligament affecting pregnancy, antepartum      P: Discharge home Rx flexeril Keep scheduled prenatal appt this Thursday (has no showed last 4 prenatal appts) Round ligament pain info given  Judeth Horn, NP  10/02/2015 9:31 PM

## 2015-10-02 NOTE — Progress Notes (Signed)
FHR dopplered. Difficult to trace due to gestational age. CNM aware and ok with doppler only

## 2015-10-02 NOTE — MAU Note (Signed)
Pt called out requesting to leave. Pt is requesting dilaudid and oxycodone and upset that they are not being given to her. States she is going to leave and go elsewhere to get treated for her pain. Pt signed discharge but refused discharge vital signs.

## 2015-10-03 ENCOUNTER — Ambulatory Visit (HOSPITAL_COMMUNITY): Payer: Medicaid Other

## 2015-10-04 ENCOUNTER — Ambulatory Visit (INDEPENDENT_AMBULATORY_CARE_PROVIDER_SITE_OTHER): Payer: Medicaid Other | Admitting: Family

## 2015-10-04 VITALS — BP 121/73 | HR 97 | Temp 98.0°F | Wt 137.5 lb

## 2015-10-04 DIAGNOSIS — O30003 Twin pregnancy, unspecified number of placenta and unspecified number of amniotic sacs, third trimester: Secondary | ICD-10-CM | POA: Diagnosis not present

## 2015-10-04 DIAGNOSIS — O30009 Twin pregnancy, unspecified number of placenta and unspecified number of amniotic sacs, unspecified trimester: Secondary | ICD-10-CM

## 2015-10-04 LAB — POCT URINALYSIS DIP (DEVICE)
Bilirubin Urine: NEGATIVE
Glucose, UA: NEGATIVE mg/dL
Hgb urine dipstick: NEGATIVE
Ketones, ur: NEGATIVE mg/dL
Nitrite: NEGATIVE
Protein, ur: NEGATIVE mg/dL
Specific Gravity, Urine: 1.02 (ref 1.005–1.030)
Urobilinogen, UA: 0.2 mg/dL (ref 0.0–1.0)
pH: 7 (ref 5.0–8.0)

## 2015-10-04 MED ORDER — ACYCLOVIR 200 MG PO CAPS: ORAL_CAPSULE | ORAL | Status: DC

## 2015-10-04 MED ORDER — ACETAMINOPHEN-CODEINE 300-30 MG PO TABS: 1.0000 | ORAL_TABLET | Freq: Four times a day (QID) | ORAL | Status: DC | PRN

## 2015-10-04 NOTE — Progress Notes (Signed)
Subjective:  April [name] is a 25 y.o. G4P1021 at 2250w2d being seen today for ongoing prenatal care.  She is currently monitored for the following issues for this high-risk pregnancy and has TOBACCO ABUSE; ; RH FACTOR, NEGATIVE; UTI'S, HX OF;  and Twin pregnancy, antepartum condition or complication on her problem list.  Patient reports backache and vaginal itching/burning.  Pt also has noted a vaginal lesion with burning.   Pt has been unable to attend meetings due to transportation issues.  Grandmother had a broken toe and was unable to drive.    Contractions: Irritability. Vag. Bleeding: None.  Movement: Present. Denies leaking of fluid.   The following portions of the patient's history were reviewed and updated as appropriate: allergies, current medications, past family history, past medical history, past social history, past surgical history and problem list. Problem list updated.  Objective:   Filed Vitals:   10/04/15 1036  BP: 121/73  Pulse: 97  Temp: 98 F (36.7 C)  Weight: 137 lb 8 oz (62.37 kg)    Fetal Status: Fetal Heart Rate (bpm): 146/150 Fundal Height: 30 cm Movement: Present     General:  Alert, oriented and cooperative. Patient is in no acute distress.  Skin: Skin is warm and dry. No rash noted.   Cardiovascular: Normal heart rate noted  Respiratory: Normal respiratory effort, no problems with respiration noted  Abdomen: Soft, gravid, appropriate for gestational age. Pain/Pressure: Present     Pelvic: Vag. Bleeding: None    Vagina red, +white discharge, +vaginal lesion on upper labia Cervical exam deferred        Extremities: Normal range of motion.  Edema: None  Mental Status: Normal mood and affect. Normal behavior. Normal judgment and thought content.   Urinalysis: Urine Protein: Negative Urine Glucose: Negative  Assessment and Plan:  Pregnancy: G4P1021 at 4950w2d  1. Twin pregnancy, antepartum condition or complication - Growth ultrasound scheduled for next  week.    2.  Vaginal irritation/lesion - Wet prep collected - RX Acyclovir  3.  Back Pain - Discussed exercises - Using ice - RX Tylenol #3 (10)  Preterm labor symptoms and general obstetric precautions including but not limited to vaginal bleeding, contractions, leaking of fluid and fetal movement were reviewed in detail with the patient. Please refer to After Visit Summary for other counseling recommendations.  Return in about 2 weeks (around 10/18/2015).   Eino FarberWalidah Kennith GainN Karim, CNM

## 2015-10-04 NOTE — Progress Notes (Signed)
Pt is having a herpes outbreak.  Having severe back pain flexeril not helping.  Needs refill on zofran.

## 2015-10-05 LAB — WET PREP, GENITAL
Trich, Wet Prep: NONE SEEN
Yeast Wet Prep HPF POC: NONE SEEN

## 2015-10-06 ENCOUNTER — Telehealth: Payer: Self-pay | Admitting: Family

## 2015-10-06 ENCOUNTER — Other Ambulatory Visit: Payer: Self-pay | Admitting: Family

## 2015-10-06 MED ORDER — METRONIDAZOLE 500 MG PO TABS: 500.0000 mg | ORAL_TABLET | Freq: Two times a day (BID) | ORAL | Status: DC

## 2015-10-06 NOTE — Telephone Encounter (Signed)
Called to leave message regarding +clue and need for RX.  RX sent to pharmacy.  Phone number no longer available.

## 2015-10-10 ENCOUNTER — Ambulatory Visit (INDEPENDENT_AMBULATORY_CARE_PROVIDER_SITE_OTHER): Payer: Medicaid Other | Admitting: Obstetrics & Gynecology

## 2015-10-10 ENCOUNTER — Encounter (HOSPITAL_COMMUNITY): Payer: Self-pay

## 2015-10-10 ENCOUNTER — Other Ambulatory Visit (HOSPITAL_COMMUNITY): Payer: Self-pay | Admitting: Maternal and Fetal Medicine

## 2015-10-10 ENCOUNTER — Encounter: Payer: Medicaid Other | Admitting: Obstetrics & Gynecology

## 2015-10-10 ENCOUNTER — Ambulatory Visit (HOSPITAL_COMMUNITY)
Admission: RE | Admit: 2015-10-10 | Discharge: 2015-10-10 | Disposition: A | Discharge status: Home / Self Care | Payer: Medicaid Other | Source: Ambulatory Visit | Attending: Maternal and Fetal Medicine | Admitting: Maternal and Fetal Medicine

## 2015-10-10 VITALS — Wt 138.1 lb

## 2015-10-10 DIAGNOSIS — O35EXX0 Maternal care for other (suspected) fetal abnormality and damage, fetal genitourinary anomalies, not applicable or unspecified: Secondary | ICD-10-CM

## 2015-10-10 DIAGNOSIS — O99891 Other specified diseases and conditions complicating pregnancy: Secondary | ICD-10-CM

## 2015-10-10 DIAGNOSIS — Z3A24 24 weeks gestation of pregnancy: Secondary | ICD-10-CM | POA: Insufficient documentation

## 2015-10-10 DIAGNOSIS — O9989 Other specified diseases and conditions complicating pregnancy, childbirth and the puerperium: Secondary | ICD-10-CM

## 2015-10-10 DIAGNOSIS — O26892 Other specified pregnancy related conditions, second trimester: Secondary | ICD-10-CM

## 2015-10-10 DIAGNOSIS — O358XX0 Maternal care for other (suspected) fetal abnormality and damage, not applicable or unspecified: Secondary | ICD-10-CM

## 2015-10-10 DIAGNOSIS — O30049 Twin pregnancy, dichorionic/diamniotic, unspecified trimester: Secondary | ICD-10-CM

## 2015-10-10 DIAGNOSIS — M549 Dorsalgia, unspecified: Secondary | ICD-10-CM | POA: Insufficient documentation

## 2015-10-10 DIAGNOSIS — O99322 Drug use complicating pregnancy, second trimester: Secondary | ICD-10-CM | POA: Diagnosis not present

## 2015-10-10 DIAGNOSIS — Z36 Encounter for antenatal screening of mother: Secondary | ICD-10-CM | POA: Diagnosis not present

## 2015-10-10 DIAGNOSIS — O30009 Twin pregnancy, unspecified number of placenta and unspecified number of amniotic sacs, unspecified trimester: Secondary | ICD-10-CM

## 2015-10-10 DIAGNOSIS — O283 Abnormal ultrasonic finding on antenatal screening of mother: Secondary | ICD-10-CM | POA: Diagnosis not present

## 2015-10-10 DIAGNOSIS — O30003 Twin pregnancy, unspecified number of placenta and unspecified number of amniotic sacs, third trimester: Secondary | ICD-10-CM

## 2015-10-10 DIAGNOSIS — O30042 Twin pregnancy, dichorionic/diamniotic, second trimester: Secondary | ICD-10-CM | POA: Insufficient documentation

## 2015-10-10 NOTE — Progress Notes (Signed)
Pt c/o cramping and continued back pain.  Tylenol #3 not effective would like something else.  Also reports having diarrhea x 1 and vomiting.   [facility] @ [phone], was advised to send referral via [facility] then their office will call pt with an appt.  Pt notified and given contact info.

## 2015-10-10 NOTE — Progress Notes (Signed)
Subjective:  April [name] is a 25 y.o. G4P1021 at 7150w1d being seen today for ongoing prenatal care.  She is currently monitored for the following issues for this high-risk pregnancy and has TOBACCO ABUSE; ABNORMAL VAGINAL BLEEDING; RH FACTOR, NEGATIVE; UTI'S, HX OF; Depressive disorder; Sleep difficulties; Menorrhagia; Twin pregnancy, antepartum condition or complication; and Back pain affecting pregnancy in second trimester on her problem list.  Patient reports backache. This has been present for about a month. It is not from contractions. She was given a muscle relaxer and Tylenol #3 and she is requesting something stronger.  Contractions: Irritability. Vag. Bleeding: None.  Movement: Present. Denies leaking of fluid.   The following portions of the patient's history were reviewed and updated as appropriate: allergies, current medications, past family history, past medical history, past social history, past surgical history and problem list. Problem list updated.  Objective:   Filed Vitals:   10/10/15 1528  Weight: 138 lb 1.6 oz (62.642 kg)    Fetal Status: Fetal Heart Rate (bpm): 141/155   Movement: Present     General:  Alert, oriented and cooperative. Patient is in no acute distress.  Skin: Skin is warm and dry. No rash noted.   Cardiovascular: Normal heart rate noted  Respiratory: Normal respiratory effort, no problems with respiration noted  Abdomen: Soft, gravid, appropriate for gestational age. Pain/Pressure: Present     Pelvic: Vag. Bleeding: None     Cervical exam deferred        Extremities: Normal range of motion.  Edema: None  Mental Status: Normal mood and affect. Normal behavior. Normal judgment and thought content.  She has normal DTRs in both legs and normal strength in her lower extremeties. Urinalysis:      Assessment and Plan:  Pregnancy: G4P1021 at 4150w1d  1. Twin pregnancy, antepartum condition or complication   2. Back pain affecting pregnancy in second  trimester - I have declined to write stronger narcotics. I have recommended that she have her back pain evaluated by a orthopedist.  Preterm labor symptoms and general obstetric precautions including but not limited to vaginal bleeding, contractions, leaking of fluid and fetal movement were reviewed in detail with the patient. Please refer to After Visit Summary for other counseling recommendations.  Return for keep scheduled appt.   Allie BossierMyra C Naliyah Neth, MD

## 2015-10-15 NOTE — Progress Notes (Signed)
   Subjective:    Patient ID: April [name], female    DOB: Jan 29, 1991, 25 y.o.   MRN: [id number]  HPI Please see other encounter on same day   Review of Systems     Objective:   Physical Exam        Assessment & Plan:

## 2015-10-18 ENCOUNTER — Encounter: Payer: Medicaid Other | Admitting: Physician Assistant

## 2015-10-31 ENCOUNTER — Ambulatory Visit (HOSPITAL_COMMUNITY): Payer: Medicaid Other

## 2015-11-06 ENCOUNTER — Inpatient Hospital Stay (HOSPITAL_COMMUNITY)
Admission: AD | Admit: 2015-11-06 | Discharge: 2015-11-06 | Disposition: A | Discharge status: Home / Self Care | Payer: Medicaid Other | Source: Ambulatory Visit | Attending: Family Medicine | Admitting: Family Medicine

## 2015-11-06 ENCOUNTER — Encounter (HOSPITAL_COMMUNITY): Payer: Self-pay | Admitting: *Deleted

## 2015-11-06 DIAGNOSIS — O26892 Other specified pregnancy related conditions, second trimester: Secondary | ICD-10-CM

## 2015-11-06 DIAGNOSIS — B009 Herpesviral infection, unspecified: Secondary | ICD-10-CM | POA: Insufficient documentation

## 2015-11-06 DIAGNOSIS — F329 Major depressive disorder, single episode, unspecified: Secondary | ICD-10-CM | POA: Diagnosis not present

## 2015-11-06 DIAGNOSIS — K219 Gastro-esophageal reflux disease without esophagitis: Secondary | ICD-10-CM | POA: Insufficient documentation

## 2015-11-06 DIAGNOSIS — Z87440 Personal history of urinary (tract) infections: Secondary | ICD-10-CM | POA: Diagnosis not present

## 2015-11-06 DIAGNOSIS — A084 Viral intestinal infection, unspecified: Secondary | ICD-10-CM

## 2015-11-06 DIAGNOSIS — O30003 Twin pregnancy, unspecified number of placenta and unspecified number of amniotic sacs, third trimester: Secondary | ICD-10-CM

## 2015-11-06 DIAGNOSIS — H9202 Otalgia, left ear: Secondary | ICD-10-CM | POA: Insufficient documentation

## 2015-11-06 DIAGNOSIS — R112 Nausea with vomiting, unspecified: Secondary | ICD-10-CM | POA: Insufficient documentation

## 2015-11-06 DIAGNOSIS — R51 Headache: Secondary | ICD-10-CM | POA: Diagnosis not present

## 2015-11-06 DIAGNOSIS — R197 Diarrhea, unspecified: Secondary | ICD-10-CM | POA: Insufficient documentation

## 2015-11-06 DIAGNOSIS — R1030 Lower abdominal pain, unspecified: Secondary | ICD-10-CM | POA: Insufficient documentation

## 2015-11-06 DIAGNOSIS — M549 Dorsalgia, unspecified: Secondary | ICD-10-CM

## 2015-11-06 DIAGNOSIS — O9989 Other specified diseases and conditions complicating pregnancy, childbirth and the puerperium: Secondary | ICD-10-CM

## 2015-11-06 DIAGNOSIS — F17210 Nicotine dependence, cigarettes, uncomplicated: Secondary | ICD-10-CM | POA: Insufficient documentation

## 2015-11-06 DIAGNOSIS — O30009 Twin pregnancy, unspecified number of placenta and unspecified number of amniotic sacs, unspecified trimester: Secondary | ICD-10-CM

## 2015-11-06 HISTORY — DX: Unspecified infectious disease: B99.9

## 2015-11-06 LAB — URINALYSIS, ROUTINE W REFLEX MICROSCOPIC
Glucose, UA: NEGATIVE mg/dL
Hgb urine dipstick: NEGATIVE
Ketones, ur: >80 mg/dL — AB
Nitrite: NEGATIVE
Protein, ur: 30 mg/dL — AB
Specific Gravity, Urine: >1.03 — ABNORMAL HIGH (ref 1.005–1.030)
pH: 6 (ref 5.0–8.0)

## 2015-11-06 LAB — COMPREHENSIVE METABOLIC PANEL
ALBUMIN: 2.2 g/dL — AB (ref 3.5–5.0)
ALK PHOS: 125 U/L (ref 38–126)
ALT: 10 U/L — ABNORMAL LOW (ref 14–54)
ANION GAP: 9 (ref 5–15)
AST: 25 U/L (ref 15–41)
BUN: <5 mg/dL — ABNORMAL LOW (ref 6–20)
CALCIUM: 8.5 mg/dL — AB (ref 8.9–10.3)
CHLORIDE: 108 mmol/L (ref 101–111)
CO2: 19 mmol/L — AB (ref 22–32)
Creatinine, Ser: 0.38 mg/dL — ABNORMAL LOW (ref 0.44–1.00)
GFR calc Af Amer: >60 mL/min (ref >60)
GFR calc non Af Amer: >60 mL/min (ref >60)
GLUCOSE: 88 mg/dL (ref 65–99)
POTASSIUM: 3.6 mmol/L (ref 3.5–5.1)
SODIUM: 136 mmol/L (ref 135–145)
Total Bilirubin: 0.7 mg/dL (ref 0.3–1.2)
Total Protein: 5.6 g/dL — ABNORMAL LOW (ref 6.5–8.1)

## 2015-11-06 LAB — CBC
HCT: 24.4 % — ABNORMAL LOW (ref 36.0–46.0)
HEMOGLOBIN: 7.7 g/dL — AB (ref 12.0–15.0)
MCH: 26.2 pg (ref 26.0–34.0)
MCHC: 31.6 g/dL (ref 30.0–36.0)
MCV: 83 fL (ref 78.0–100.0)
Platelets: 232 10*3/uL (ref 150–400)
RBC: 2.94 MIL/uL — AB (ref 3.87–5.11)
RDW: 13.8 % (ref 11.5–15.5)
WBC: 15.7 10*3/uL — ABNORMAL HIGH (ref 4.0–10.5)

## 2015-11-06 LAB — URINE MICROSCOPIC-ADD ON

## 2015-11-06 MED ORDER — LACTATED RINGERS IV BOLUS (SEPSIS)
1000.0000 mL | Freq: Once | INTRAVENOUS | Status: AC
  Administered 2015-11-06: 1000 mL via INTRAVENOUS

## 2015-11-06 MED ORDER — OXYCODONE-ACETAMINOPHEN 5-325 MG PO TABS: 1.0000 | ORAL_TABLET | Freq: Four times a day (QID) | ORAL | Status: DC | PRN

## 2015-11-06 MED ORDER — LACTATED RINGERS IV BOLUS (SEPSIS)
2000.0000 mL | Freq: Once | INTRAVENOUS | Status: AC
  Administered 2015-11-06: 1000 mL via INTRAVENOUS

## 2015-11-06 MED ORDER — ACETAMINOPHEN 325 MG PO TABS
650.0000 mg | ORAL_TABLET | Freq: Four times a day (QID) | ORAL | Status: DC | PRN
  Administered 2015-11-06: 650 mg via ORAL
  Filled 2015-11-06: qty 2

## 2015-11-06 MED ORDER — TRAMADOL HCL 50 MG PO TABS
50.0000 mg | ORAL_TABLET | Freq: Once | ORAL | Status: AC
Start: 2015-11-06 — End: 2015-11-06
  Administered 2015-11-06: 50 mg via ORAL
  Filled 2015-11-06: qty 1

## 2015-11-06 MED ORDER — PROMETHAZINE HCL 25 MG/ML IJ SOLN
25.0000 mg | Freq: Once | INTRAMUSCULAR | Status: AC
  Administered 2015-11-06: 25 mg via INTRAVENOUS
  Filled 2015-11-06: qty 1

## 2015-11-06 MED ORDER — ONDANSETRON HCL 4 MG PO TABS: 4.0000 mg | ORAL_TABLET | Freq: Three times a day (TID) | ORAL | Status: DC | PRN

## 2015-11-06 MED ORDER — MORPHINE SULFATE (PF) 4 MG/ML IV SOLN
1.0000 mg | Freq: Once | INTRAVENOUS | Status: AC
  Administered 2015-11-06: 1 mg via INTRAVENOUS
  Filled 2015-11-06: qty 1

## 2015-11-06 MED ORDER — ZOLPIDEM TARTRATE 5 MG PO TABS: 5.0000 mg | ORAL_TABLET | Freq: Every evening | ORAL | Status: DC | PRN

## 2015-11-06 MED ORDER — ONDANSETRON HCL 4 MG/2ML IJ SOLN
4.0000 mg | Freq: Four times a day (QID) | INTRAMUSCULAR | Status: DC | PRN
Start: 2015-11-06 — End: 2015-11-06
  Administered 2015-11-06 (×2): 4 mg via INTRAVENOUS
  Filled 2015-11-06 (×3): qty 2

## 2015-11-06 NOTE — MAU Note (Signed)
Son had vomiting and diarrhea yesterday

## 2015-11-06 NOTE — Discharge Instructions (Signed)
Your hemoglobin (blood iron level) was slightly low. We would like you to talk to your OBGYN about this. Since you are having an upset stomach, we will not prescribe iron for you right now.   Viral Gastroenteritis Viral gastroenteritis is also known as stomach flu. This condition affects the stomach and intestinal tract. It can cause sudden diarrhea and vomiting. The illness typically lasts 3 to 8 days. Most people develop an immune response that eventually gets rid of the virus. While this natural response develops, the virus can make you quite ill. CAUSES  Many different viruses can cause gastroenteritis, such as rotavirus or noroviruses. You can catch one of these viruses by consuming contaminated food or water. You may also catch a virus by sharing utensils or other personal items with an infected person or by touching a contaminated surface. SYMPTOMS  The most common symptoms are diarrhea and vomiting. These problems can cause a severe loss of body fluids (dehydration) and a body salt (electrolyte) imbalance. Other symptoms may include:  Fever.  Headache.  Fatigue.  Abdominal pain. DIAGNOSIS  Your caregiver can usually diagnose viral gastroenteritis based on your symptoms and a physical exam. A stool sample may also be taken to test for the presence of viruses or other infections. TREATMENT  This illness typically goes away on its own. Treatments are aimed at rehydration. The most serious cases of viral gastroenteritis involve vomiting so severely that you are not able to keep fluids down. In these cases, fluids must be given through an intravenous line (IV). HOME CARE INSTRUCTIONS   Drink enough fluids to keep your urine clear or pale yellow. Drink small amounts of fluids frequently and increase the amounts as tolerated.  Ask your caregiver for specific rehydration instructions.  Avoid:  Foods high in sugar.  Alcohol.  Carbonated drinks.  Tobacco.  Juice.  Caffeine  drinks.  Extremely hot or cold fluids.  Fatty, greasy foods.  Too much intake of anything at one time.  Dairy products until 24 to 48 hours after diarrhea stops.  You may consume probiotics. Probiotics are active cultures of beneficial bacteria. They may lessen the amount and number of diarrheal stools in adults. Probiotics can be found in yogurt with active cultures and in supplements.  Wash your hands well to avoid spreading the virus.  Only take over-the-counter or prescription medicines for pain, discomfort, or fever as directed by your caregiver. Do not give aspirin to children. Antidiarrheal medicines are not recommended.  Ask your caregiver if you should continue to take your regular prescribed and over-the-counter medicines.  Keep all follow-up appointments as directed by your caregiver. SEEK IMMEDIATE MEDICAL CARE IF:   You are unable to keep fluids down.  You do not urinate at least once every 6 to 8 hours.  You develop shortness of breath.  You notice blood in your stool or vomit. This may look like coffee grounds.  You have abdominal pain that increases or is concentrated in one small area (localized).  You have persistent vomiting or diarrhea.  You have a fever.  The patient is a child younger than 3 months, and he or she has a fever.  The patient is a child older than 3 months, and he or she has a fever and persistent symptoms.  The patient is a child older than 3 months, and he or she has a fever and symptoms suddenly get worse.  The patient is a baby, and he or she has no tears when crying.  MAKE SURE YOU:   Understand these instructions.  Will watch your condition.  Will get help right away if you are not doing well or get worse.   This information is not intended to replace advice given to you by your health care provider. Make sure you discuss any questions you have with your health care provider.   Document Released: 09/15/2005 Document Revised:  12/08/2011 Document Reviewed: 07/02/2011 Elsevier Interactive Patient Education [employer]

## 2015-11-06 NOTE — MAU Provider Note (Signed)
History     CSN: [id number] Arrival date and time: 11/06/15 0941 First Provider Initiated Contact with Patient 11/06/15 1015      Chief Complaint  Patient presents with  . Emesis  . Diarrhea  . Abdominal Cramping   HPI She presents complaining of Emesis; Diarrhea; and Abdominal Cramping  Onset is described as sudden and has been present since 9 PM last night. Had many episodes of diarrhea and vomiting last night. Also developed body pain (especially legs and back), chills, headache, and constant lower abdominal pain. Has not been able to keep down any fluids or food since this began. 7yo son had episode of emesis and soiled himself 2 nights ago. No recent antibiotics and food out of the ordinary. Has had morning sickness throughout pregnancy but current symptoms are acute worsening from baseline. Since being in MAU, patient has been able to tolerate some ice chips.  At time of interview, also endorses new L ear pain without discharge, mild SOB, and chest pain with breathing. Had 1 episode of dark brown urine in ED but denies other changes in urine color or dysuria. Hx of UTIs but no kidney stones.  Denies fevers, CTX, LOF, or vaginal bleeding. Good fetal movement.    Past Medical History  Diagnosis Date  . Depression   . GERD (gastroesophageal reflux disease)   . UTI (urinary tract infection) in pregnancy in first trimester   . Herpes   . Infection     UTI    Past Surgical History  Procedure Laterality Date  . Dilation and curettage of uterus      Family History  Problem Relation Age of Onset  . Hypertension Maternal Grandmother   . Depression Maternal Grandmother   . Heart disease Maternal Grandmother   . Cancer Maternal Grandfather     lung    Social History  Substance Use Topics  . Smoking status: Current Every Day Smoker -- 0.25 packs/day for 3 years    Types: Cigarettes    Start date: 04/29/2007  . Smokeless tobacco: Never Used     Comment: Declines help  .  Alcohol Use: No    Allergies: No Known Allergies  Prescriptions prior to admission  Medication Sig Dispense Refill Last Dose  . Prenatal Vit-Min-FA-Fish Oil ([employer] PRENATAL GUMMY) 0.4-113.5 MG CHEW Chew 2 each by mouth daily.   Past Week at Unknown time  . acyclovir (ZOVIRAX) 200 MG capsule Take two tablets three times a day for 5 days for each outbreak. (Patient taking differently: Take 400 mg by mouth 3 (three) times daily as needed (for outbreak). ) 90 capsule 1 Not Taking  . ondansetron (ZOFRAN ODT) 4 MG disintegrating tablet Take 1 tablet (4 mg total) by mouth every 8 (eight) hours as needed for nausea or vomiting. (Patient not taking: Reported on 09/03/2015) 20 tablet 0 Not Taking at Unknown time    ROS Physical Exam   Blood pressure 127/74, pulse 116, temperature 98.3 F (36.8 C), temperature source Oral, resp. rate 20, last menstrual period 03/30/2015.  Physical Exam Gen: ill appearing but nontoxic, reclining in bed, sighing at end of breath HEENT: No palpable lymphadenopathy, no pharyngeal erythema or exudate,  normal TMs, dry MM Respiratory: normal effort, CTAB bilaterally, no wheezing/crackles CV: tachycardic mild, no m/r/g; brisk cap refill  Abdominal: reports tenderness to palpation in epigastric region and suprapubic region, no rebound, no guarding, + BS Back: no CVA tenderness, reports tenderness to palpation in lumbar paraspinal muscles  LE: no edema,  good DP pulses   MAU Course  Procedures  MDM Symptoms likely consistent with viral gastroenteritis especially with history of sick contact and acute onset of symptoms.  UA significant for signs of dehydration: elevated Specific gravity, >80 ketones with protein,  Given total of 1L x3 LR boluses, Zofran x 1 Tylenol for pain  Phenergan x 1 for nausea  Ordered CBC, CMP for further evaluation: CBC with mild leukocytosis   Assessment and Plan  Patient has not had further episodes of emesis or diarrhea while being observed  in MAU. Has been tolerating Ginger Ale. As noted above symptoms are consistent with viral gastroenteritis. Given return precautions.  - Prescribed Zofran, Percocet (5 tabs), and Ambien (5 tabs) PRN for difficulty with sleeping  Palma Holter 11/06/2015, 1:41 PM   OB fellow attestation: I have seen and examined this patient; I agree with above documentation in the resident's note.   EMORY LEAVER is a 25 y.o. [phone] reporting nausea/vomiting and diarrhea with known sick contract.  +FM, denies LOF, VB, contractions, vaginal discharge.  PE: BP 96/44 mmHg  Pulse 108  Temp(Src) 98.2 F (36.8 C) (Oral)  Resp 18  SpO2 98%  LMP 03/30/2015 Gen: calm comfortable, NAD Resp: normal effort, no distress Abd: gravid  ROS, labs, PMH reviewed NST reactive x2  Plan: -received IVF here (3L), zofran, phenergan. Patient improved and tolerating PO (drank several cups of ginger ale) and ate crackers. She has not has any diarrhea for nearly 6 + hours of observation.  - home with return precautions - Rx for ambien due to lack of sleep (limited#) - Rx for percocet #5 (limited # due to red flag behavior including asking for narcotics like IV dilaudid, h/o asking for narcotics in the outpatient setting).   Federico Flake, MD 2:31 PM

## 2015-11-06 NOTE — MAU Note (Addendum)
Ongoing problems with vomiting. Ran out of zofran 4 days ago, planned to call MD yesterday and didn't.  Started having diarrhea last night.  abd is hurting- cramping. (came in by EMS)

## 2015-11-07 ENCOUNTER — Ambulatory Visit (HOSPITAL_COMMUNITY): Payer: Medicaid Other | Attending: Maternal and Fetal Medicine

## 2015-11-28 ENCOUNTER — Encounter (HOSPITAL_COMMUNITY): Payer: Self-pay

## 2015-11-28 ENCOUNTER — Inpatient Hospital Stay (HOSPITAL_COMMUNITY)
Admission: AD | Admit: 2015-11-28 | Discharge: 2015-11-28 | Disposition: A | Discharge status: Home / Self Care | Payer: Medicaid Other | Source: Ambulatory Visit | Attending: Obstetrics and Gynecology | Admitting: Obstetrics and Gynecology

## 2015-11-28 DIAGNOSIS — O99333 Smoking (tobacco) complicating pregnancy, third trimester: Secondary | ICD-10-CM | POA: Insufficient documentation

## 2015-11-28 DIAGNOSIS — F329 Major depressive disorder, single episode, unspecified: Secondary | ICD-10-CM | POA: Insufficient documentation

## 2015-11-28 DIAGNOSIS — O4703 False labor before 37 completed weeks of gestation, third trimester: Secondary | ICD-10-CM | POA: Insufficient documentation

## 2015-11-28 DIAGNOSIS — Z6791 Unspecified blood type, Rh negative: Secondary | ICD-10-CM | POA: Insufficient documentation

## 2015-11-28 DIAGNOSIS — B009 Herpesviral infection, unspecified: Secondary | ICD-10-CM | POA: Insufficient documentation

## 2015-11-28 DIAGNOSIS — R109 Unspecified abdominal pain: Secondary | ICD-10-CM | POA: Diagnosis present

## 2015-11-28 DIAGNOSIS — O9989 Other specified diseases and conditions complicating pregnancy, childbirth and the puerperium: Secondary | ICD-10-CM

## 2015-11-28 DIAGNOSIS — O30003 Twin pregnancy, unspecified number of placenta and unspecified number of amniotic sacs, third trimester: Secondary | ICD-10-CM | POA: Insufficient documentation

## 2015-11-28 DIAGNOSIS — J111 Influenza due to unidentified influenza virus with other respiratory manifestations: Secondary | ICD-10-CM | POA: Diagnosis not present

## 2015-11-28 DIAGNOSIS — K219 Gastro-esophageal reflux disease without esophagitis: Secondary | ICD-10-CM | POA: Diagnosis not present

## 2015-11-28 DIAGNOSIS — Z3A31 31 weeks gestation of pregnancy: Secondary | ICD-10-CM

## 2015-11-28 DIAGNOSIS — M545 Low back pain: Secondary | ICD-10-CM | POA: Diagnosis present

## 2015-11-28 DIAGNOSIS — O26893 Other specified pregnancy related conditions, third trimester: Secondary | ICD-10-CM | POA: Diagnosis not present

## 2015-11-28 DIAGNOSIS — A6000 Herpesviral infection of urogenital system, unspecified: Secondary | ICD-10-CM | POA: Insufficient documentation

## 2015-11-28 DIAGNOSIS — O479 False labor, unspecified: Secondary | ICD-10-CM

## 2015-11-28 DIAGNOSIS — J029 Acute pharyngitis, unspecified: Secondary | ICD-10-CM | POA: Diagnosis present

## 2015-11-28 DIAGNOSIS — O360190 Maternal care for anti-D [Rh] antibodies, unspecified trimester, not applicable or unspecified: Secondary | ICD-10-CM

## 2015-11-28 DIAGNOSIS — O98813 Other maternal infectious and parasitic diseases complicating pregnancy, third trimester: Secondary | ICD-10-CM | POA: Insufficient documentation

## 2015-11-28 LAB — COMPREHENSIVE METABOLIC PANEL
ALK PHOS: 173 U/L — AB (ref 38–126)
ALT: 14 U/L (ref 14–54)
AST: 34 U/L (ref 15–41)
Albumin: 2.4 g/dL — ABNORMAL LOW (ref 3.5–5.0)
Anion gap: 8 (ref 5–15)
BUN: <5 mg/dL — ABNORMAL LOW (ref 6–20)
CALCIUM: 8.7 mg/dL — AB (ref 8.9–10.3)
CO2: 21 mmol/L — AB (ref 22–32)
CREATININE: 0.43 mg/dL — AB (ref 0.44–1.00)
Chloride: 105 mmol/L (ref 101–111)
Glucose, Bld: 78 mg/dL (ref 65–99)
Potassium: 3.7 mmol/L (ref 3.5–5.1)
Sodium: 134 mmol/L — ABNORMAL LOW (ref 135–145)
Total Bilirubin: 0.7 mg/dL (ref 0.3–1.2)
Total Protein: 5.9 g/dL — ABNORMAL LOW (ref 6.5–8.1)

## 2015-11-28 LAB — CBC WITH DIFFERENTIAL/PLATELET
Basophils Absolute: 0 10*3/uL (ref 0.0–0.1)
Basophils Relative: 0 %
Eosinophils Absolute: 0.2 10*3/uL (ref 0.0–0.7)
Eosinophils Relative: 2 %
HCT: 26 % — ABNORMAL LOW (ref 36.0–46.0)
HEMOGLOBIN: 8.1 g/dL — AB (ref 12.0–15.0)
LYMPHS ABS: 2.2 10*3/uL (ref 0.7–4.0)
LYMPHS PCT: 19 %
MCH: 24.6 pg — AB (ref 26.0–34.0)
MCHC: 31.2 g/dL (ref 30.0–36.0)
MCV: 79 fL (ref 78.0–100.0)
Monocytes Absolute: 0.3 10*3/uL (ref 0.1–1.0)
Monocytes Relative: 3 %
NEUTROS ABS: 8.7 10*3/uL — AB (ref 1.7–7.7)
NEUTROS PCT: 76 %
Platelets: 226 10*3/uL (ref 150–400)
RBC: 3.29 MIL/uL — AB (ref 3.87–5.11)
RDW: 15.1 % (ref 11.5–15.5)
WBC: 11.4 10*3/uL — AB (ref 4.0–10.5)

## 2015-11-28 LAB — URINALYSIS, ROUTINE W REFLEX MICROSCOPIC
BILIRUBIN URINE: NEGATIVE
Glucose, UA: NEGATIVE mg/dL
HGB URINE DIPSTICK: NEGATIVE
Ketones, ur: 15 mg/dL — AB
Nitrite: NEGATIVE
PROTEIN: NEGATIVE mg/dL
Specific Gravity, Urine: 1.015 (ref 1.005–1.030)
pH: 7.5 (ref 5.0–8.0)

## 2015-11-28 LAB — INFLUENZA PANEL BY PCR (TYPE A & B)
H1N1 flu by pcr: NOT DETECTED
INFLAPCR: NEGATIVE
Influenza B By PCR: NEGATIVE

## 2015-11-28 LAB — URINE MICROSCOPIC-ADD ON

## 2015-11-28 MED ORDER — RANITIDINE HCL 150 MG PO TABS
150.0000 mg | ORAL_TABLET | Freq: Two times a day (BID) | ORAL | Status: DC
Start: 2015-11-28 — End: 2015-12-22

## 2015-11-28 MED ORDER — ACETAMINOPHEN-CODEINE #3 300-30 MG PO TABS
1.0000 | ORAL_TABLET | Freq: Once | ORAL | Status: DC
  Filled 2015-11-28: qty 1

## 2015-11-28 MED ORDER — ONDANSETRON 4 MG PO TBDP
4.0000 mg | ORAL_TABLET | Freq: Once | ORAL | Status: AC
  Administered 2015-11-28: 4 mg via ORAL
  Filled 2015-11-28: qty 1

## 2015-11-28 MED ORDER — ACETAMINOPHEN 500 MG PO TABS
1000.0000 mg | ORAL_TABLET | Freq: Once | ORAL | Status: AC
  Administered 2015-11-28: 1000 mg via ORAL
  Filled 2015-11-28: qty 2

## 2015-11-28 MED ORDER — LACTATED RINGERS IV BOLUS (SEPSIS)
1000.0000 mL | Freq: Once | INTRAVENOUS | Status: AC
  Administered 2015-11-28: 1000 mL via INTRAVENOUS

## 2015-11-28 MED ORDER — OSELTAMIVIR PHOSPHATE 75 MG PO CAPS: 75.0000 mg | ORAL_CAPSULE | Freq: Two times a day (BID) | ORAL | Status: DC

## 2015-11-28 MED ORDER — RHO D IMMUNE GLOBULIN 1500 UNIT/2ML IJ SOSY
300.0000 ug | PREFILLED_SYRINGE | Freq: Once | INTRAMUSCULAR | Status: AC
  Administered 2015-11-28: 300 ug via INTRAMUSCULAR
  Filled 2015-11-28: qty 2

## 2015-11-28 NOTE — Discharge Instructions (Signed)
Abdominal Pain During Pregnancy Abdominal pain is common in pregnancy. Most of the time, it does not cause harm. There are many causes of abdominal pain. Some causes are more serious than others. Some of the causes of abdominal pain in pregnancy are easily diagnosed. Occasionally, the diagnosis takes time to understand. Other times, the cause is not determined. Abdominal pain can be a sign that something is very wrong with the pregnancy, or the pain may have nothing to do with the pregnancy at all. For this reason, always tell your health care provider if you have any abdominal discomfort. HOME CARE INSTRUCTIONS  Monitor your abdominal pain for any changes. The following actions may help to alleviate any discomfort you are experiencing:  Do not have sexual intercourse or put anything in your vagina until your symptoms go away completely.  Get plenty of rest until your pain improves.  Drink clear fluids if you feel nauseous. Avoid solid food as long as you are uncomfortable or nauseous.  Only take over-the-counter or prescription medicine as directed by your health care provider.  Keep all follow-up appointments with your health care provider. SEEK IMMEDIATE MEDICAL CARE IF:  You are bleeding, leaking fluid, or passing tissue from the vagina.  You have increasing pain or cramping.  You have persistent vomiting.  You have painful or bloody urination.  You have a fever.  You notice a decrease in your baby's movements.  You have extreme weakness or feel faint.  You have shortness of breath, with or without abdominal pain.  You develop a severe headache with abdominal pain.  You have abnormal vaginal discharge with abdominal pain.  You have persistent diarrhea.  You have abdominal pain that continues even after rest, or gets worse. MAKE SURE YOU:   Understand these instructions.  Will watch your condition.  Will get help right away if you are not doing well or get worse.     This information is not intended to replace advice given to you by your health care provider. Make sure you discuss any questions you have with your health care provider.   Document Released: 09/15/2005 Document Revised: 07/06/2013 Document Reviewed: 04/14/2013 Elsevier Interactive Patient Education 2016 [employer]. Influenza, Adult Influenza (flu) is an infection in the mouth, nose, and throat (respiratory tract) caused by a virus. The flu can make you feel very ill. Influenza spreads easily from person to person (contagious).  HOME CARE   Only take medicines as told by your doctor.  Use a cool mist humidifier to make breathing easier.  Get plenty of rest until your fever goes away. This usually takes 3 to 4 days.  Drink enough fluids to keep your pee (urine) clear or pale yellow.  Cover your mouth and nose when you cough or sneeze.  Wash your hands well to avoid spreading the flu.  Stay home from work or school until your fever has been gone for at least 1 full day.  Get a flu shot every year. GET HELP RIGHT AWAY IF:   You have trouble breathing or feel short of breath.  Your skin or nails turn blue.  You have severe neck pain or stiffness.  You have a severe headache, facial pain, or earache.  Your fever gets worse or keeps coming back.  You feel sick to your stomach (nauseous), throw up (vomit), or have watery poop (diarrhea).  You have chest pain.  You have a deep cough that gets worse, or you cough up more thick spit (  mucus). MAKE SURE YOU:   Understand these instructions.  Will watch your condition.  Will get help right away if you are not doing well or get worse.   This information is not intended to replace advice given to you by your health care provider. Make sure you discuss any questions you have with your health care provider.   Document Released: 06/24/2008 Document Revised: 10/06/2014 Document Reviewed: 12/15/2011 Elsevier Interactive Patient  Education [employer]

## 2015-11-28 NOTE — MAU Note (Signed)
Pt presents to MAU with complaints of lower abdominal discomfort, back pain and body aches throughout the night. Pt has not had the flu shot and her son had the flu last week.Denies any vaginal bleeding or LOF

## 2015-11-28 NOTE — Progress Notes (Signed)
Pt sitting straight up in bed, pt not compliant with fetal monitoring

## 2015-11-28 NOTE — Progress Notes (Signed)
Pt offered Tylenol #3 for pain due to repeated requests for pain medication. Pt states she cannot take it as it makes her nauseous and she is already feeling that way and doesn't want to take something by mouth. She requests IV pain medication. Dr Ashok Pall notified.

## 2015-11-28 NOTE — MAU Provider Note (Signed)
MAU HISTORY AND PHYSICAL  Chief Complaint:  Abdominal Pain   April [name] is a 25 y.o.  [id number] with IUP at [redacted]w[redacted]d presenting for Abdominal Pain  Began feeling ill yesterday. Initially felt achy all over. With runny nose and sore throat. Low back pain. Stomach started hurting yesterday. Low abdominal, bilateral. No contractions. No bleeding or leakage of fluid. Most concerned about body aches. 78 year-old son sick with similar symptoms, took to ED, told has flu. Vomited once this morning, has had n/v throughout pregnancy.    Past Medical History  Diagnosis Date  . Depression   . GERD (gastroesophageal reflux disease)   . UTI (urinary tract infection) in pregnancy in first trimester   . Herpes   . Infection     UTI    Past Surgical History  Procedure Laterality Date  . Dilation and curettage of uterus      Family History  Problem Relation Age of Onset  . Hypertension Maternal Grandmother   . Depression Maternal Grandmother   . Heart disease Maternal Grandmother   . Cancer Maternal Grandfather     lung    Social History  Substance Use Topics  . Smoking status: Current Every Day Smoker -- 0.25 packs/day for 3 years    Types: Cigarettes    Start date: 04/29/2007  . Smokeless tobacco: Never Used     Comment: Declines help  . Alcohol Use: No    No Known Allergies  Prescriptions prior to admission  Medication Sig Dispense Refill Last Dose  . acyclovir (ZOVIRAX) 200 MG capsule Take two tablets three times a day for 5 days for each outbreak. (Patient taking differently: Take 400 mg by mouth 3 (three) times daily as needed (for outbreak). ) 90 capsule 1 11/27/2015 at Unknown time  . ondansetron (ZOFRAN) 4 MG tablet Take 1 tablet (4 mg total) by mouth every 8 (eight) hours as needed for nausea or vomiting. 20 tablet 0 11/27/2015 at Unknown time  . oxyCODONE-acetaminophen (ROXICET) 5-325 MG tablet Take 1 tablet by mouth every 6 (six) hours as needed for severe pain. 5 tablet 0  11/27/2015 at Unknown time  . Prenatal Vit-Min-FA-Fish Oil ([employer] PRENATAL GUMMY) 0.4-113.5 MG CHEW Chew 2 each by mouth daily.   Past Week at Unknown time  . zolpidem (AMBIEN) 5 MG tablet Take 1 tablet (5 mg total) by mouth at bedtime as needed for sleep. 5 tablet 0 Past Month at Unknown time    Review of Systems - Negative except for what is mentioned in HPI.  Physical Exam  Blood pressure 122/73, pulse 108, temperature 97.9 F (36.6 C), resp. rate 18, height 5\' 1"  (1.549 m), weight 139 lb (63.05 kg), last menstrual period 03/30/2015. GENERAL: Well-developed, well-nourished female in no acute distress.  LUNGS: Clear to auscultation bilaterally.  HEART: Regular rate and rhythm. ABDOMEN: Soft, nontender, nondistended, gravid.  EXTREMITIES: Nontender, no edema, 2+ distal pulses. Cervical Exam: closed/thick/high FHT:  140/mod/+a/-d, 150/mod/+a/-d Contractions: sporadic   Labs: Results for orders placed or performed during the hospital encounter of 11/28/15 (from the past 24 hour(s))  Urinalysis, Routine w reflex microscopic (not at [facility])   Collection Time: 11/28/15  4:35 PM  Result Value Ref Range   Color, Urine YELLOW YELLOW   APPearance CLEAR CLEAR   Specific Gravity, Urine 1.015 1.005 - 1.030   pH 7.5 5.0 - 8.0   Glucose, UA NEGATIVE NEGATIVE mg/dL   Hgb urine dipstick NEGATIVE NEGATIVE   Bilirubin Urine NEGATIVE NEGATIVE  Ketones, ur 15 (A) NEGATIVE mg/dL   Protein, ur NEGATIVE NEGATIVE mg/dL   Nitrite NEGATIVE NEGATIVE   Leukocytes, UA LARGE (A) NEGATIVE  Urine microscopic-add on   Collection Time: 11/28/15  4:35 PM  Result Value Ref Range   Squamous Epithelial / LPF 6-30 (A) NONE SEEN   WBC, UA 6-30 0 - 5 WBC/hpf   RBC / HPF 0-5 0 - 5 RBC/hpf   Bacteria, UA MANY (A) NONE SEEN  Influenza panel by PCR (type A & B, H1N1)   Collection Time: 11/28/15  4:55 PM  Result Value Ref Range   Influenza A By PCR NEGATIVE NEGATIVE   Influenza B By PCR NEGATIVE NEGATIVE   H1N1 flu by  pcr NOT DETECTED NOT DETECTED  CBC with Differential/Platelet   Collection Time: 11/28/15  5:20 PM  Result Value Ref Range   WBC 11.4 (H) 4.0 - 10.5 K/uL   RBC 3.29 (L) 3.87 - 5.11 MIL/uL   Hemoglobin 8.1 (L) 12.0 - 15.0 g/dL   HCT 16.1 (L) 09.6 - 04.5 %   MCV 79.0 78.0 - 100.0 fL   MCH 24.6 (L) 26.0 - 34.0 pg   MCHC 31.2 30.0 - 36.0 g/dL   RDW 40.9 81.1 - 91.4 %   Platelets 226 150 - 400 K/uL   Neutrophils Relative % 76 %   Neutro Abs 8.7 (H) 1.7 - 7.7 K/uL   Lymphocytes Relative 19 %   Lymphs Abs 2.2 0.7 - 4.0 K/uL   Monocytes Relative 3 %   Monocytes Absolute 0.3 0.1 - 1.0 K/uL   Eosinophils Relative 2 %   Eosinophils Absolute 0.2 0.0 - 0.7 K/uL   Basophils Relative 0 %   Basophils Absolute 0.0 0.0 - 0.1 K/uL  Comprehensive metabolic panel   Collection Time: 11/28/15  5:20 PM  Result Value Ref Range   Sodium 134 (L) 135 - 145 mmol/L   Potassium 3.7 3.5 - 5.1 mmol/L   Chloride 105 101 - 111 mmol/L   CO2 21 (L) 22 - 32 mmol/L   Glucose, Bld 78 65 - 99 mg/dL   BUN <5 (L) 6 - 20 mg/dL   Creatinine, Ser 7.82 (L) 0.44 - 1.00 mg/dL   Calcium 8.7 (L) 8.9 - 10.3 mg/dL   Total Protein 5.9 (L) 6.5 - 8.1 g/dL   Albumin 2.4 (L) 3.5 - 5.0 g/dL   AST 34 15 - 41 U/L   ALT 14 14 - 54 U/L   Alkaline Phosphatase 173 (H) 38 - 126 U/L   Total Bilirubin 0.7 0.3 - 1.2 mg/dL   GFR calc non Af Amer >60 >60 mL/min   GFR calc Af Amer >60 >60 mL/min   Anion gap 8 5 - 15  Rh IG workup (includes ABO/Rh)   Collection Time: 11/28/15  5:20 PM  Result Value Ref Range   Gestational Age(Wks) 31    ABO/RH(D) O NEG    Antibody Screen NEG    Fetal Screen NEG    Unit Number [id number]    Blood Component Type RHIG    Unit division 00    Status of Unit ISSUED    Transfusion Status OK TO TRANSFUSE     Imaging Studies:  No results found.  Assessment: April [name] is  25 y.o. G4P1021 at [redacted]w[redacted]d presents with...  # Influenza: rapid flu negative but symptoms (congestion, body aches) consistent  with the disease, is epidemic currently, and patient's son recently diagnosed with influenza. Discussed risks benefits of starting  tamifly (symptom onset is less than 48 hours) and shared decision to start. Lungs clear, overall well appearing, tolerating fluids, cbc and cmp unremarkable, so do not think meeds admission criteria. NST reactive both fetuses. - tamifly 75 mg po bid - respiratory return precautions - push fluids  # GERD - tums not sufficient. Nightly symptoms - ranitidine  # Braxton hicks contractions - uterine irritability, occasional contraction. Cervix closed/thick/high. Reactive NSTs. - tylenol, warm bath, push fluids  # Twin pregnancy - ob appt tomorrow, needs u/s ordered as is overdue. Also due for gtt. Stressed importance of this visit.  # Rh negative - rhogam given today  Silvano Bilis 3/1/20178:29 PM

## 2015-11-29 ENCOUNTER — Encounter: Payer: Medicaid Other | Admitting: Obstetrics & Gynecology

## 2015-11-29 ENCOUNTER — Encounter: Payer: Self-pay | Admitting: Obstetrics & Gynecology

## 2015-11-29 LAB — RH IG WORKUP (INCLUDES ABO/RH)
ABO/RH(D): O NEG
Antibody Screen: NEGATIVE
Fetal Screen: NEGATIVE
GESTATIONAL AGE(WKS): 31
Unit division: 0

## 2015-12-06 ENCOUNTER — Ambulatory Visit (INDEPENDENT_AMBULATORY_CARE_PROVIDER_SITE_OTHER): Payer: Medicaid Other | Admitting: Obstetrics & Gynecology

## 2015-12-06 VITALS — BP 119/68 | HR 86 | Temp 98.0°F | Wt 137.7 lb

## 2015-12-06 DIAGNOSIS — O30009 Twin pregnancy, unspecified number of placenta and unspecified number of amniotic sacs, unspecified trimester: Secondary | ICD-10-CM

## 2015-12-06 DIAGNOSIS — O30003 Twin pregnancy, unspecified number of placenta and unspecified number of amniotic sacs, third trimester: Secondary | ICD-10-CM

## 2015-12-06 DIAGNOSIS — O99343 Other mental disorders complicating pregnancy, third trimester: Secondary | ICD-10-CM | POA: Diagnosis not present

## 2015-12-06 DIAGNOSIS — F329 Major depressive disorder, single episode, unspecified: Secondary | ICD-10-CM | POA: Diagnosis not present

## 2015-12-06 DIAGNOSIS — F17210 Nicotine dependence, cigarettes, uncomplicated: Secondary | ICD-10-CM

## 2015-12-06 DIAGNOSIS — O99333 Smoking (tobacco) complicating pregnancy, third trimester: Secondary | ICD-10-CM

## 2015-12-06 LAB — POCT URINALYSIS DIP (DEVICE)
GLUCOSE, UA: NEGATIVE mg/dL
Ketones, ur: 15 mg/dL — AB
NITRITE: NEGATIVE
Protein, ur: 30 mg/dL — AB
SPECIFIC GRAVITY, URINE: 1.025 (ref 1.005–1.030)
UROBILINOGEN UA: 1 mg/dL (ref 0.0–1.0)
pH: 6 (ref 5.0–8.0)

## 2015-12-06 MED ORDER — PANTOPRAZOLE SODIUM 40 MG PO TBEC: 40.0000 mg | DELAYED_RELEASE_TABLET | Freq: Every day | ORAL | Status: DC

## 2015-12-06 NOTE — Progress Notes (Signed)
Moderate leukocytes noted on urinalysis States doesn't feel well at all- was tested for flu and treated recently.  Feels like baby boy not moving as much as girl.c/o of a lot of pain in back and contractions several times a day. Had " bumps with itching on her abdomen after tamiflu.

## 2015-12-06 NOTE — Patient Instructions (Signed)
Multiple Pregnancy °Having a multiple pregnancy means you are carrying twins, triplets, or more. The majority of multiple pregnancies are twins. Naturally conceiving triplets or more (higher-order multiples) is quite rare. °Multiple pregnancies can sometimes be riskier than single pregnancies. You are more likely to have certain problems. For example, you are at higher risk for high blood pressure during pregnancy (preeclampsia). You may need to have more frequent appointments for prenatal care.  °CAUSES  °Sometimes your body releases more than one egg at a time. Having more than one egg fertilized by more than one sperm is the most common type of multiple pregnancy. Twins produced this way are fraternal. They are no more alike than other siblings. °Multiple pregnancies also result when one sperm fertilizes one egg and then that egg divides into more than one embryo. These are identical twins or triplets. Identical multiples are always the same gender and look very much alike. °RISK FACTORS °You may be at higher risk for having a multiple pregnancy if: °· You are older than 35. °· You have already had four or more children. °· You have a family history of multiple pregnancy. °· You have had fertility treatment. °SIGNS AND SYMPTOMS °Early signs and symptoms of multiple pregnancy include: °· Rapid weight gain in the first 3 months of pregnancy (first trimester). °· The uterus measuring larger than normal for your stage of pregnancy. °· More severe nausea. °· A higher-than-normal level of human chorionic gonadotropin (HCG). This is a hormone that your body produces in early pregnancy. °DIAGNOSIS  °Your health care provider may suspect a multiple pregnancy from your symptoms and a physical exam. The results of your HCG blood test may also suggest a multiple pregnancy. Your health care provider will confirm that you are carrying multiples by doing an ultrasound exam. This exam involves using sound waves and a computer to  get an image of the inside of your uterus. An ultrasound exam can confirm a multiple pregnancy after you have been pregnant for 6-8 weeks. °TREATMENT  °If you have a multiple pregnancy, you may need: °· Early screening for possible birth defects or genetic abnormalities. °· Frequent pelvic exams to check for signs of early labor. °· Frequent ultrasound exams during the second trimester. °· Frequent checks for high blood pressure and for protein in your urine. These are signs of preeclampsia. °· An anti-inflammatory medicine (corticosteroid) if you go into early labor. This medicine helps your babies' lungs mature. °· Magnesium sulfate to prevent cerebral palsy in your babies if you are expected to deliver before 32 weeks. °· Cesarean delivery if vaginal delivery is too dangerous. °HOME CARE INSTRUCTIONS  °Because your pregnancy may be considered high risk, you have to work closely with your health care team. You may also need to make some lifestyle changes. These may include the following: °· Increase your nutrition. °¨ Gaining about 40-50 lb (18-23 kg) is recommended when you are pregnant with multiples. Try to gain 1 lb (0.5 kg) a week for the first 20 weeks of your pregnancy. °¨ Your health care provider will let you know how many calories you should add to your diet each day. °¨ Eat healthy snacks often throughout the day. This can add calories and reduce nausea. °· Drink enough fluid to keep your urine clear or pale yellow. °· Take your prenatal vitamins. °· Take 1500-2000 mg of calcium daily starting at the 20th week of pregnancy until you deliver your babies. °· By 20-24 weeks, you may need to   limit your activities. °¨ Avoid strenuous activity and work. °¨ Ask your health care provider when you should stop having sexual intercourse. °¨ Rest often. °· Do not smoke. °· Do not drink alcohol. °· Arrange for extra help around the house. °· Keep all your prenatal appointments. °SEEK MEDICAL CARE IF: °· You have  dizziness. °· You have mild pelvic cramps, pelvic pressure, or nagging pain in the abdominal or low back area. °· You have persistent nausea, vomiting, or diarrhea. °· You have a bad smelling vaginal discharge. °· You have pain with urination. °· You are having trouble gaining weight. °· You notice increased swelling in your face, hands, legs, or ankles. °· You have a fever. °SEEK IMMEDIATE MEDICAL CARE IF:  °· You are leaking fluid from your vagina. °· You have spotting or bleeding from your vagina. °· You have severe abdominal cramping or pain. °· You have rapid weight gain or loss. °· You vomit blood or material that looks like coffee grounds. °· You are exposed to German measles and have never had them. °· You are exposed to fifth disease or chickenpox. °· You develop a severe headache. °· You have shortness of breath. °  °This information is not intended to replace advice given to you by your health care provider. Make sure you discuss any questions you have with your health care provider. °  °Document Released: 06/24/2008 Document Revised: 10/06/2014 Document Reviewed: 08/19/2013 °Elsevier Interactive Patient Education ©2016 [employer] ° °

## 2015-12-06 NOTE — Progress Notes (Signed)
Follow-up ultrasound scheduled for 03/16 @ 1:15pm.

## 2015-12-06 NOTE — Progress Notes (Signed)
Subjective:  April [name] is a 25 y.o. G4P1021 at 5243w2d being seen today for ongoing prenatal care.  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  Patient reports backache, heartburn and nausea.  Contractions: Irregular. Vag. Bleeding: None.  Movement: Present. Denies leaking of fluid.   The following portions of the patient's history were reviewed and updated as appropriate: allergies, current medications, past family history, past medical history, past social history, past surgical history and problem list. Problem list updated.  Objective:   Filed Vitals:   12/06/15 1006  BP: 119/68  Pulse: 86  Temp: 98 F (36.7 C)  Weight: 137 lb 11.2 oz (62.46 kg)    Fetal Status: Fetal Heart Rate (bpm): 143//147   Movement: Present     General:  Alert, oriented and cooperative. Patient is in no acute distress.  Skin: Skin is warm and dry. No rash noted.   Cardiovascular: Normal heart rate noted  Respiratory: Normal respiratory effort, no problems with respiration noted  Abdomen: Soft, gravid, appropriate for gestational age. Pain/Pressure: Present     Pelvic: Vag. Bleeding: None Vag D/C Character: White   Cervical exam deferred        Extremities: Normal range of motion.  Edema: Trace  Mental Status: Normal mood and affect. Normal behavior. Normal judgment and thought content.   Urinalysis: Urine Protein: 1+ Urine Glucose: Negative  Assessment and Plan:  Pregnancy: G4P1021 at 5643w2d  1. Twin pregnancy, antepartum condition or complication Needs f/u [place] - Glucose Tolerance, 1 HR (50g) w/o Fasting - RPR - HIV antibody (with reflex) - US OB Follow Up; Future Reflux and nausea- add  Protonix Preterm labor symptoms and general obstetric precautions including but not limited to vaginal bleeding, contractions, leaking of fluid and fetal movement were reviewed in detail with the patient. Please refer to After Visit Summary for other counseling recommendations.  Return in about 1 week (around 12/13/2015).   Adam PhenixJames G Paytyn Mesta, MD

## 2015-12-07 LAB — GLUCOSE TOLERANCE, 1 HOUR (50G) W/O FASTING: Glucose, 1 Hr, gestational: 100 mg/dL (ref <140)

## 2015-12-07 LAB — HIV ANTIBODY (ROUTINE TESTING W REFLEX): HIV 1&2 Ab, 4th Generation: NONREACTIVE

## 2015-12-07 NOTE — MAU Note (Signed)
Another hospital called requesting pt records.  Records printed.  Will fax to other hospital after receiving release form.

## 2015-12-08 LAB — RPR

## 2015-12-13 ENCOUNTER — Ambulatory Visit (HOSPITAL_COMMUNITY)
Admission: RE | Admit: 2015-12-13 | Discharge: 2015-12-13 | Disposition: A | Discharge status: Home / Self Care | Payer: Medicaid Other | Source: Ambulatory Visit | Attending: Obstetrics & Gynecology | Admitting: Obstetrics & Gynecology

## 2015-12-13 VITALS — BP 128/87 | HR 105 | Wt 140.0 lb

## 2015-12-13 DIAGNOSIS — O30009 Twin pregnancy, unspecified number of placenta and unspecified number of amniotic sacs, unspecified trimester: Secondary | ICD-10-CM

## 2015-12-13 DIAGNOSIS — Z3A33 33 weeks gestation of pregnancy: Secondary | ICD-10-CM | POA: Diagnosis not present

## 2015-12-13 DIAGNOSIS — O30042 Twin pregnancy, dichorionic/diamniotic, second trimester: Secondary | ICD-10-CM | POA: Insufficient documentation

## 2015-12-13 DIAGNOSIS — O9989 Other specified diseases and conditions complicating pregnancy, childbirth and the puerperium: Secondary | ICD-10-CM

## 2015-12-13 DIAGNOSIS — M549 Dorsalgia, unspecified: Secondary | ICD-10-CM

## 2015-12-13 DIAGNOSIS — O283 Abnormal ultrasonic finding on antenatal screening of mother: Secondary | ICD-10-CM | POA: Diagnosis not present

## 2015-12-13 DIAGNOSIS — O99891 Other specified diseases and conditions complicating pregnancy: Secondary | ICD-10-CM

## 2015-12-13 DIAGNOSIS — O30049 Twin pregnancy, dichorionic/diamniotic, unspecified trimester: Secondary | ICD-10-CM

## 2015-12-17 ENCOUNTER — Inpatient Hospital Stay (HOSPITAL_COMMUNITY)
Admission: AD | Admit: 2015-12-17 | Discharge: 2015-12-17 | Disposition: A | Discharge status: Other Acute Inpatient Hospital | Payer: Medicaid Other | Source: Ambulatory Visit | Attending: Family Medicine | Admitting: Family Medicine

## 2015-12-17 ENCOUNTER — Encounter (HOSPITAL_COMMUNITY): Payer: Self-pay | Admitting: *Deleted

## 2015-12-17 DIAGNOSIS — O4703 False labor before 37 completed weeks of gestation, third trimester: Secondary | ICD-10-CM

## 2015-12-17 DIAGNOSIS — Z3A33 33 weeks gestation of pregnancy: Secondary | ICD-10-CM | POA: Diagnosis not present

## 2015-12-17 DIAGNOSIS — Z3A34 34 weeks gestation of pregnancy: Secondary | ICD-10-CM

## 2015-12-17 DIAGNOSIS — O26892 Other specified pregnancy related conditions, second trimester: Secondary | ICD-10-CM | POA: Diagnosis not present

## 2015-12-17 DIAGNOSIS — O30009 Twin pregnancy, unspecified number of placenta and unspecified number of amniotic sacs, unspecified trimester: Secondary | ICD-10-CM

## 2015-12-17 DIAGNOSIS — M549 Dorsalgia, unspecified: Secondary | ICD-10-CM | POA: Diagnosis not present

## 2015-12-17 DIAGNOSIS — O30003 Twin pregnancy, unspecified number of placenta and unspecified number of amniotic sacs, third trimester: Secondary | ICD-10-CM

## 2015-12-17 DIAGNOSIS — O9989 Other specified diseases and conditions complicating pregnancy, childbirth and the puerperium: Secondary | ICD-10-CM

## 2015-12-17 LAB — URINALYSIS, ROUTINE W REFLEX MICROSCOPIC
BILIRUBIN URINE: NEGATIVE
Glucose, UA: NEGATIVE mg/dL
Hgb urine dipstick: NEGATIVE
KETONES UR: 15 mg/dL — AB
Nitrite: NEGATIVE
Protein, ur: NEGATIVE mg/dL
SPECIFIC GRAVITY, URINE: 1.01 (ref 1.005–1.030)
pH: 6.5 (ref 5.0–8.0)

## 2015-12-17 LAB — URINE MICROSCOPIC-ADD ON

## 2015-12-17 MED ORDER — PENICILLIN G POTASSIUM 5000000 UNITS IJ SOLR
5.0000 10*6.[IU] | Freq: Once | INTRAMUSCULAR | Status: AC
  Administered 2015-12-17: 5 10*6.[IU] via INTRAVENOUS
  Filled 2015-12-17: qty 5

## 2015-12-17 MED ORDER — OXYCODONE HCL 5 MG PO TABS
5.0000 mg | ORAL_TABLET | Freq: Once | ORAL | Status: AC
  Administered 2015-12-17: 5 mg via ORAL
  Filled 2015-12-17: qty 1

## 2015-12-17 MED ORDER — NIFEDIPINE 10 MG PO CAPS
30.0000 mg | ORAL_CAPSULE | Freq: Once | ORAL | Status: AC
  Administered 2015-12-17: 30 mg via ORAL
  Filled 2015-12-17: qty 3

## 2015-12-17 MED ORDER — BETAMETHASONE SOD PHOS & ACET 6 (3-3) MG/ML IJ SUSP
12.0000 mg | Freq: Once | INTRAMUSCULAR | Status: AC
  Administered 2015-12-17: 12 mg via INTRAMUSCULAR
  Filled 2015-12-17: qty 2

## 2015-12-17 MED ORDER — OXYCODONE-ACETAMINOPHEN 5-325 MG PO TABS
1.0000 | ORAL_TABLET | Freq: Once | ORAL | Status: AC
  Administered 2015-12-17: 1 via ORAL
  Filled 2015-12-17: qty 1

## 2015-12-17 NOTE — Progress Notes (Signed)
Unable to edit attested MAU note. Given gestational age less than 34 weeks will give nifedipine immediate release 30 mg po.

## 2015-12-17 NOTE — MAU Provider Note (Signed)
MAU HISTORY AND PHYSICAL  Chief Complaint:  Contractions   April [name] is a 25 y.o.  [id number] with IUP at 7175w6d presenting for Contractions  Going on for 3 days. Every 20 to 30 minutes. Moderately painful. Not lessening up. No leakage or fluid or bleeding. A few weeks ago says cervix was closed.    Past Medical History  Diagnosis Date  . Depression   . GERD (gastroesophageal reflux disease)   . UTI (urinary tract infection) in pregnancy in first trimester   . Herpes   . Infection     UTI    Past Surgical History  Procedure Laterality Date  . Dilation and curettage of uterus      Family History  Problem Relation Age of Onset  . Hypertension Maternal Grandmother   . Depression Maternal Grandmother   . Heart disease Maternal Grandmother   . Cancer Maternal Grandfather     lung    Social History  Substance Use Topics  . Smoking status: Current Every Day Smoker -- 0.25 packs/day for 3 years    Types: Cigarettes    Start date: 04/29/2007  . Smokeless tobacco: Never Used     Comment: Declines help  . Alcohol Use: No    Allergies  Allergen Reactions  . Codeine Nausea And Vomiting    Prescriptions prior to admission  Medication Sig Dispense Refill Last Dose  . acyclovir (ZOVIRAX) 200 MG capsule Take two tablets three times a day for 5 days for each outbreak. (Patient taking differently: Take 400 mg by mouth 3 (three) times daily as needed (outbreak). Take two tablets three times a day for 5 days for each outbreak.) 90 capsule 1 Past Month at Unknown time  . diphenhydrAMINE (SOMINEX) 25 MG tablet Take 25 mg by mouth as needed for sleep.   Past Week at Unknown time  . ondansetron (ZOFRAN) 4 MG tablet Take 1 tablet (4 mg total) by mouth every 8 (eight) hours as needed for nausea or vomiting. 20 tablet 0 Past Month at Unknown time  . pantoprazole (PROTONIX) 40 MG tablet Take 1 tablet (40 mg total) by mouth daily. 30 tablet 1 12/17/2015 at Unknown time  . Prenatal  Vit-Min-FA-Fish Oil ([employer] PRENATAL GUMMY) 0.4-113.5 MG CHEW Chew 2 each by mouth daily.   Past Month at Unknown time  . oxyCODONE-acetaminophen (ROXICET) 5-325 MG tablet Take 1 tablet by mouth every 6 (six) hours as needed for severe pain. (Patient not taking: Reported on 12/17/2015) 5 tablet 0 Taking  . ranitidine (ZANTAC) 150 MG tablet Take 1 tablet (150 mg total) by mouth 2 (two) times daily. (Patient not taking: Reported on 12/17/2015) 60 tablet 3 Taking  . zolpidem (AMBIEN) 5 MG tablet Take 1 tablet (5 mg total) by mouth at bedtime as needed for sleep. (Patient not taking: Reported on 12/17/2015) 5 tablet 0 Not Taking at Unknown time    Review of Systems - Negative except for what is mentioned in HPI.  Physical Exam  Blood pressure 122/70, pulse 108, temperature 98.1 F (36.7 C), temperature source Oral, resp. rate 18, weight 140 lb 9.6 oz (63.776 kg), last menstrual period 03/30/2015. GENERAL: Well-developed, well-nourished female in no acute distress.  LUNGS: Clear to auscultation bilaterally.  HEART: Regular rate and rhythm. ABDOMEN: Soft, nontender, nondistended, gravid.  EXTREMITIES: Nontender, no edema, 2+ distal pulses. Cervical Exam: 1.5/50/-3 Presentation: fetus A vertex FHT:  140/mod/+a/-d, 145/mod/+a/-d Contractions: q 2-3 min   Labs: Results for orders placed or performed during the hospital  encounter of 12/17/15 (from the past 24 hour(s))  Urinalysis, Routine w reflex microscopic (not at [facility])   Collection Time: 12/17/15  3:45 PM  Result Value Ref Range   Color, Urine YELLOW YELLOW   APPearance HAZY (A) CLEAR   Specific Gravity, Urine 1.010 1.005 - 1.030   pH 6.5 5.0 - 8.0   Glucose, UA NEGATIVE NEGATIVE mg/dL   Hgb urine dipstick NEGATIVE NEGATIVE   Bilirubin Urine NEGATIVE NEGATIVE   Ketones, ur 15 (A) NEGATIVE mg/dL   Protein, ur NEGATIVE NEGATIVE mg/dL   Nitrite NEGATIVE NEGATIVE   Leukocytes, UA TRACE (A) NEGATIVE  Urine microscopic-add on   Collection Time:  12/17/15  3:45 PM  Result Value Ref Range   Squamous Epithelial / LPF 6-30 (A) NONE SEEN   WBC, UA 0-5 0 - 5 WBC/hpf   RBC / HPF 0-5 0 - 5 RBC/hpf   Bacteria, UA FEW (A) NONE SEEN    Imaging Studies:  [place] Mfm Ob Follow Up  12/16/2015  OBSTETRICAL ULTRASOUND: This exam was performed within a [place] [facility]. The OB US report was generated in the AS system, and faxed to the ordering physician.  This report is available in the [employer]. See the AS Obstetric US report via the Image Link.  [place] Mfm Ob Follow Up Addl Gest  12/16/2015  OBSTETRICAL ULTRASOUND: This exam was performed within a [place] [facility] Department. The OB US report was generated in the AS system, and faxed to the ordering physician.  This report is available in the [employer]. See the AS Obstetric US report via the Image Link.   Assessment: April [name] is  25 y.o. Z6X0960 at [redacted]w[redacted]d presents with preterm labor. Contracting painfully, cervix changed from 1.5 cm to 2 cm after 2 hours here. NST reactive x2. Betamethasone given x1.  Plan: Transfer to [facility] as our NICU is full. Dr. Verita Lamb accepts transfer. Will start penicillin for gbs unknown.  Cherrie Gauze Wouk 3/20/20177:17 PM

## 2015-12-17 NOTE — MAU Note (Signed)
Been contracting last 3 days, they are now eery .  No bleeding or leaking.

## 2015-12-17 NOTE — Progress Notes (Signed)
Patient states she has had percocet before and it did not make her sick.  She states Tylenol with codeine has made her sick in the past, but wants the percocet.

## 2015-12-20 ENCOUNTER — Encounter: Payer: Medicaid Other | Admitting: Advanced Practice Midwife

## 2015-12-21 ENCOUNTER — Telehealth: Payer: Self-pay | Admitting: Obstetrics & Gynecology

## 2015-12-21 NOTE — Telephone Encounter (Signed)
Patient want to speak to a nurse about scheduling an [facility] appointment

## 2015-12-22 ENCOUNTER — Inpatient Hospital Stay (HOSPITAL_COMMUNITY)
Admission: AD | Admit: 2015-12-22 | Discharge: 2015-12-22 | Disposition: A | Discharge status: Home / Self Care | Payer: Medicaid Other | Source: Ambulatory Visit | Attending: Obstetrics & Gynecology | Admitting: Obstetrics & Gynecology

## 2015-12-22 ENCOUNTER — Encounter (HOSPITAL_COMMUNITY): Payer: Self-pay | Admitting: *Deleted

## 2015-12-22 DIAGNOSIS — O9989 Other specified diseases and conditions complicating pregnancy, childbirth and the puerperium: Secondary | ICD-10-CM

## 2015-12-22 DIAGNOSIS — Z3A34 34 weeks gestation of pregnancy: Secondary | ICD-10-CM | POA: Diagnosis not present

## 2015-12-22 DIAGNOSIS — O321XX2 Maternal care for breech presentation, fetus 2: Secondary | ICD-10-CM | POA: Diagnosis not present

## 2015-12-22 DIAGNOSIS — O30043 Twin pregnancy, dichorionic/diamniotic, third trimester: Secondary | ICD-10-CM | POA: Diagnosis not present

## 2015-12-22 DIAGNOSIS — F17210 Nicotine dependence, cigarettes, uncomplicated: Secondary | ICD-10-CM | POA: Diagnosis not present

## 2015-12-22 DIAGNOSIS — O30009 Twin pregnancy, unspecified number of placenta and unspecified number of amniotic sacs, unspecified trimester: Secondary | ICD-10-CM | POA: Diagnosis present

## 2015-12-22 DIAGNOSIS — O99613 Diseases of the digestive system complicating pregnancy, third trimester: Secondary | ICD-10-CM | POA: Insufficient documentation

## 2015-12-22 DIAGNOSIS — O09899 Supervision of other high risk pregnancies, unspecified trimester: Secondary | ICD-10-CM

## 2015-12-22 DIAGNOSIS — O479 False labor, unspecified: Secondary | ICD-10-CM

## 2015-12-22 DIAGNOSIS — Z6791 Unspecified blood type, Rh negative: Secondary | ICD-10-CM | POA: Diagnosis present

## 2015-12-22 DIAGNOSIS — M549 Dorsalgia, unspecified: Secondary | ICD-10-CM

## 2015-12-22 DIAGNOSIS — F17200 Nicotine dependence, unspecified, uncomplicated: Secondary | ICD-10-CM

## 2015-12-22 DIAGNOSIS — O99891 Other specified diseases and conditions complicating pregnancy: Secondary | ICD-10-CM

## 2015-12-22 DIAGNOSIS — O99333 Smoking (tobacco) complicating pregnancy, third trimester: Secondary | ICD-10-CM | POA: Diagnosis not present

## 2015-12-22 DIAGNOSIS — O4703 False labor before 37 completed weeks of gestation, third trimester: Secondary | ICD-10-CM | POA: Diagnosis not present

## 2015-12-22 DIAGNOSIS — Z283 Underimmunization status: Secondary | ICD-10-CM

## 2015-12-22 DIAGNOSIS — O30049 Twin pregnancy, dichorionic/diamniotic, unspecified trimester: Secondary | ICD-10-CM | POA: Diagnosis present

## 2015-12-22 DIAGNOSIS — K219 Gastro-esophageal reflux disease without esophagitis: Secondary | ICD-10-CM | POA: Diagnosis not present

## 2015-12-22 LAB — URINALYSIS, ROUTINE W REFLEX MICROSCOPIC
BILIRUBIN URINE: NEGATIVE
Glucose, UA: NEGATIVE mg/dL
HGB URINE DIPSTICK: NEGATIVE
Ketones, ur: 15 mg/dL — AB
NITRITE: NEGATIVE
PH: 6 (ref 5.0–8.0)
Protein, ur: NEGATIVE mg/dL
SPECIFIC GRAVITY, URINE: 1.01 (ref 1.005–1.030)

## 2015-12-22 LAB — URINE MICROSCOPIC-ADD ON

## 2015-12-22 NOTE — MAU Provider Note (Signed)
History     CSN: [id number] Arrival date and time: 12/22/15 1708 First Provider Initiated Contact with Patient 12/22/15 1831      Chief Complaint  Patient presents with  . Abdominal Cramping  . Leg Swelling  . Contractions   HPI Patient is 25 y.o. [id number] 8812w4d here with complaints of  Contractions. Contractions are every 20-30 minutes. She was [facility] for threatened PTL and was discharged 3/23. She reports back pain- that is the same as it has always been. Also new onset bilateral leg swelling.   +FM, denies LOF, VB, contractions, vaginal discharge.   OB History    Gravida Para Term Preterm AB TAB SAB Ectopic Multiple Living   4 1 1  0 2 1 1   1       Past Medical History  Diagnosis Date  . Depression   . GERD (gastroesophageal reflux disease)   . UTI (urinary tract infection) in pregnancy in first trimester   . Herpes   . Infection     UTI    Past Surgical History  Procedure Laterality Date  . Dilation and curettage of uterus      Family History  Problem Relation Age of Onset  . Hypertension Maternal Grandmother   . Depression Maternal Grandmother   . Heart disease Maternal Grandmother   . Cancer Maternal Grandfather     lung    Social History  Substance Use Topics  . Smoking status: Current Every Day Smoker -- 0.25 packs/day for 3 years    Types: Cigarettes    Start date: 04/29/2007  . Smokeless tobacco: Never Used     Comment: Declines help  . Alcohol Use: No    Allergies:  Allergies  Allergen Reactions  . Codeine Nausea And Vomiting    Prescriptions prior to admission  Medication Sig Dispense Refill Last Dose  . acyclovir (ZOVIRAX) 200 MG capsule Take 400 mg by mouth 3 (three) times daily as needed (for outbreaks).   Past Week at Unknown time  . pantoprazole (PROTONIX) 40 MG tablet Take 1 tablet (40 mg total) by mouth daily. 30 tablet 1 12/21/2015 at Unknown time  . Prenatal Vit-Min-FA-Fish Oil ([employer] PRENATAL GUMMY) 0.4-113.5 MG  CHEW Chew 2 each by mouth daily.   Past Month at Unknown time  . ondansetron (ZOFRAN) 4 MG tablet Take 1 tablet (4 mg total) by mouth every 8 (eight) hours as needed for nausea or vomiting. (Patient not taking: Reported on 12/22/2015) 20 tablet 0 Past Month at Unknown time  . oxyCODONE-acetaminophen (ROXICET) 5-325 MG tablet Take 1 tablet by mouth every 6 (six) hours as needed for severe pain. (Patient not taking: Reported on 12/17/2015) 5 tablet 0 Taking  . ranitidine (ZANTAC) 150 MG tablet Take 1 tablet (150 mg total) by mouth 2 (two) times daily. (Patient not taking: Reported on 12/17/2015) 60 tablet 3 Taking  . zolpidem (AMBIEN) 5 MG tablet Take 1 tablet (5 mg total) by mouth at bedtime as needed for sleep. (Patient not taking: Reported on 12/17/2015) 5 tablet 0 Not Taking at Unknown time    Review of Systems  Constitutional: Negative for fever and chills.  Eyes: Negative for blurred vision and double vision.  Respiratory: Negative for cough and shortness of breath.   Cardiovascular: Negative for chest pain and orthopnea.  Gastrointestinal: Negative for nausea and vomiting.  Genitourinary: Negative for dysuria, frequency and flank pain.  Musculoskeletal: Negative for myalgias.  Skin: Negative for rash.  Neurological: Negative for dizziness, tingling,  weakness and headaches.  Endo/Heme/Allergies: Does not bruise/bleed easily.  Psychiatric/Behavioral: Negative for depression and suicidal ideas. The patient is not nervous/anxious.    Physical Exam   Blood pressure 126/78, pulse 115, temperature 98.3 F (36.8 C), temperature source Oral, resp. rate 18, last menstrual period 03/30/2015.  Physical Exam  Nursing note and vitals reviewed. Constitutional: She is oriented to person, place, and time. She appears well-developed and well-nourished. No distress.  Pregnant female  HENT:  Head: Normocephalic and atraumatic.  Eyes: Conjunctivae are normal. No scleral icterus.  Neck: Normal range of  motion. Neck supple.  Cardiovascular: Normal rate and intact distal pulses.   Respiratory: Effort normal. She exhibits no tenderness.  GI: Soft. There is no tenderness. There is no rebound and no guarding.  Gravid  Genitourinary: Vagina normal.  Musculoskeletal: Normal range of motion. She exhibits edema (2+ to knees bilatearally).  Neurological: She is alert and oriented to person, place, and time.  Skin: Skin is warm and dry. No rash noted.  Psychiatric: She has a normal mood and affect.    Dilation: 2 Effacement (%): 50 Cervical Position: Posterior Station: -3 Exam by:: Dr. Alvester Morin  MAU Course  Procedures  MDM- not concerned for preterm labor currently given lack of cervical change from prior exams. Swelling in less likely normal variant but is high risk for Preeclampsia but BP is normal here today.   Results for orders placed or performed during the hospital encounter of 12/22/15 (from the past 48 hour(s))  Urinalysis, Routine w reflex microscopic (not at [facility])     Status: Abnormal   Collection Time: 12/22/15  5:18 PM  Result Value Ref Range   Color, Urine YELLOW YELLOW   APPearance CLEAR CLEAR   Specific Gravity, Urine 1.010 1.005 - 1.030   pH 6.0 5.0 - 8.0   Glucose, UA NEGATIVE NEGATIVE mg/dL   Hgb urine dipstick NEGATIVE NEGATIVE   Bilirubin Urine NEGATIVE NEGATIVE   Ketones, ur 15 (A) NEGATIVE mg/dL   Protein, ur NEGATIVE NEGATIVE mg/dL   Nitrite NEGATIVE NEGATIVE   Leukocytes, UA TRACE (A) NEGATIVE  Urine microscopic-add on     Status: Abnormal   Collection Time: 12/22/15  5:18 PM  Result Value Ref Range   Squamous Epithelial / LPF 0-5 (A) NONE SEEN   WBC, UA 6-30 0 - 5 WBC/hpf   RBC / HPF 0-5 0 - 5 RBC/hpf   Bacteria, UA MANY (A) NONE SEEN   NST Twin A 125/mod/+accels, no decels Twin B 145/mod/+accels, no decels Toco q7-15 min contractions  Reviewed last growth [place] Twin A- Vertex, 1958g 41st% Twin B- Breech, 2261g, 67th%   Assessment and Plan  1.  Braxton hicks 2. Peripheral edema without elevated BP 3. Mercie Eon pregnancy  Discharge home with labor precautions Follow up in clinic for BP check Needs to start 2x weekly NSTs at 35 weeks (likely at follow up visit) Message sent to [facility] to set up appointment ASAP   Federico Flake 12/22/2015, 6:55 PM

## 2015-12-22 NOTE — MAU Note (Signed)
Pt [facility] and was sent to [facility] because the NICU was full.  Pt was having contractions and was changing her cervix.  Pt states that at [facility] she was given medications to stop the contractions.  The pt states the medication made her heart rate increase to 160 then she declined any additional doses of the medication.  Pt states that while she was in the hospital [facility] her feet and legs began to swell.  Today pt complains of abdominal cramping and contractions.  Additionally pt complains of swelling in her feet and legs.

## 2015-12-22 NOTE — Discharge Instructions (Signed)
Come to the MAU (maternity admission unit) for 1) Strong contractions every 5 minutes for at least 1 hour that do not go away when you drink water or take a warm shower. These contractions will be so strong all you can do is breath through them 2) Vaginal bleeding- anything more than spotting 3) Loss of fluid like you broke your water 4) Decreased movement of your baby

## 2015-12-26 ENCOUNTER — Inpatient Hospital Stay (HOSPITAL_COMMUNITY)
Admission: AD | Admit: 2015-12-26 | Discharge: 2015-12-26 | Disposition: A | Discharge status: Home / Self Care | Payer: Medicaid Other | Source: Ambulatory Visit | Attending: Family Medicine | Admitting: Family Medicine

## 2015-12-26 ENCOUNTER — Encounter (HOSPITAL_COMMUNITY): Payer: Self-pay

## 2015-12-26 DIAGNOSIS — Z3A35 35 weeks gestation of pregnancy: Secondary | ICD-10-CM | POA: Insufficient documentation

## 2015-12-26 DIAGNOSIS — O4703 False labor before 37 completed weeks of gestation, third trimester: Secondary | ICD-10-CM | POA: Diagnosis not present

## 2015-12-26 DIAGNOSIS — F17210 Nicotine dependence, cigarettes, uncomplicated: Secondary | ICD-10-CM | POA: Diagnosis not present

## 2015-12-26 DIAGNOSIS — O99333 Smoking (tobacco) complicating pregnancy, third trimester: Secondary | ICD-10-CM | POA: Diagnosis not present

## 2015-12-26 DIAGNOSIS — O99613 Diseases of the digestive system complicating pregnancy, third trimester: Secondary | ICD-10-CM | POA: Insufficient documentation

## 2015-12-26 DIAGNOSIS — K219 Gastro-esophageal reflux disease without esophagitis: Secondary | ICD-10-CM | POA: Diagnosis not present

## 2015-12-26 DIAGNOSIS — O479 False labor, unspecified: Secondary | ICD-10-CM

## 2015-12-26 NOTE — Discharge Instructions (Signed)
Braxton Hicks Contractions °Contractions of the uterus can occur throughout pregnancy. Contractions are not always a sign that you are in labor.  °WHAT ARE BRAXTON HICKS CONTRACTIONS?  °Contractions that occur before labor are called Braxton Hicks contractions, or false labor. Toward the end of pregnancy (32-34 weeks), these contractions can develop more often and may become more forceful. This is not true labor because these contractions do not result in opening (dilatation) and thinning of the cervix. They are sometimes difficult to tell apart from true labor because these contractions can be forceful and people have different pain tolerances. You should not feel embarrassed if you go to the hospital with false labor. Sometimes, the only way to tell if you are in true labor is for your health care provider to look for changes in the cervix. °If there are no prenatal problems or other health problems associated with the pregnancy, it is completely safe to be sent home with false labor and await the onset of true labor. °HOW CAN YOU TELL THE DIFFERENCE BETWEEN TRUE AND FALSE LABOR? °False Labor °· The contractions of false labor are usually shorter and not as hard as those of true labor.   °· The contractions are usually irregular.   °· The contractions are often felt in the front of the lower abdomen and in the groin.   °· The contractions may go away when you walk around or change positions while lying down.   °· The contractions get weaker and are shorter lasting as time goes on.   °· The contractions do not usually become progressively stronger, regular, and closer together as with true labor.   °True Labor °· Contractions in true labor last 30-70 seconds, become very regular, usually become more intense, and increase in frequency.   °· The contractions do not go away with walking.   °· The discomfort is usually felt in the top of the uterus and spreads to the lower abdomen and low back.   °· True labor can be  determined by your health care provider with an exam. This will show that the cervix is dilating and getting thinner.   °WHAT TO REMEMBER °· Keep up with your usual exercises and follow other instructions given by your health care provider.   °· Take medicines as directed by your health care provider.   °· Keep your regular prenatal appointments.   °· Eat and drink lightly if you think you are going into labor.   °· If Braxton Hicks contractions are making you uncomfortable:   °¨ Change your position from lying down or resting to walking, or from walking to resting.   °¨ Sit and rest in a tub of warm water.   °¨ Drink 2-3 glasses of water. Dehydration may cause these contractions.   °¨ Do slow and deep breathing several times an hour.   °WHEN SHOULD I SEEK IMMEDIATE MEDICAL CARE? °Seek immediate medical care if: °· Your contractions become stronger, more regular, and closer together.   °· You have fluid leaking or gushing from your vagina.   °· You have a fever.   °· You pass blood-tinged mucus.   °· You have vaginal bleeding.   °· You have continuous abdominal pain.   °· You have low back pain that you never had before.   °· You feel your baby's head pushing down and causing pelvic pressure.   °· Your baby is not moving as much as it used to.   °  °This information is not intended to replace advice given to you by your health care provider. Make sure you discuss any questions you have with your health care   provider. °  °Document Released: 09/15/2005 Document Revised: 09/20/2013 Document Reviewed: 06/27/2013 °Elsevier Interactive Patient Education ©2016 [employer] ° °

## 2015-12-26 NOTE — Telephone Encounter (Signed)
Called Fleet ContrasRachel and left a message I was returning her call. Call us back if needs to today or can wait until next ob appt tomorrow 12/27/15. Per chart review did see she has been to mAU  Twice since she called clinic.

## 2015-12-26 NOTE — MAU Note (Addendum)
Patient presents via EMS with c/o ctxs since last night 5-10 mins apart. Patient denies any bleeding or LOF

## 2015-12-26 NOTE — MAU Provider Note (Signed)
MAU HISTORY AND PHYSICAL  Chief Complaint:  Contractions   April [name] is a 25 y.o.  [id number] with IUP at [redacted]w[redacted]d presenting for Contractions  Multiple recent visits for such. First on 3/20, with cervical change from 1.5 cm to 2 cm, transferred to [facility], hospitalized total of 3-4 days. Received bmz x2. Again here in [facility] on 3/25, cervix unchan ged from prior.  Now with a couple days painful contractions every 5-10 minutes. No leakage of fluid, no bleeding. No nausea/vomiting.    Past Medical History  Diagnosis Date  . Depression   . GERD (gastroesophageal reflux disease)   . UTI (urinary tract infection) in pregnancy in first trimester   . Herpes   . Infection     UTI    Past Surgical History  Procedure Laterality Date  . Dilation and curettage of uterus      Family History  Problem Relation Age of Onset  . Hypertension Maternal Grandmother   . Depression Maternal Grandmother   . Heart disease Maternal Grandmother   . Cancer Maternal Grandfather     lung    Social History  Substance Use Topics  . Smoking status: Current Every Day Smoker -- 0.25 packs/day for 3 years    Types: Cigarettes    Start date: 04/29/2007  . Smokeless tobacco: Never Used     Comment: Declines help  . Alcohol Use: No    Allergies  Allergen Reactions  . Codeine Nausea And Vomiting    Prescriptions prior to admission  Medication Sig Dispense Refill Last Dose  . acyclovir (ZOVIRAX) 200 MG capsule Take 400 mg by mouth 3 (three) times daily as needed (for outbreaks).   Past Week at Unknown time  . pantoprazole (PROTONIX) 40 MG tablet Take 1 tablet (40 mg total) by mouth daily. 30 tablet 1 12/21/2015 at Unknown time  . Prenatal Vit-Min-FA-Fish Oil ([employer] PRENATAL GUMMY) 0.4-113.5 MG CHEW Chew 2 each by mouth daily.   Past Month at Unknown time  . zolpidem (AMBIEN) 5 MG tablet Take 1 tablet (5 mg total) by mouth at bedtime as needed for sleep. (Patient not taking: Reported on 12/17/2015) 5 tablet  0 Not Taking at Unknown time    Review of Systems - Negative except for what is mentioned in HPI.  Physical Exam  Blood pressure 124/73, pulse 105, temperature 97.8 F (36.6 C), temperature source Oral, resp. rate 18, last menstrual period 03/30/2015. GENERAL: Well-developed, well-nourished female in no acute distress.  LUNGS: Clear to auscultation bilaterally.  HEART: Regular rate and rhythm. ABDOMEN: Soft, nontender, nondistended, gravid.  EXTREMITIES: Nontender, no edema, 2+ distal pulses. Cervical Exam: 2/60/-3 Presentation: cephalic FHT:  140/mod/+a/-d, 147/WGN/+F/-A Contractions: Every 5-7 mins   Labs: No results found for this or any previous visit (from the past 24 hour(s)).  Imaging Studies:  [place] Mfm Ob Follow Up  12/16/2015  OBSTETRICAL ULTRASOUND: This exam was performed within a [facility]. The OB US report was generated in the AS system, and faxed to the ordering physician.  This report is available in the [employer]. See the AS Obstetric US report via the Image Link.  [place] Mfm Ob Follow Up Addl Gest  12/16/2015  OBSTETRICAL ULTRASOUND: This exam was performed within a [facility]. The OB US report was generated in the AS system, and faxed to the ordering physician.  This report is available in the [employer]. See the AS Obstetric US report via the Image Link.   Assessment:  April [name] is  25 y.o. [id number] at 4176w1d presents with contractions. False labor as cervix unchanged from exam 4 days ago, and has been contracting with current frequency/severity for over 24 hours. No signs/symptoms pprom or abruption. Is s/p bmz x2. NST reactive x2 (di-di twins).  Plan: - labor, abruption, and pprom return precautions - declines procardia. Discussed rest, warm bath, tylenol, pushing fluids - ob f/u tomorrow as scheduled (with me)  Silvano BilisNoah B Jeanann Balinski 3/29/20171:21 AM

## 2015-12-27 ENCOUNTER — Encounter: Payer: Medicaid Other | Admitting: Obstetrics and Gynecology

## 2015-12-30 ENCOUNTER — Inpatient Hospital Stay (HOSPITAL_COMMUNITY): Payer: Medicaid Other

## 2015-12-30 ENCOUNTER — Encounter (HOSPITAL_COMMUNITY): Payer: Self-pay | Admitting: *Deleted

## 2015-12-30 ENCOUNTER — Inpatient Hospital Stay (HOSPITAL_COMMUNITY)
Admission: AD | Admit: 2015-12-30 | Discharge: 2016-01-03 | DRG: 765 | Disposition: A | Discharge status: Home / Self Care | Payer: Medicaid Other | Source: Ambulatory Visit | Attending: Obstetrics and Gynecology | Admitting: Obstetrics and Gynecology

## 2015-12-30 DIAGNOSIS — R58 Hemorrhage, not elsewhere classified: Secondary | ICD-10-CM

## 2015-12-30 DIAGNOSIS — O9989 Other specified diseases and conditions complicating pregnancy, childbirth and the puerperium: Secondary | ICD-10-CM

## 2015-12-30 DIAGNOSIS — Z2839 Other underimmunization status: Secondary | ICD-10-CM

## 2015-12-30 DIAGNOSIS — B009 Herpesviral infection, unspecified: Secondary | ICD-10-CM | POA: Diagnosis present

## 2015-12-30 DIAGNOSIS — O99613 Diseases of the digestive system complicating pregnancy, third trimester: Secondary | ICD-10-CM | POA: Diagnosis present

## 2015-12-30 DIAGNOSIS — Z98891 History of uterine scar from previous surgery: Secondary | ICD-10-CM

## 2015-12-30 DIAGNOSIS — F17200 Nicotine dependence, unspecified, uncomplicated: Secondary | ICD-10-CM | POA: Diagnosis present

## 2015-12-30 DIAGNOSIS — F32A Depression, unspecified: Secondary | ICD-10-CM | POA: Diagnosis present

## 2015-12-30 DIAGNOSIS — Z6791 Unspecified blood type, Rh negative: Secondary | ICD-10-CM | POA: Diagnosis present

## 2015-12-30 DIAGNOSIS — O30049 Twin pregnancy, dichorionic/diamniotic, unspecified trimester: Secondary | ICD-10-CM | POA: Diagnosis present

## 2015-12-30 DIAGNOSIS — Z283 Underimmunization status: Secondary | ICD-10-CM

## 2015-12-30 DIAGNOSIS — O9902 Anemia complicating childbirth: Secondary | ICD-10-CM | POA: Diagnosis present

## 2015-12-30 DIAGNOSIS — O4290 Premature rupture of membranes, unspecified as to length of time between rupture and onset of labor, unspecified weeks of gestation: Secondary | ICD-10-CM

## 2015-12-30 DIAGNOSIS — Z3A35 35 weeks gestation of pregnancy: Secondary | ICD-10-CM

## 2015-12-30 DIAGNOSIS — Z3490 Encounter for supervision of normal pregnancy, unspecified, unspecified trimester: Secondary | ICD-10-CM

## 2015-12-30 DIAGNOSIS — O9832 Other infections with a predominantly sexual mode of transmission complicating childbirth: Secondary | ICD-10-CM | POA: Diagnosis present

## 2015-12-30 DIAGNOSIS — O99333 Smoking (tobacco) complicating pregnancy, third trimester: Secondary | ICD-10-CM | POA: Diagnosis present

## 2015-12-30 DIAGNOSIS — O26893 Other specified pregnancy related conditions, third trimester: Secondary | ICD-10-CM | POA: Diagnosis present

## 2015-12-30 DIAGNOSIS — O42913 Preterm premature rupture of membranes, unspecified as to length of time between rupture and onset of labor, third trimester: Secondary | ICD-10-CM | POA: Diagnosis present

## 2015-12-30 DIAGNOSIS — F329 Major depressive disorder, single episode, unspecified: Secondary | ICD-10-CM | POA: Diagnosis present

## 2015-12-30 DIAGNOSIS — O30043 Twin pregnancy, dichorionic/diamniotic, third trimester: Secondary | ICD-10-CM | POA: Diagnosis present

## 2015-12-30 DIAGNOSIS — A6000 Herpesviral infection of urogenital system, unspecified: Secondary | ICD-10-CM | POA: Diagnosis present

## 2015-12-30 DIAGNOSIS — O30009 Twin pregnancy, unspecified number of placenta and unspecified number of amniotic sacs, unspecified trimester: Secondary | ICD-10-CM | POA: Diagnosis present

## 2015-12-30 DIAGNOSIS — O321XX2 Maternal care for breech presentation, fetus 2: Principal | ICD-10-CM | POA: Diagnosis present

## 2015-12-30 DIAGNOSIS — Z6741 Type O blood, Rh negative: Secondary | ICD-10-CM

## 2015-12-30 LAB — AMNISURE RUPTURE OF MEMBRANE (ROM) NOT AT ARMC: Amnisure ROM: POSITIVE

## 2015-12-30 LAB — CBC
HEMATOCRIT: 25.6 % — AB (ref 36.0–46.0)
HEMOGLOBIN: 7.6 g/dL — AB (ref 12.0–15.0)
MCH: 22.1 pg — ABNORMAL LOW (ref 26.0–34.0)
MCHC: 29.7 g/dL — AB (ref 30.0–36.0)
MCV: 74.4 fL — AB (ref 78.0–100.0)
Platelets: 227 10*3/uL (ref 150–400)
RBC: 3.44 MIL/uL — ABNORMAL LOW (ref 3.87–5.11)
RDW: 17.7 % — AB (ref 11.5–15.5)
WBC: 10 10*3/uL (ref 4.0–10.5)

## 2015-12-30 MED ORDER — TERBUTALINE SULFATE 1 MG/ML IJ SOLN
INTRAMUSCULAR | Status: AC
Start: 2015-12-30 — End: 2015-12-31
  Filled 2015-12-30: qty 1

## 2015-12-30 MED ORDER — SODIUM CHLORIDE 0.9 % IV SOLN
2.0000 g | Freq: Once | INTRAVENOUS | Status: AC
  Administered 2015-12-30: 2 g via INTRAVENOUS
  Filled 2015-12-30: qty 2000

## 2015-12-30 MED ORDER — CITRIC ACID-SODIUM CITRATE 334-500 MG/5ML PO SOLN
30.0000 mL | Freq: Once | ORAL | Status: AC
  Administered 2015-12-30: 30 mL via ORAL
  Filled 2015-12-30: qty 15

## 2015-12-30 MED ORDER — SODIUM CHLORIDE 0.9 % IV SOLN
1.0000 g | INTRAVENOUS | Status: DC
  Filled 2015-12-30: qty 1000

## 2015-12-30 MED ORDER — CEFAZOLIN SODIUM-DEXTROSE 2-4 GM/100ML-% IV SOLN
2.0000 g | Freq: Once | INTRAVENOUS | Status: AC
  Administered 2015-12-30: 2 g via INTRAVENOUS
  Filled 2015-12-30: qty 100

## 2015-12-30 MED ORDER — LACTATED RINGERS IV SOLN
INTRAVENOUS | Status: DC
  Administered 2015-12-30: 20:00:00 via INTRAVENOUS

## 2015-12-30 MED ORDER — TERBUTALINE SULFATE 1 MG/ML IJ SOLN
0.2500 mg | Freq: Once | INTRAMUSCULAR | Status: AC
  Administered 2015-12-30: 0.25 mg via SUBCUTANEOUS

## 2015-12-30 MED ORDER — FAMOTIDINE IN NACL 20-0.9 MG/50ML-% IV SOLN
20.0000 mg | Freq: Once | INTRAVENOUS | Status: AC
  Administered 2015-12-30: 20 mg via INTRAVENOUS
  Filled 2015-12-30: qty 50

## 2015-12-30 NOTE — Anesthesia Preprocedure Evaluation (Addendum)
Anesthesia Evaluation  Patient identified by MRN, date of birth, ID band Patient awake    Reviewed: Allergy & Precautions, NPO status , Patient's Chart, lab work & pertinent test results  Airway Mallampati: II  TM Distance: >3 FB Neck ROM: Full    Dental no notable dental hx.    Pulmonary Current Smoker,    Pulmonary exam normal breath sounds clear to auscultation       Cardiovascular negative cardio ROS Normal cardiovascular exam Rhythm:Regular Rate:Normal     Neuro/Psych negative neurological ROS  negative psych ROS   GI/Hepatic negative GI ROS, Neg liver ROS,   Endo/Other  negative endocrine ROS  Renal/GU negative Renal ROS  negative genitourinary   Musculoskeletal negative musculoskeletal ROS (+)   Abdominal   Peds negative pediatric ROS (+)  Hematology negative hematology ROS (+)   Anesthesia Other Findings   Reproductive/Obstetrics negative OB ROS                            Anesthesia Physical Anesthesia Plan  ASA: II  Anesthesia Plan: Spinal   Post-op Pain Management:    Induction:   Airway Management Planned: Natural Airway  Additional Equipment:   Intra-op Plan:   Post-operative Plan:   Informed Consent: I have reviewed the patients History and Physical, chart, labs and discussed the procedure including the risks, benefits and alternatives for the proposed anesthesia with the patient or authorized representative who has indicated his/her understanding and acceptance.   Dental advisory given  Plan Discussed with: CRNA  Anesthesia Plan Comments:         Anesthesia Quick Evaluation

## 2015-12-30 NOTE — H&P (Signed)
April [name] is a 25 y.o. female [id number] @ 35.5 wks twin gestation presenting for SROM at 1700. Gross ROM noted on admit. Maternal Medical History:  Reason for admission: Rupture of membranes.   Contractions: Onset was 3-5 hours ago.   Frequency: irregular.   Perceived severity is mild.    Fetal activity: Perceived fetal activity is normal.   Last perceived fetal movement was within the past hour.    Prenatal complications: twins  Prenatal Complications - Diabetes: none.    OB History    Gravida Para Term Preterm AB TAB SAB Ectopic Multiple Living   4 1 1  0 2 1 1   1      Past Medical History  Diagnosis Date  . Depression   . GERD (gastroesophageal reflux disease)   . UTI (urinary tract infection) in pregnancy in first trimester   . Herpes   . Infection     UTI   Past Surgical History  Procedure Laterality Date  . Dilation and curettage of uterus     Family History: family history includes Cancer in her maternal grandfather; Depression in her maternal grandmother; Heart disease in her maternal grandmother; Hypertension in her maternal grandmother. Social History:  reports that she has been smoking Cigarettes.  She started smoking about 8 years ago. She has a .75 pack-year smoking history. She has never used smokeless tobacco. She reports that she does not drink alcohol or use illicit drugs.   Prenatal Transfer Tool  Maternal Diabetes: No Genetic Screening: Normal Maternal Ultrasounds/Referrals: Normal Fetal Ultrasounds or other Referrals:  None Maternal Substance Abuse:  No Significant Maternal Medications:  None Significant Maternal Lab Results:  None Other Comments:  None  Review of Systems  Constitutional: Negative.   HENT: Negative.   Eyes: Negative.   Respiratory: Negative.   Cardiovascular: Negative.   Gastrointestinal: Negative.   Genitourinary: Negative.   Musculoskeletal: Negative.   Skin: Negative.   Neurological: Negative.    Endo/Heme/Allergies: Negative.   Psychiatric/Behavioral: Negative.       Last menstrual period 03/30/2015. Maternal Exam:  Uterine Assessment: Contraction strength is mild.  Contraction frequency is irregular.   Abdomen: Patient reports no abdominal tenderness. Fetal presentation: vertex  Introitus: Normal vulva. Normal vagina.  Amniotic fluid character: clear.  Pelvis: adequate for delivery.   Cervix: Cervix evaluated by digital exam.     Fetal Exam Fetal Monitor Review: Mode: ultrasound.   Variability: moderate (6-25 bpm).    Fetal State Assessment: Category I - tracings are normal.     Physical Exam  Constitutional: She is oriented to person, place, and time. She appears well-developed and well-nourished.  HENT:  Head: Normocephalic.  Eyes: Pupils are equal, round, and reactive to light.  Neck: Normal range of motion.  Cardiovascular: Normal rate, regular rhythm, normal heart sounds and intact distal pulses.   Respiratory: Effort normal and breath sounds normal.  GI: Soft. Bowel sounds are normal.  Genitourinary: Vagina normal and uterus normal.  Musculoskeletal: Normal range of motion.  Neurological: She is alert and oriented to person, place, and time. She has normal reflexes.  Skin: Skin is warm and dry.  Psychiatric: She has a normal mood and affect. Her behavior is normal. Judgment and thought content normal.    Prenatal labs: ABO, Rh: --/--/O NEG (03/01 1720) Antibody: NEG (03/01 1720) Rubella: 0.49 (09/29 1018) RPR: NON REAC (03/09 1134)  HBsAg: NEGATIVE (09/29 1018)  HIV: NONREACTIVE (03/09 1134)  GBS:     Assessment/Plan: SROM at 35.5  wks, twin gestation, Gross ROM cl fluid noted. SVE 2/90/-1 vertex twin A. Consult Dr. Jolayne Panther. Pt to be transferred to [facility] due to no NICU beds available.   April [name] 12/30/2015, 7:26 PM

## 2015-12-30 NOTE — Progress Notes (Signed)
Bedside u/s

## 2015-12-30 NOTE — MAU Note (Signed)
Water broke around 5pm.  Started having pains right after in belly and upper back that are constant

## 2015-12-31 ENCOUNTER — Encounter (HOSPITAL_COMMUNITY)
Admission: AD | Disposition: A | Discharge status: Home / Self Care | Payer: Self-pay | Source: Ambulatory Visit | Attending: Obstetrics and Gynecology

## 2015-12-31 ENCOUNTER — Inpatient Hospital Stay (HOSPITAL_COMMUNITY): Payer: Medicaid Other | Admitting: Anesthesiology

## 2015-12-31 ENCOUNTER — Encounter: Payer: Medicaid Other | Admitting: Obstetrics & Gynecology

## 2015-12-31 DIAGNOSIS — O99333 Smoking (tobacco) complicating pregnancy, third trimester: Secondary | ICD-10-CM | POA: Diagnosis present

## 2015-12-31 DIAGNOSIS — Z6741 Type O blood, Rh negative: Secondary | ICD-10-CM | POA: Diagnosis not present

## 2015-12-31 DIAGNOSIS — O9902 Anemia complicating childbirth: Secondary | ICD-10-CM | POA: Diagnosis present

## 2015-12-31 DIAGNOSIS — O9832 Other infections with a predominantly sexual mode of transmission complicating childbirth: Secondary | ICD-10-CM | POA: Diagnosis present

## 2015-12-31 DIAGNOSIS — O42013 Preterm premature rupture of membranes, onset of labor within 24 hours of rupture, third trimester: Secondary | ICD-10-CM | POA: Diagnosis not present

## 2015-12-31 DIAGNOSIS — B009 Herpesviral infection, unspecified: Secondary | ICD-10-CM | POA: Diagnosis present

## 2015-12-31 DIAGNOSIS — O321XX1 Maternal care for breech presentation, fetus 1: Secondary | ICD-10-CM | POA: Diagnosis not present

## 2015-12-31 DIAGNOSIS — O42913 Preterm premature rupture of membranes, unspecified as to length of time between rupture and onset of labor, third trimester: Secondary | ICD-10-CM | POA: Diagnosis present

## 2015-12-31 DIAGNOSIS — Z3A35 35 weeks gestation of pregnancy: Secondary | ICD-10-CM

## 2015-12-31 DIAGNOSIS — O30043 Twin pregnancy, dichorionic/diamniotic, third trimester: Secondary | ICD-10-CM | POA: Diagnosis not present

## 2015-12-31 DIAGNOSIS — Z98891 History of uterine scar from previous surgery: Secondary | ICD-10-CM

## 2015-12-31 DIAGNOSIS — O99613 Diseases of the digestive system complicating pregnancy, third trimester: Secondary | ICD-10-CM | POA: Diagnosis present

## 2015-12-31 DIAGNOSIS — O321XX2 Maternal care for breech presentation, fetus 2: Secondary | ICD-10-CM | POA: Diagnosis not present

## 2015-12-31 DIAGNOSIS — O26893 Other specified pregnancy related conditions, third trimester: Secondary | ICD-10-CM | POA: Diagnosis present

## 2015-12-31 LAB — COMPREHENSIVE METABOLIC PANEL
ALBUMIN: 1.9 g/dL — AB (ref 3.5–5.0)
ALK PHOS: 166 U/L — AB (ref 38–126)
ALT: 24 U/L (ref 14–54)
AST: 48 U/L — AB (ref 15–41)
Anion gap: 9 (ref 5–15)
BUN: 6 mg/dL (ref 6–20)
CALCIUM: 8.2 mg/dL — AB (ref 8.9–10.3)
CHLORIDE: 101 mmol/L (ref 101–111)
CO2: 20 mmol/L — AB (ref 22–32)
CREATININE: 0.56 mg/dL (ref 0.44–1.00)
GFR calc Af Amer: >60 mL/min (ref >60)
GFR calc non Af Amer: >60 mL/min (ref >60)
GLUCOSE: 92 mg/dL (ref 65–99)
Potassium: 4.2 mmol/L (ref 3.5–5.1)
SODIUM: 130 mmol/L — AB (ref 135–145)
Total Bilirubin: 0.7 mg/dL (ref 0.3–1.2)
Total Protein: 4.8 g/dL — ABNORMAL LOW (ref 6.5–8.1)

## 2015-12-31 LAB — CBC
HCT: 18.2 % — ABNORMAL LOW (ref 36.0–46.0)
HCT: 25.2 % — ABNORMAL LOW (ref 36.0–46.0)
HEMATOCRIT: 20 % — AB (ref 36.0–46.0)
HEMOGLOBIN: 6.1 g/dL — AB (ref 12.0–15.0)
HEMOGLOBIN: 8.5 g/dL — AB (ref 12.0–15.0)
Hemoglobin: 5.7 g/dL — CL (ref 12.0–15.0)
MCH: 22.7 pg — ABNORMAL LOW (ref 26.0–34.0)
MCH: 23.2 pg — AB (ref 26.0–34.0)
MCH: 26.2 pg (ref 26.0–34.0)
MCHC: 30.5 g/dL (ref 30.0–36.0)
MCHC: 31.3 g/dL (ref 30.0–36.0)
MCHC: 33.7 g/dL (ref 30.0–36.0)
MCV: 74.3 fL — ABNORMAL LOW (ref 78.0–100.0)
MCV: 74 fL — AB (ref 78.0–100.0)
MCV: 77.8 fL — ABNORMAL LOW (ref 78.0–100.0)
PLATELETS: 252 10*3/uL (ref 150–400)
Platelets: 267 10*3/uL (ref 150–400)
Platelets: 160 10*3/uL (ref 150–400)
RBC: 2.69 MIL/uL — AB (ref 3.87–5.11)
RBC: 2.46 MIL/uL — ABNORMAL LOW (ref 3.87–5.11)
RBC: 3.24 MIL/uL — AB (ref 3.87–5.11)
RDW: 17.9 % — ABNORMAL HIGH (ref 11.5–15.5)
RDW: 18.6 % — ABNORMAL HIGH (ref 11.5–15.5)
RDW: 17.5 % — ABNORMAL HIGH (ref 11.5–15.5)
WBC: 18 10*3/uL — AB (ref 4.0–10.5)
WBC: 21.2 10*3/uL — ABNORMAL HIGH (ref 4.0–10.5)
WBC: 19.6 10*3/uL — AB (ref 4.0–10.5)

## 2015-12-31 LAB — URINALYSIS W MICROSCOPIC (NOT AT ARMC)
Glucose, UA: NEGATIVE mg/dL
KETONES UR: NEGATIVE mg/dL
Leukocytes, UA: NEGATIVE
Nitrite: NEGATIVE
PROTEIN: 30 mg/dL — AB
Specific Gravity, Urine: >1.03 — ABNORMAL HIGH (ref 1.005–1.030)
pH: 5 (ref 5.0–8.0)

## 2015-12-31 LAB — PREPARE RBC (CROSSMATCH)

## 2015-12-31 SURGERY — Surgical Case
Anesthesia: Spinal

## 2015-12-31 MED ORDER — SODIUM CHLORIDE 0.9 % IV SOLN: Freq: Once | INTRAVENOUS | Status: DC

## 2015-12-31 MED ORDER — LACTATED RINGERS IV SOLN: INTRAVENOUS | Status: DC

## 2015-12-31 MED ORDER — DIPHENHYDRAMINE HCL 25 MG PO CAPS
25.0000 mg | ORAL_CAPSULE | Freq: Four times a day (QID) | ORAL | Status: DC | PRN
  Filled 2015-12-31: qty 1

## 2015-12-31 MED ORDER — ONDANSETRON HCL 4 MG/2ML IJ SOLN: 4.0000 mg | Freq: Once | INTRAMUSCULAR | Status: DC

## 2015-12-31 MED ORDER — MEPERIDINE HCL 25 MG/ML IJ SOLN
INTRAMUSCULAR | Status: DC | PRN
  Administered 2015-12-31 (×2): 12.5 mg via INTRAVENOUS

## 2015-12-31 MED ORDER — LACTATED RINGERS IV BOLUS (SEPSIS): 500.0000 mL | Freq: Once | INTRAVENOUS | Status: DC

## 2015-12-31 MED ORDER — SCOPOLAMINE 1 MG/3DAYS TD PT72
MEDICATED_PATCH | TRANSDERMAL | Status: DC | PRN
  Administered 2015-12-31: 1 via TRANSDERMAL

## 2015-12-31 MED ORDER — NALBUPHINE HCL 10 MG/ML IJ SOLN: 5.0000 mg | Freq: Once | INTRAMUSCULAR | Status: DC | PRN

## 2015-12-31 MED ORDER — ACETAMINOPHEN 325 MG PO TABS
650.0000 mg | ORAL_TABLET | ORAL | Status: DC | PRN
  Administered 2015-12-31 – 2016-01-03 (×5): 650 mg via ORAL
  Filled 2015-12-31 (×7): qty 2

## 2015-12-31 MED ORDER — OXYCODONE HCL 5 MG PO TABS
5.0000 mg | ORAL_TABLET | ORAL | Status: DC | PRN
Start: 2015-12-31 — End: 2016-01-04
  Administered 2015-12-31: 5 mg via ORAL

## 2015-12-31 MED ORDER — DIPHENHYDRAMINE HCL 25 MG PO CAPS
25.0000 mg | ORAL_CAPSULE | Freq: Once | ORAL | Status: AC
  Administered 2015-12-31: 25 mg via ORAL

## 2015-12-31 MED ORDER — HYDROMORPHONE HCL 1 MG/ML IJ SOLN
0.5000 mg | INTRAMUSCULAR | Status: DC | PRN
  Administered 2015-12-31 (×3): 0.5 mg via INTRAVENOUS
  Filled 2015-12-31 (×3): qty 1

## 2015-12-31 MED ORDER — BUPIVACAINE IN DEXTROSE 0.75-8.25 % IT SOLN
INTRATHECAL | Status: DC | PRN
  Administered 2015-12-31: 1.4 mg via INTRATHECAL

## 2015-12-31 MED ORDER — LACTATED RINGERS IV SOLN
INTRAVENOUS | Status: DC
  Administered 2015-12-31 (×2): via INTRAVENOUS

## 2015-12-31 MED ORDER — ACETAMINOPHEN 325 MG PO TABS
650.0000 mg | ORAL_TABLET | Freq: Once | ORAL | Status: AC
  Administered 2015-12-31: 650 mg via ORAL

## 2015-12-31 MED ORDER — SENNOSIDES-DOCUSATE SODIUM 8.6-50 MG PO TABS
2.0000 | ORAL_TABLET | ORAL | Status: DC
  Administered 2015-12-31 – 2016-01-01 (×2): 2 via ORAL
  Filled 2015-12-31 (×2): qty 2

## 2015-12-31 MED ORDER — OXYTOCIN 10 UNIT/ML IJ SOLN
40.0000 [IU] | INTRAVENOUS | Status: DC | PRN
  Administered 2015-12-31: 40 [IU] via INTRAVENOUS

## 2015-12-31 MED ORDER — WITCH HAZEL-GLYCERIN EX PADS: 1.0000 "application " | MEDICATED_PAD | CUTANEOUS | Status: DC | PRN

## 2015-12-31 MED ORDER — HYDROMORPHONE HCL 1 MG/ML IJ SOLN
1.0000 mg | INTRAMUSCULAR | Status: DC | PRN
  Administered 2015-12-31 – 2016-01-02 (×4): 1 mg via INTRAVENOUS
  Filled 2015-12-31 (×4): qty 1

## 2015-12-31 MED ORDER — KETOROLAC TROMETHAMINE 30 MG/ML IJ SOLN
30.0000 mg | Freq: Four times a day (QID) | INTRAMUSCULAR | Status: AC | PRN
  Administered 2015-12-31: 30 mg via INTRAMUSCULAR

## 2015-12-31 MED ORDER — METOCLOPRAMIDE HCL 5 MG/ML IJ SOLN
INTRAMUSCULAR | Status: AC
  Filled 2015-12-31: qty 2

## 2015-12-31 MED ORDER — CEFAZOLIN SODIUM-DEXTROSE 2-4 GM/100ML-% IV SOLN
2.0000 g | INTRAVENOUS | Status: AC
  Administered 2015-12-31: 2 g via INTRAVENOUS

## 2015-12-31 MED ORDER — DEXTROSE 5 % IV SOLN
1.0000 ug/kg/h | INTRAVENOUS | Status: DC | PRN
  Filled 2015-12-31: qty 2

## 2015-12-31 MED ORDER — DIPHENHYDRAMINE HCL 25 MG PO CAPS: 25.0000 mg | ORAL_CAPSULE | ORAL | Status: DC | PRN

## 2015-12-31 MED ORDER — KETOROLAC TROMETHAMINE 30 MG/ML IJ SOLN
INTRAMUSCULAR | Status: AC
  Administered 2015-12-31: 30 mg via INTRAMUSCULAR
  Filled 2015-12-31: qty 1

## 2015-12-31 MED ORDER — KETOROLAC TROMETHAMINE 30 MG/ML IJ SOLN
30.0000 mg | Freq: Four times a day (QID) | INTRAMUSCULAR | Status: AC | PRN
  Administered 2015-12-31: 30 mg via INTRAVENOUS
  Filled 2015-12-31: qty 1

## 2015-12-31 MED ORDER — MEPERIDINE HCL 25 MG/ML IJ SOLN
6.2500 mg | INTRAMUSCULAR | Status: DC | PRN
Start: 2015-12-31 — End: 2015-12-31

## 2015-12-31 MED ORDER — FENTANYL CITRATE (PF) 100 MCG/2ML IJ SOLN
INTRAMUSCULAR | Status: DC | PRN
  Administered 2015-12-31: 12.5 ug via INTRATHECAL
  Administered 2015-12-31: 87.5 ug via INTRAVENOUS

## 2015-12-31 MED ORDER — NALBUPHINE HCL 10 MG/ML IJ SOLN: 5.0000 mg | INTRAMUSCULAR | Status: DC | PRN

## 2015-12-31 MED ORDER — SIMETHICONE 80 MG PO CHEW
80.0000 mg | CHEWABLE_TABLET | ORAL | Status: DC
  Administered 2015-12-31 – 2016-01-01 (×2): 80 mg via ORAL
  Filled 2015-12-31 (×4): qty 1

## 2015-12-31 MED ORDER — LACTATED RINGERS IV SOLN
INTRAVENOUS | Status: DC | PRN
  Administered 2015-12-31 (×3): via INTRAVENOUS

## 2015-12-31 MED ORDER — ONDANSETRON HCL 4 MG/2ML IJ SOLN: 4.0000 mg | Freq: Three times a day (TID) | INTRAMUSCULAR | Status: DC | PRN

## 2015-12-31 MED ORDER — MORPHINE SULFATE (PF) 0.5 MG/ML IJ SOLN
INTRAMUSCULAR | Status: DC | PRN
  Administered 2015-12-31: .1 mg via INTRATHECAL

## 2015-12-31 MED ORDER — MENTHOL 3 MG MT LOZG: 1.0000 | LOZENGE | OROMUCOSAL | Status: DC | PRN

## 2015-12-31 MED ORDER — OXYTOCIN 10 UNIT/ML IJ SOLN: 2.5000 [IU]/h | INTRAVENOUS | Status: AC

## 2015-12-31 MED ORDER — MORPHINE SULFATE (PF) 0.5 MG/ML IJ SOLN
INTRAMUSCULAR | Status: AC
  Filled 2015-12-31: qty 10

## 2015-12-31 MED ORDER — ONDANSETRON HCL 4 MG/2ML IJ SOLN
INTRAMUSCULAR | Status: DC | PRN
  Administered 2015-12-31: 4 mg via INTRAVENOUS

## 2015-12-31 MED ORDER — SODIUM CHLORIDE 0.9% FLUSH
3.0000 mL | INTRAVENOUS | Status: DC | PRN
  Administered 2016-01-03: 3 mL via INTRAVENOUS
  Filled 2015-12-31: qty 3

## 2015-12-31 MED ORDER — DIPHENHYDRAMINE HCL 50 MG/ML IJ SOLN: 12.5000 mg | INTRAMUSCULAR | Status: DC | PRN

## 2015-12-31 MED ORDER — DEXAMETHASONE SODIUM PHOSPHATE 10 MG/ML IJ SOLN
INTRAMUSCULAR | Status: AC
  Filled 2015-12-31: qty 1

## 2015-12-31 MED ORDER — FENTANYL CITRATE (PF) 100 MCG/2ML IJ SOLN
INTRAMUSCULAR | Status: AC
  Filled 2015-12-31: qty 2

## 2015-12-31 MED ORDER — LACTATED RINGERS IV SOLN
INTRAVENOUS | Status: DC | PRN
  Administered 2015-12-31: 02:00:00 via INTRAVENOUS

## 2015-12-31 MED ORDER — PHENYLEPHRINE 8 MG IN D5W 100 ML (0.08MG/ML) PREMIX OPTIME
INJECTION | INTRAVENOUS | Status: AC
  Filled 2015-12-31: qty 100

## 2015-12-31 MED ORDER — CEFAZOLIN SODIUM-DEXTROSE 2-4 GM/100ML-% IV SOLN
INTRAVENOUS | Status: AC
  Filled 2015-12-31: qty 100

## 2015-12-31 MED ORDER — IBUPROFEN 600 MG PO TABS
600.0000 mg | ORAL_TABLET | Freq: Four times a day (QID) | ORAL | Status: DC
  Administered 2015-12-31 – 2016-01-03 (×13): 600 mg via ORAL
  Filled 2015-12-31 (×14): qty 1

## 2015-12-31 MED ORDER — METOCLOPRAMIDE HCL 5 MG/ML IJ SOLN
INTRAMUSCULAR | Status: DC | PRN
  Administered 2015-12-31: 10 mg via INTRAVENOUS

## 2015-12-31 MED ORDER — PHENYLEPHRINE 8 MG IN D5W 100 ML (0.08MG/ML) PREMIX OPTIME
INJECTION | INTRAVENOUS | Status: DC | PRN
  Administered 2015-12-31: 50 ug/min via INTRAVENOUS

## 2015-12-31 MED ORDER — DEXAMETHASONE SODIUM PHOSPHATE 10 MG/ML IJ SOLN
INTRAMUSCULAR | Status: DC | PRN
  Administered 2015-12-31: 10 mg via INTRAVENOUS

## 2015-12-31 MED ORDER — FENTANYL CITRATE (PF) 100 MCG/2ML IJ SOLN: 25.0000 ug | INTRAMUSCULAR | Status: DC | PRN

## 2015-12-31 MED ORDER — MEPERIDINE HCL 25 MG/ML IJ SOLN
INTRAMUSCULAR | Status: AC
  Filled 2015-12-31: qty 1

## 2015-12-31 MED ORDER — DIBUCAINE 1 % RE OINT: 1.0000 "application " | TOPICAL_OINTMENT | RECTAL | Status: DC | PRN

## 2015-12-31 MED ORDER — NALOXONE HCL 0.4 MG/ML IJ SOLN: 0.4000 mg | INTRAMUSCULAR | Status: DC | PRN

## 2015-12-31 MED ORDER — PRENATAL MULTIVITAMIN CH
1.0000 | ORAL_TABLET | Freq: Every day | ORAL | Status: DC
  Administered 2015-12-31 – 2016-01-03 (×4): 1 via ORAL
  Filled 2015-12-31 (×3): qty 1

## 2015-12-31 MED ORDER — ONDANSETRON HCL 4 MG/2ML IJ SOLN
INTRAMUSCULAR | Status: AC
Start: 2015-12-31 — End: 2015-12-31
  Filled 2015-12-31: qty 2

## 2015-12-31 MED ORDER — OXYTOCIN 10 UNIT/ML IJ SOLN
INTRAMUSCULAR | Status: AC
  Filled 2015-12-31: qty 4

## 2015-12-31 MED ORDER — LANOLIN HYDROUS EX OINT: 1.0000 "application " | TOPICAL_OINTMENT | CUTANEOUS | Status: DC | PRN

## 2015-12-31 MED ORDER — OXYCODONE HCL 5 MG PO TABS
10.0000 mg | ORAL_TABLET | ORAL | Status: DC | PRN
  Administered 2015-12-31 – 2016-01-03 (×18): 10 mg via ORAL
  Filled 2015-12-31 (×19): qty 2

## 2015-12-31 MED ORDER — SCOPOLAMINE 1 MG/3DAYS TD PT72: 1.0000 | MEDICATED_PATCH | Freq: Once | TRANSDERMAL | Status: DC

## 2015-12-31 MED ORDER — SIMETHICONE 80 MG PO CHEW: 80.0000 mg | CHEWABLE_TABLET | ORAL | Status: DC | PRN

## 2015-12-31 MED ORDER — TETANUS-DIPHTH-ACELL PERTUSSIS 5-2.5-18.5 LF-MCG/0.5 IM SUSP
0.5000 mL | Freq: Once | INTRAMUSCULAR | Status: DC
  Filled 2015-12-31: qty 0.5

## 2015-12-31 MED ORDER — SIMETHICONE 80 MG PO CHEW
80.0000 mg | CHEWABLE_TABLET | Freq: Three times a day (TID) | ORAL | Status: DC
Start: 2015-12-31 — End: 2016-01-04
  Administered 2015-12-31 – 2016-01-03 (×8): 80 mg via ORAL
  Filled 2015-12-31 (×7): qty 1

## 2015-12-31 MED FILL — Medication: Qty: 1 | Status: AC

## 2015-12-31 SURGICAL SUPPLY — 32 items
BRR ADH 6X5 SEPRAFILM 1 SHT (MISCELLANEOUS)
CHLORAPREP W/TINT 26ML (MISCELLANEOUS) ×4 IMPLANT
CLAMP CORD UMBIL (MISCELLANEOUS) IMPLANT
CLOSURE WOUND 1/2 X4 (GAUZE/BANDAGES/DRESSINGS) ×1
CONTAINER PREFILL 10% NBF 15ML (MISCELLANEOUS) IMPLANT
DRSG OPSITE POSTOP 4X10 (GAUZE/BANDAGES/DRESSINGS) ×4 IMPLANT
ELECT REM PT RETURN 9FT ADLT (ELECTROSURGICAL) ×4
ELECTRODE REM PT RTRN 9FT ADLT (ELECTROSURGICAL) ×2 IMPLANT
EXTRACTOR VACUUM M CUP 4 TUBE (SUCTIONS) IMPLANT
EXTRACTOR VACUUM M CUP 4' TUBE (SUCTIONS)
GLOVE BIOGEL PI IND STRL 6.5 (GLOVE) ×2 IMPLANT
GLOVE BIOGEL PI IND STRL 7.0 (GLOVE) ×6 IMPLANT
GLOVE BIOGEL PI INDICATOR 6.5 (GLOVE) ×2
GLOVE BIOGEL PI INDICATOR 7.0 (GLOVE) ×8
GLOVE SURG SS PI 6.0 STRL IVOR (GLOVE) ×4 IMPLANT
GOWN STRL REUS W/TWL LRG LVL3 (GOWN DISPOSABLE) ×8 IMPLANT
KIT ABG SYR 3ML LUER SLIP (SYRINGE) ×6 IMPLANT
NDL HYPO 25X5/8 SAFETYGLIDE (NEEDLE) IMPLANT
NEEDLE HYPO 25X5/8 SAFETYGLIDE (NEEDLE) ×8 IMPLANT
NS IRRIG 1000ML POUR BTL (IV SOLUTION) ×4 IMPLANT
PACK C SECTION WH (CUSTOM PROCEDURE TRAY) ×4 IMPLANT
PAD OB MATERNITY 4.3X12.25 (PERSONAL CARE ITEMS) ×4 IMPLANT
PENCIL SMOKE EVAC W/HOLSTER (ELECTROSURGICAL) ×4 IMPLANT
RTRCTR C-SECT PINK 25CM LRG (MISCELLANEOUS) ×3 IMPLANT
SEPRAFILM MEMBRANE 5X6 (MISCELLANEOUS) IMPLANT
SPONGE GAUZE 4X4 12PLY (GAUZE/BANDAGES/DRESSINGS) ×6 IMPLANT
STRIP CLOSURE SKIN 1/2X4 (GAUZE/BANDAGES/DRESSINGS) ×2 IMPLANT
SUT PLAIN 0 NONE (SUTURE) IMPLANT
SUT VIC AB 0 CT1 36 (SUTURE) ×16 IMPLANT
SUT VIC AB 4-0 KS 27 (SUTURE) ×4 IMPLANT
TOWEL OR 17X24 6PK STRL BLUE (TOWEL DISPOSABLE) ×4 IMPLANT
TRAY FOLEY CATH SILVER 14FR (SET/KITS/TRAYS/PACK) ×4 IMPLANT

## 2015-12-31 NOTE — Progress Notes (Signed)
Subjective: Postpartum Day 1: Cesarean Delivery Patient reports incisional pain and tolerating PO.    Objective: Vital signs in last 24 hours: Temp:  [97.1 F (36.2 C)-98.4 F (36.9 C)] 98.4 F (36.9 C) (04/03 0610) Pulse Rate:  [102-144] 144 (04/03 0610) Resp:  [16-31] 22 (04/03 0610) BP: (104-135)/(73-94) 135/90 mmHg (04/03 0610) SpO2:  [96 %-100 %] 98 % (04/03 0610) Weight:  [141 lb 6.4 oz (64.139 kg)] 141 lb 6.4 oz (64.139 kg) (04/02 1931)  Physical Exam:  General: alert, cooperative, appears stated age and no distress Lochia: appropriate Uterine Fundus: firm Incision: n/a DVT Evaluation: No evidence of DVT seen on physical exam. Negative Homan's sign. No cords or calf tenderness.   Recent Labs  12/30/15 2000 12/31/15 0634  HGB 7.6* 6.1*  HCT 25.6* 20.0*    Assessment/Plan: Status post Cesarean section. Doing well postoperatively.  Hgb 6.1. Pulse 120-140 Continue current care. To get 3 uts blood  April [name] [name] 12/31/2015, 7:35 AM

## 2015-12-31 NOTE — Addendum Note (Signed)
Addendum  created 12/31/15 1745 by Armanda Heritageharlesetta M Rodina Pinales, CRNA   Modules edited: Clinical Notes   Clinical Notes:  File: [id number]

## 2015-12-31 NOTE — Progress Notes (Signed)
Patient ID: April [name], female   DOB: 1991/08/30, 25 y.o.   MRN: [id number] Called to evaluate patient with tachycardia and decreased urine output. Patient reports some generalized weakness and knows that she has been anemic for a while. Patient has not been up to ambulate yet since her surgery. She denies chest pain or shortness of breath  Blood pressure 135/90, pulse 144, temperature 98.4 F (36.9 C), temperature source Core (Comment), resp. rate 22, height 5\' 1"  (1.549 m), weight 141 lb 6.4 oz (64.139 kg), last menstrual period 03/30/2015, SpO2 98 %.  GENERAL: Well-developed, well-nourished female in no acute distress.  ABDOMEN: Soft, appropriately tender, nondistended. Fundus firm Dressing: clean/dry and intact PELVIC: Normal lochia EXTREMITIES: No cyanosis, clubbing, or edema, 2+ distal pulses.  A/P 25 yo POD#0 PLTCS - Hemoglobin now at 6 - Will transfuse 3 units pRBC - Continue close monitoring of I&O - Continue current postpartum care

## 2015-12-31 NOTE — Consult Note (Signed)
[facility]  Delivery Note: C-section 12/31/2015 01:32 AM  I was called to the operating room at the request of the patient's obstetrician (Dr. Jolayne Pantheronstant) for a c-section of di-di twins.  PRENATAL HX: This is a 25 y/o G4P1021 at 8435 and 6/[redacted] weeks gestation with di-di twins admitted for SROM at 1700 yesterday (ROM 8.5 hours). In addition to multiple gestation, her pregnancy has been complicated by rh negative status, rubella non-immune, history of genital herpes for which she is on acyclovir, and smoking. She also received a recent course of BMZ on 3/20 and 3/21 due to concerns of preterm labor.  DELIVERY Infant A: Infant was vigorous at delivery, requiring no resuscitation other than standard warming, drying and stimulation. APGARs 8 and 9. O2 saturation in high 90s by 10 minutes. Exam within normal limits. After 10 minutes, baby left with nurse to assist parents with skin-to-skin care.   DELIVERY Infant B: Infant was vigorous at delivery, requiring no resuscitation other than standard warming, drying and stimulation. APGARs 8 and 9. O2 saturation low 90s by 5 minutes. Exam within normal limits. After 5 minutes, baby left with nurse to assist parents with skin-to-skin care.   _____________________ Electronically Signed By: Maryan CharLindsey Seletha Zimmermann, MD Neonatologist

## 2015-12-31 NOTE — Anesthesia Postprocedure Evaluation (Signed)
Anesthesia Post Note  Patient: April [name]  Procedure(s) Performed: Procedure(s) (LRB): CESAREAN SECTION (N/A)  Patient location during evaluation: PACU Anesthesia Type: Spinal Level of consciousness: oriented and awake and alert Pain management: pain level controlled Vital Signs Assessment: post-procedure vital signs reviewed and stable Respiratory status: spontaneous breathing, respiratory function stable and patient connected to nasal cannula oxygen Cardiovascular status: blood pressure returned to baseline and stable Postop Assessment: no headache, no backache and spinal receding Anesthetic complications: no    Last Vitals:  Filed Vitals:   12/31/15 0028 12/31/15 0219  BP: 127/73 104/79  Pulse: 105 108  Temp: 36.6 C 36.6 C  Resp: 16 20    Last Pain:  Filed Vitals:   12/31/15 0237  PainSc: 3                  Phillips Groutarignan, Iszabella Hebenstreit

## 2015-12-31 NOTE — Addendum Note (Signed)
Addendum  created 12/31/15 0733 by Junious SilkMelinda Shonya Sumida, CRNA   Modules edited: Clinical Notes   Clinical Notes:  File: [id number]

## 2015-12-31 NOTE — Progress Notes (Signed)
Patient had just finished receiving the first unit of blood. Her pulse rate had been slowly increasing, usually higher when her pain was increased and she moved in bed. Her pain was 10, but before the new tubing could be hung and IV dilaudid given, her pulse increased to 203, B/P 156/114. Called Dr Ashok PallWouk to come and called rapid response team. She was given her IV Dilaudid, a second IV was started and saline bolus run, and a stat EKG done. The patient slowly responded to this with a slow lowering of her heart rate and B/P. Dr Ashok PallWouk initially did not feel this was a blood transfusion reaction due to her response, but decided to treat it as if it was. Labs were ordered and transfusion reaction form was completed. Dr Ashok PallWouk decided to continue with the next 2 units of blood with more frequent checks of B/P and heart rate. Continued with vitals check every 15 minutes while blood hung.

## 2015-12-31 NOTE — Op Note (Signed)
April [name] PROCEDURE DATE: 12/30/2015 - 12/31/2015  PREOPERATIVE DIAGNOSIS: Intrauterine pregnancy at  [redacted]w[redacted]d weeks gestation with di-di twins; malpresentation: vertex/breech  POSTOPERATIVE DIAGNOSIS: The same  PROCEDURE:     Cesarean Section  SURGEON:  April [name]  ASSISTANT: none  INDICATIONS: April [name] is a 25 y.o. E4V4098 at [redacted]w[redacted]d scheduled for cesarean section secondary to malpresentation: vertex/breech.  The risks of cesarean section discussed with the patient included but were not limited to: bleeding which may require transfusion or reoperation; infection which may require antibiotics; injury to bowel, bladder, ureters or other surrounding organs; injury to the fetus; need for additional procedures including hysterectomy in the event of a life-threatening hemorrhage; placental abnormalities wth subsequent pregnancies, incisional problems, thromboembolic phenomenon and other postoperative/anesthesia complications. The patient concurred with the proposed plan, giving informed written consent for the procedure.    FINDINGS:  Viable female (baby A) and female (baby B infants in cephalic and breech presentation.  Apgars 8 and 9 for both infants.  Clear amniotic fluid for baby A and thin meconium for baby B.  Intact placenta, three vessel cord x 2.  Normal uterus, fallopian tubes and ovaries bilaterally.  ANESTHESIA:    Spinal INTRAVENOUS FLUIDS:2200 ml ESTIMATED BLOOD LOSS: 700 ml URINE OUTPUT:  200 ml SPECIMENS: Placenta sent to pathology COMPLICATIONS: None immediate  PROCEDURE IN DETAIL:  The patient received intravenous antibiotics and had sequential compression devices applied to her lower extremities while in the preoperative area.  She was then taken to the operating room where anesthesia was induced and was found to be adequate. A foley catheter was placed into her bladder and attached to April [name] gravity. She was then placed in a dorsal supine position with a leftward  tilt, and prepped and draped in a sterile manner. After an adequate timeout was performed, a Pfannenstiel skin incision was made with scalpel and carried through to the underlying layer of fascia. The fascia was incised in the midline and this incision was extended bilaterally using the Mayo scissors. Kocher clamps were applied to the superior aspect of the fascial incision and the underlying rectus muscles were dissected off bluntly. A similar process was carried out on the inferior aspect of the facial incision. The rectus muscles were separated in the midline bluntly and the peritoneum was entered bluntly. The Alexis self-retaining retractor was introduced into the abdominal cavity. Attention was turned to the lower uterine segment where a bladder flap was created, and a transverse hysterotomy was made with a scalpel and extended bilaterally bluntly. The infants were successfully delivered( baby A from vertex position and baby B from a breech extraction), and cords were clamped and cut after 1 minute for both and infants were handed over to awaiting neonatology team. Uterine massage was then administered and the placenta delivered intact with three-vessel cord. The uterus was cleared of clot and debris.  The hysterotomy was closed with 0 Vicryl in a running locked fashion, and an imbricating layer was also placed with a 0 Vicryl. Overall, excellent hemostasis was noted. The pelvis copiously irrigated and cleared of all clot and debris. Hemostasis was confirmed on all surfaces.  The peritoneum and the muscles were reapproximated using 0 vicryl interrupted stitches. The fascia was then closed using 0 Vicryl in a running fashion.  The skin was closed in a subcuticular fashion using 3.0 Vicryl. The patient tolerated the procedure well. Sponge, lap, instrument and needle counts were correct x 2. She was taken to the recovery room in  stable condition.    April [name],April [name]  12/31/2015 1:56 AM

## 2015-12-31 NOTE — Anesthesia Postprocedure Evaluation (Signed)
Anesthesia Post Note  Patient: April [name]  Procedure(s) Performed: Procedure(s) (LRB): CESAREAN SECTION (N/A)  Patient location during evaluation: Mother Baby Anesthesia Type: Spinal Level of consciousness: oriented and awake and alert Pain management: pain level controlled Vital Signs Assessment: post-procedure vital signs reviewed and stable Respiratory status: spontaneous breathing and respiratory function stable Cardiovascular status: blood pressure returned to baseline and stable Postop Assessment: no headache and no backache Anesthetic complications: no    Last Vitals:  Filed Vitals:   12/31/15 0500 12/31/15 0610  BP: 133/89 135/90  Pulse: 128 144  Temp: 36.9 C 36.9 C  Resp: 20 22    Last Pain:  Filed Vitals:   12/31/15 0657  PainSc: 3                  Shaquan Missey

## 2015-12-31 NOTE — Anesthesia Procedure Notes (Signed)
Spinal Patient location during procedure: OR Staffing Anesthesiologist: Addelyn Alleman Performed by: anesthesiologist  Preanesthetic Checklist Completed: patient identified, site marked, surgical consent, pre-op evaluation, timeout performed, IV checked, risks and benefits discussed and monitors and equipment checked Spinal Block Patient position: sitting Prep: ChloraPrep Patient monitoring: heart rate, continuous pulse ox and blood pressure Approach: midline Location: L3-4 Injection technique: single-shot Needle Needle type: Sprotte  Needle gauge: 24 G Needle length: 9 cm Additional Notes Expiration date of kit checked and confirmed. Patient tolerated procedure well, without complications.     

## 2015-12-31 NOTE — Progress Notes (Signed)
S: Called to patient bedside to assess patient tachycardia. Patient is s/p LTCS this AM for twins. Found to be severely anemic on admission with H 7.6, trending to 6.1 immediate post-op. Patient's HR 120-140s post-op. Patient had just finished receive first of 3 units PRBCs when developed tachycardia to 190s. No rash, no shortness of breath, no tachypnea, no fever.  O: Initial HR 190s, BP 100s-120s/70s, temp 98s-99 Lungs clear Tachycardic Skin very pale  ECG: sinus rhythm 190s  A/P:24 yo [id number] s/p PLTCS for twins, now with symptomatic severe anemia. Initial ddx included transfusion rxn as symptoms began shortly after infusion of first unit, but afebrile, lungs clear, no hypotension.  I ordered stat ECG which was most consistent w/ sinus tachycardia. I had placed a 2nd PIV and rapidly infused 1 L NS which succeeded in calming HR to 150s. I had the second unit of PRBCs brought to the room and hung. Transfusion labs ordered (DAT, type and screen). Also obtained CBC. Will continue to monitor closely.

## 2015-12-31 NOTE — Progress Notes (Signed)
Dr Jolayne Pantherconstant notified of increased hr 140-160    And output of only 100 in the last 3 hrs    Bolus given and cbc ordered

## 2015-12-31 NOTE — Progress Notes (Signed)
Called to 102 for Rapid Response, STAT EKG for tachycardia. EKG done, and pt's SAO2 was 99% on RA and the decision was made by Anesthesia for pt. To stay on RA for now. Will continue to monitor.

## 2015-12-31 NOTE — Progress Notes (Signed)
Notified Dr Donetta PottsKayo of increased pain =10 in incision area. UE. Bleeding only bsmall amount. Dressing CDI. Dr Cleotis LemaK Mayo came and assessed patient. Will increase pain meds if vitals are WNL.

## 2015-12-31 NOTE — Anesthesia Postprocedure Evaluation (Signed)
Anesthesia Post Note  Patient: April RamsayRachel L Hagan  Procedure(s) Performed: Procedure(s) (LRB): CESAREAN SECTION (N/A)  Patient location during evaluation: Mother Baby Anesthesia Type: Epidural Level of consciousness: awake and alert Pain management: pain level controlled Vital Signs Assessment: post-procedure vital signs reviewed and stable Respiratory status: spontaneous breathing Cardiovascular status: blood pressure returned to baseline Postop Assessment: no headache, adequate PO intake, no backache, patient able to bend at knees, epidural receding and no signs of nausea or vomiting Anesthetic complications: no    Last Vitals:  Filed Vitals:   12/31/15 1640 12/31/15 1653  BP: 127/75 123/81  Pulse: 129 147  Temp:    Resp:      Last Pain:  Filed Vitals:   12/31/15 1654  PainSc: 3                  Donnajean Chesnut, Weldon Pickingharlesetta Marie

## 2015-12-31 NOTE — Progress Notes (Signed)
Called vitals to Dr Cleotis LemaK Mayo. Will increase pain med but will also have lab draw CMET. Will give 1mg  Dilaudid now

## 2015-12-31 NOTE — Transfer of Care (Signed)
Immediate Anesthesia Transfer of Care Note  Patient: April [name]  Procedure(s) Performed: Procedure(s): CESAREAN SECTION (N/A)  Patient Location: PACU  Anesthesia Type:Spinal  Level of Consciousness: awake  Airway & Oxygen Therapy: Patient Spontanous Breathing  Post-op Assessment: Report given to RN and Post -op Vital signs reviewed and stable  Post vital signs: stable  Last Vitals:  Filed Vitals:   12/30/15 2336 12/31/15 0028  BP: 130/77 127/73  Pulse: 107 105  Temp:  36.6 C  Resp: 16 16    Complications: No apparent anesthesia complications

## 2016-01-01 ENCOUNTER — Encounter (HOSPITAL_COMMUNITY): Payer: Self-pay | Admitting: Obstetrics and Gynecology

## 2016-01-01 LAB — CBC
HCT: 22.3 % — ABNORMAL LOW (ref 36.0–46.0)
HEMOGLOBIN: 7.7 g/dL — AB (ref 12.0–15.0)
MCH: 26.5 pg (ref 26.0–34.0)
MCHC: 34.5 g/dL (ref 30.0–36.0)
MCV: 76.6 fL — ABNORMAL LOW (ref 78.0–100.0)
Platelets: 149 10*3/uL — ABNORMAL LOW (ref 150–400)
RBC: 2.91 MIL/uL — ABNORMAL LOW (ref 3.87–5.11)
RDW: 17.7 % — AB (ref 11.5–15.5)
WBC: 17.2 10*3/uL — ABNORMAL HIGH (ref 4.0–10.5)

## 2016-01-01 LAB — TRANSFUSION REACTION
DAT C3: NEGATIVE
POST RXN DAT IGG: NEGATIVE

## 2016-01-01 LAB — RPR: RPR: NONREACTIVE

## 2016-01-01 MED ORDER — RHO D IMMUNE GLOBULIN 1500 UNIT/2ML IJ SOSY
300.0000 ug | PREFILLED_SYRINGE | Freq: Once | INTRAMUSCULAR | Status: AC
  Administered 2016-01-01: 300 ug via INTRAVENOUS
  Filled 2016-01-01: qty 2

## 2016-01-01 NOTE — Progress Notes (Signed)
Post Partum Day 1 Subjective: up ad lib, tolerating PO and complaining of abdominal soreness; denies feeling dizzy when up  Objective: Blood pressure 125/84, pulse 115, temperature 98 F (36.7 C), temperature source Oral, resp. rate 20, height 5\' 1"  (1.549 m), weight 141 lb 6.4 oz (64.139 kg), last menstrual period 03/30/2015, SpO2 99 %.  Physical Exam:  General: alert, cooperative and appears stated age Lochia: appropriate Uterine Fundus: firm Incision: no significant drainage DVT Evaluation: No evidence of DVT seen on physical exam.   Recent Labs  12/31/15 2124 01/01/16 0505  HGB 8.5* 7.7*  HCT 25.2* 22.3*    Assessment/Plan: Continue routine orders   LOS: 1 day   Clemmons,Lori Grissett 01/01/2016, 7:44 AM
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
## 2016-01-01 NOTE — Addendum Note (Signed)
Addendum  created 01/01/16 1238 by Doree Fudgeolleen S Briggitte Boline, CRNA   Modules edited: Charges VN

## 2016-01-02 ENCOUNTER — Inpatient Hospital Stay (HOSPITAL_COMMUNITY): Payer: Medicaid Other

## 2016-01-02 ENCOUNTER — Encounter (HOSPITAL_COMMUNITY): Payer: Self-pay | Admitting: Certified Registered Nurse Anesthetist

## 2016-01-02 LAB — CBC
HCT: 18.3 % — ABNORMAL LOW (ref 36.0–46.0)
HCT: 26.5 % — ABNORMAL LOW (ref 36.0–46.0)
HEMOGLOBIN: 6.2 g/dL — AB (ref 12.0–15.0)
Hemoglobin: 8.7 g/dL — ABNORMAL LOW (ref 12.0–15.0)
MCH: 26.4 pg (ref 26.0–34.0)
MCH: 26 pg (ref 26.0–34.0)
MCHC: 33.9 g/dL (ref 30.0–36.0)
MCHC: 32.8 g/dL (ref 30.0–36.0)
MCV: 77.9 fL — ABNORMAL LOW (ref 78.0–100.0)
MCV: 79.3 fL (ref 78.0–100.0)
PLATELETS: 173 10*3/uL (ref 150–400)
Platelets: 159 K/uL (ref 150–400)
RBC: 2.35 MIL/uL — AB (ref 3.87–5.11)
RBC: 3.34 MIL/uL — ABNORMAL LOW (ref 3.87–5.11)
RDW: 18.2 % — ABNORMAL HIGH (ref 11.5–15.5)
RDW: 17.4 % — ABNORMAL HIGH (ref 11.5–15.5)
WBC: 14.3 10*3/uL — ABNORMAL HIGH (ref 4.0–10.5)
WBC: 13.3 K/uL — ABNORMAL HIGH (ref 4.0–10.5)

## 2016-01-02 LAB — RH IG WORKUP (INCLUDES ABO/RH)
ABO/RH(D): O NEG
Fetal Screen: NEGATIVE
GESTATIONAL AGE(WKS): 35
UNIT DIVISION: 0

## 2016-01-02 LAB — PREPARE RBC (CROSSMATCH)

## 2016-01-02 MED ORDER — IOHEXOL 300 MG/ML  SOLN
100.0000 mL | Freq: Once | INTRAMUSCULAR | Status: AC | PRN
  Administered 2016-01-02: 100 mL via INTRAVENOUS

## 2016-01-02 MED ORDER — HYDROMORPHONE HCL 2 MG PO TABS
1.0000 mg | ORAL_TABLET | ORAL | Status: DC | PRN
  Administered 2016-01-02: 1 mg via ORAL
  Filled 2016-01-02 (×2): qty 1

## 2016-01-02 MED ORDER — SODIUM CHLORIDE 0.9 % IV SOLN
Freq: Once | INTRAVENOUS | Status: AC
  Administered 2016-01-02: 13:00:00 via INTRAVENOUS

## 2016-01-02 MED ORDER — NICOTINE 14 MG/24HR TD PT24
14.0000 mg | MEDICATED_PATCH | Freq: Every day | TRANSDERMAL | Status: DC
  Filled 2016-01-02 (×3): qty 1

## 2016-01-02 MED ORDER — HYDROMORPHONE HCL 1 MG/ML IJ SOLN: 1.0000 mg | INTRAMUSCULAR | Status: DC | PRN

## 2016-01-02 NOTE — Progress Notes (Addendum)
Post Op Day 2 Subjective:  April [name] is a 25 y.o. [id number] 5068w6d s/p LTCS.  No acute events overnight.  Pt denies problems with ambulating, voiding or po intake.  She denies nausea or vomiting.  Pain is moderately controlled.  She has had flatus. She has not had bowel movement.  Lochia Moderate.  Plan for birth control is bilateral tubal ligation.  Method of Feeding: bottle.  She is reporting dizziness and moderate LE swelling this morning.  Objective: Blood pressure 138/77, pulse 122, temperature 98.1 F (36.7 C), temperature source Oral, resp. rate 18, height 5\' 1"  (1.549 m), weight 141 lb 6.4 oz (64.139 kg), last menstrual period 03/30/2015, SpO2 99 %, unknown if currently breastfeeding.  Physical Exam:  General: alert, cooperative and no distress, extremely pale appearing on exam Lochia:normal flow Chest: CTAB Heart: tachycardic, no r/g, +1/6 systolic murmur LSB Abdomen: +BS, soft, +TTP to uterus, incision well appearing, dressing about 25% saturated  Uterine Fundus: firm, just below level of umbilicus DVT Evaluation: No evidence of DVT seen on physical exam. Extremities: +2 pitting edema to mid calf   Recent Labs  12/31/15 2124 01/01/16 0505  HGB 8.5* 7.7*  HCT 25.2* 22.3*    Assessment/Plan:  ASSESSMENT: April [name] is a 25 y.o. [id number] 7368w6d s/p LTCS  CBC ordered.  Discharge home pending this result.   LOS: 2 days   Delynn FlavinAshly Gottschalk, DO 01/02/2016, 7:30 AM   OB fellow attestation:  I have seen and examined this patient; I agree with above documentation in the resident's note.   Federico FlakeKimberly Niles Chanese Hartsough, MD 9:45 AM

## 2016-01-02 NOTE — Progress Notes (Signed)
S: Called to speak w/ pt about transfusion. Dizzy when standing. Last flatus 7 AM  O: Filed Vitals:   01/02/16 0403 01/02/16 0800  BP: 138/77   Pulse: 122 100  Temp: 98.1 F (36.7 C)   Resp: 18   abdomen mildly distended, faint bowel sounds buising mons and labia majora Incision intact 2+ LE edema  A/P: 25 yo G4P1123 pod 2. S/p txfn 3 units. Initial H 7.6, up to 8.5 after 3 units. Now down to 6.2 and symptomatic. Probable hematoma below incision. Possible ileus. Will transfuse 2 units and obtain ct abdomen/pelvis. Hemodynamically stable.

## 2016-01-02 NOTE — Progress Notes (Signed)
Patient's support person came to the nursery door and said that we needed to come and pick up the baby's so the patient could "go outside". I went to the room and discussed with the patient and 2 support/family members in the room about keeping the babies in the room and feeding them. Patient c/o "nurses being rude" and that she did not get any sleep last night because she was up with the babies. The babies were taken to the nursery on the night of 01/01/16 so that the mother could sleep, per the mother baby RN. The patient has had a support person with her the entire stay. She states that her fiancee will be here this evening and will stay all night. The support persons and the patient stated understanding of the policy of baby friendly.

## 2016-01-03 LAB — TYPE AND SCREEN
ABO/RH(D): O NEG
ANTIBODY SCREEN: POSITIVE
DAT, IGG: NEGATIVE
UNIT DIVISION: 0
UNIT DIVISION: 0
Unit division: 0
Unit division: 0
Unit division: 0

## 2016-01-03 LAB — RAPID URINE DRUG SCREEN, HOSP PERFORMED
AMPHETAMINES: NOT DETECTED
Barbiturates: NOT DETECTED
Benzodiazepines: POSITIVE — AB
COCAINE: NOT DETECTED
OPIATES: NOT DETECTED
Tetrahydrocannabinol: NOT DETECTED

## 2016-01-03 LAB — CBC
HEMATOCRIT: 25.3 % — AB (ref 36.0–46.0)
Hemoglobin: 8.5 g/dL — ABNORMAL LOW (ref 12.0–15.0)
MCH: 26.3 pg (ref 26.0–34.0)
MCHC: 33.6 g/dL (ref 30.0–36.0)
MCV: 78.3 fL (ref 78.0–100.0)
PLATELETS: 176 10*3/uL (ref 150–400)
RBC: 3.23 MIL/uL — ABNORMAL LOW (ref 3.87–5.11)
RDW: 17.2 % — ABNORMAL HIGH (ref 11.5–15.5)
WBC: 11.1 10*3/uL — AB (ref 4.0–10.5)

## 2016-01-03 MED ORDER — OXYCODONE-ACETAMINOPHEN 10-325 MG PO TABS: 1.0000 | ORAL_TABLET | ORAL | Status: DC | PRN

## 2016-01-03 MED ORDER — PROMETHAZINE HCL 25 MG PO TABS
25.0000 mg | ORAL_TABLET | Freq: Four times a day (QID) | ORAL | Status: DC | PRN
  Administered 2016-01-03: 25 mg via ORAL
  Filled 2016-01-03: qty 1

## 2016-01-03 MED ORDER — IBUPROFEN 600 MG PO TABS: 600.0000 mg | ORAL_TABLET | Freq: Four times a day (QID) | ORAL | Status: DC

## 2016-01-03 NOTE — Discharge Instructions (Signed)
Your hemoglobin was low while you were in the hospital and you needed 5 units of blood. Please schedule a nursing visit in 1 week to recheck your hemoglobin levels. You will also need to schedule a postpartum appointment in 6 weeks. If you start having dizziness when you stand, shortness of breath, or if you feel like your heart is beating funny in your chest, please see your doctor.  Cesarean Delivery, Care After Refer to this sheet in the next few weeks. These instructions provide you with information on caring for yourself after your procedure. Your health care provider may also give you specific instructions. Your treatment has been planned according to current medical practices, but problems sometimes occur. Call your health care provider if you have any problems or questions after you go home. HOME CARE INSTRUCTIONS  Only take over-the-counter or prescription medications as directed by your health care provider.  Do not drink alcohol, especially if you are breastfeeding or taking medication to relieve pain.  Do not chew or smoke tobacco.  Continue to use good perineal care. Good perineal care includes:  Wiping your perineum from front to back.  Keeping your perineum clean.  Check your surgical cut (incision) daily for increased redness, drainage, swelling, or separation of skin.  Clean your incision gently with soap and water every day, and then pat it dry. If your health care provider says it is okay, leave the incision uncovered. Use a bandage (dressing) if the incision is draining fluid or appears irritated. If the adhesive strips across the incision do not fall off within 7 days, carefully peel them off.  Hug a pillow when coughing or sneezing until your incision is healed. This helps to relieve pain.  Do not use tampons or douche until your health care provider says it is okay.  Shower, wash your hair, and take tub baths as directed by your health care provider.  Wear a  well-fitting bra that provides breast support.  Limit wearing support panties or control-top hose.  Drink enough fluids to keep your urine clear or pale yellow.  Eat high-fiber foods such as whole grain cereals and breads, brown rice, beans, and fresh fruits and vegetables every day. These foods may help prevent or relieve constipation.  Resume activities such as climbing stairs, driving, lifting, exercising, or traveling as directed by your health care provider.  Talk to your health care provider about resuming sexual activities. This is dependent upon your risk of infection, your rate of healing, and your comfort and desire to resume sexual activity.  Try to have someone help you with your household activities and your newborn for at least a few days after you leave the hospital.  Rest as much as possible. Try to rest or take a nap when your newborn is sleeping.  Increase your activities gradually.  Keep all of your scheduled postpartum appointments. It is very important to keep your scheduled follow-up appointments. At these appointments, your health care provider will be checking to make sure that you are healing physically and emotionally. SEEK MEDICAL CARE IF:   You are passing large clots from your vagina. Save any clots to show your health care provider.  You have a foul smelling discharge from your vagina.  You have trouble urinating.  You are urinating frequently.  You have pain when you urinate.  You have a change in your bowel movements.  You have increasing redness, pain, or swelling near your incision.  You have pus draining from your  incision.  Your incision is separating.  You have painful, hard, or reddened breasts.  You have a severe headache.  You have blurred vision or see spots.  You feel sad or depressed.  You have thoughts of hurting yourself or your newborn.  You have questions about your care, the care of your newborn, or medications.  You  are dizzy or light-headed.  You have a rash.  You have pain, redness, or swelling at the site of the removed intravenous access (IV) tube.  You have nausea or vomiting.  You stopped breastfeeding and have not had a menstrual period within 12 weeks of stopping.  You are not breastfeeding and have not had a menstrual period within 12 weeks of delivery.  You have a fever. SEEK IMMEDIATE MEDICAL CARE IF:  You have persistent pain.  You have chest pain.  You have shortness of breath.  You faint.  You have leg pain.  You have stomach pain.  Your vaginal bleeding saturates 2 or more sanitary pads in 1 hour. MAKE SURE YOU:   Understand these instructions.  Will watch your condition.  Will get help right away if you are not doing well or get worse.   This information is not intended to replace advice given to you by your health care provider. Make sure you discuss any questions you have with your health care provider.   Document Released: 06/07/2002 Document Revised: 10/06/2014 Document Reviewed: 05/12/2012 Elsevier Interactive Patient Education [employer]

## 2016-01-03 NOTE — Progress Notes (Signed)
Patient anxious this am  States has had no sleep and needs babies to be taken to nursery for 4-5 hpurs/  Maintenance overheard patient saying  " I cant take care of these babies and they wont keep them in the nursery  Patient requesting pain meds then states she is  going outside to smoke with boyfriend   [facility] and Child psychotherapistocial worker notified

## 2016-01-03 NOTE — Progress Notes (Signed)
CSW received update from RN regarding MOB's presentation since initial consult completed on 4/4.    CSW informed that MOB continues to be in pain and has required blood transfusions.  CSW informed that the MOB has been overwhelmed by caring for the twins, and that the infants have been in CN for large portions of the evening in order to provide MOB with an opportunity to sleep.  CSW followed up with MOB. FOB and MOB's sister also present during the visit. MOB's mood and affect congruent with her report of continuing to have ongoing pain. She stated that she is retaining fluid, and it continues to be unclear what her plan of care will be to address her physical needs.  MOB stated that she is "tired" and "exhausted".  CSW provided supportive listening as MOB discussed her frustrations secondary to physical complications and lack of sleep.  MOB shared belief that she will be able to cope with caring for herself and her infants. She stated that she has a great support system, and she knows that her current stressors are only temporary.  MOB and FOB have discussed their positive interactions with the infants, and stated that despite their frustrations and the MOB's pain, they both have been able to bond and enjoy their experiences with the infants.  MOB reassured CSW that she is "doing okay", and that she is coping as best as she can with the pain, exhaustion, and caring for the infants.    MOB denied questions, concerns, or needs at this time.  She expressed appreciation for the visit, and agreed to contact CSW if additional needs arise.  

## 2016-01-03 NOTE — Progress Notes (Signed)
Post Operative Day 3 Subjective: no complaints, up ad lib, voiding, tolerating PO and + flatus  Objective: Blood pressure 129/92, pulse 80, temperature 97.5 F (36.4 C), temperature source Oral, resp. rate 18, height 5\' 1"  (1.549 m), weight 141 lb 6.4 oz (64.139 kg), last menstrual period 03/30/2015, SpO2 99 %, unknown if currently breastfeeding.  Physical Exam:  General: alert and cooperative Lochia: appropriate Uterine Fundus: firm Incision: healing well, moderate drainage on honeycomb dressing DVT Evaluation: No evidence of DVT seen on physical exam.   Recent Labs  01/02/16 1934 01/03/16 0506  HGB 8.7* 8.5*  HCT 26.5* 25.3*    Assessment/Plan: Some concern by Pediatrician that Pt is "doped up". Pt has been leaving the floor to go outside, so there is some concern that she is taking other medications that we are unaware of. She is getting Oxycodone q4hrs prn for pain control.  - Will order a UDS - Discharge later today vs tomorrow   LOS: 3 days   Hilton SinclairKaty D Mayo 01/03/2016, 12:56 PM   OB fellow attestation:  I have seen and examined this patient; I agree with above documentation in the resident's note.   Federico FlakeKimberly Niles Tae Robak, MD 9:09 PM

## 2016-01-03 NOTE — Discharge Summary (Signed)
OB Discharge Summary  Patient Name: April [name] DOB: [birth year] MRN: [id number]  Date of admission: 12/30/2015 Delivering MD:    Korri, Ask [[id number]]  CONSTANT, Sabiha, Sura [[id number]]     Date of discharge: 01/03/2016  Admitting diagnosis: 8 MTHS, TWINS, WATER BROKE Intrauterine pregnancy: [redacted]w[redacted]d     Secondary diagnosis:Active Problems:   TOBACCO ABUSE   RhD negative   Depressive disorder   Twin pregnancy, antepartum condition or complication   Genital HSV   Rubella non-immune status, antepartum   Dichorionic diamniotic twin pregnancy, antepartum   S/P cesarean section  Additional problems: Severe symptomatic anemia requiring blood transfusion of 5 units. See hospital course below for details.    Discharge diagnosis: Preterm Pregnancy Delivered and Anemia                                                                     Post partum procedures:blood transfusion- 5 total units  Augmentation: n/a  Complications: None   Hospital course:  Onset of Labor With Unplanned C/S  25 y.o. yo [id number] at [redacted]w[redacted]d was admitted in Active Labor on 12/30/2015 and was brought to CS for malpresentation.  Patient had a labor course significant for severe anemia. Pt developed symptomatic anemia after her c/s. Her hemoglobin was initially 7.6 and dropped to 5.7 after c/s. She was dizzy and pale. She received 3 units of pRBCs and she subsequently had an increase in HR to the low 200s. Stat EKG was performed and showed sinus rhythm. She was given a 1L bolus of fluids and her HR came down to the 150s. Her post-transfusion Hgb was 8.5. Her Hgb subsequently dropped to 6.2 and she was transfused another 2 units. CT pelvis was performed and showed a 7cm x 6cm x 7cm hematoma in the right lower pelvis that did not have any evidence of active bleeding. Her post-transfusion Hgb was 8.7, and her Hgb the following day was stable at 8.5. She was noted to have some bruising and edema  of the mons and labia majora. On the day of discharge, her HRs were in the 80s-90s.  During her hospitalization, Pt was noted to be acting very sleepy and out of it. She kept going outside to smoke, so there was concern that she was taking substances outside in addition to the pain medication that we were prescribing. We ordered a UDS, but she would not give a urine sample. She was also seen by SW, who did not have any additional recommendations.    Nadeen, Shipman [[id number]]      Dalayza, Zambrana [[id number]]     ,   VOZDGU, YQIHK VQQVZD [[id number]]      Pernell, Dikes [[id number]]       The patient went for cesarean section due to Malpresentation and Multifetal Gestation, and delivered two Viable infants,   Pinky, Ravan [[id number]]  12/31/2015   Faelyn, Sigler [[id number]]  12/31/2015  Details of operation can be found in separate operative note. Patient had an uncomplicated postpartum course.  She is ambulating,tolerating a regular diet, passing flatus, and urinating well.  Patient is discharged home in stable condition 01/03/2016.  Physical exam  Filed Vitals:  01/02/16 1645 01/02/16 1830 01/03/16 0655 01/03/16 1757  BP: 124/87 118/96 129/92 134/96  Pulse: 93 80 80 93  Temp: 98 F (36.7 C) 97.6 F (36.4 C) 97.5 F (36.4 C) 98.5 F (36.9 C)  TempSrc: Oral Oral  Oral  Resp: 18 20 18 18   Height:      Weight:      SpO2:       General: alert and no distress Lochia: appropriate Uterine Fundus: firm Incision: Moderate drainage present on bandage, no surrounding erythema DVT Evaluation: No evidence of DVT seen on physical exam. Calf/Ankle edema is present Labs: Lab Results  Component Value Date   WBC 11.1* 01/03/2016   HGB 8.5* 01/03/2016   HCT 25.3* 01/03/2016   MCV 78.3 01/03/2016   PLT 176 01/03/2016   CMP Latest Ref Rng 12/31/2015  Glucose 65 - 99 mg/dL 92  BUN 6 - 20 mg/dL 6  Creatinine 4.090.44 - 8.111.00 mg/dL 9.140.56  Sodium 782135 - 956145  mmol/L 130(L)  Potassium 3.5 - 5.1 mmol/L 4.2  Chloride 101 - 111 mmol/L 101  CO2 22 - 32 mmol/L 20(L)  Calcium 8.9 - 10.3 mg/dL 8.2(L)  Total Protein 6.5 - 8.1 g/dL 4.8(L)  Total Bilirubin 0.3 - 1.2 mg/dL 0.7  Alkaline Phos 38 - 126 U/L 166(H)  AST 15 - 41 U/L 48(H)  ALT 14 - 54 U/L 24    Discharge instruction: per After Visit Summary and "Baby and Me Booklet".  After Visit Meds:    Medication List    TAKE these medications        acyclovir 200 MG capsule  Commonly known as:  ZOVIRAX  Take 400 mg by mouth 3 (three) times daily as needed (for outbreaks).     [employer] PRENATAL GUMMY 0.4-113.5 MG Chew  Chew 2 each by mouth daily.     ibuprofen 600 MG tablet  Commonly known as:  ADVIL,MOTRIN  Take 1 tablet (600 mg total) by mouth every 6 (six) hours.     oxyCODONE-acetaminophen 10-325 MG tablet  Commonly known as:  PERCOCET  Take 1 tablet by mouth every 4 (four) hours as needed for pain.     pantoprazole 40 MG tablet  Commonly known as:  PROTONIX  Take 1 tablet (40 mg total) by mouth daily.        Diet: routine diet  Activity: Advance as tolerated. Pelvic rest for 6 weeks.   Outpatient follow up: In 1 week for nursing visit to have Hemoglobin checked, and in 6 weeks for postpartum visit. Follow up Appt:No future appointments. Follow up visit: No Follow-up on file.  Postpartum contraception: Undecided  Newborn Data:   Verlan FriendsBarley, GirlA Shakeia [[id number]][[id number]]  Live born female  Birth Weight: 4 lb 6.6 oz (2000 g) APGAR: 8, 9   Audelia HivesBarley, BoyB Salwa [[id number]][[id number]]  Live born female  Birth Weight: 5 lb 0.1 oz (2270 g) APGAR: 8, 9  Baby Feeding: Bottle Disposition:rooming in   01/03/2016 Willadean CarolKaty Mayo MD  OB fellow attestation I have seen and examined this patient and agree with above documentation in the resident's note.   Michaelene SongRachel L Terlecki is a 25 y.o. [id number] s/p LTCS.   Pain is well controlled.  Plan for birth control is undecided.  Method of Feeding: bottle  PE:  BP  134/96 mmHg  Pulse 93  Temp(Src) 98.5 F (36.9 C) (Oral)  Resp 18  Ht 5\' 1"  (1.549 m)  Wt 141 lb 6.4 oz (64.139 kg)  BMI 26.73 kg/m2  SpO2  99%  LMP 03/30/2015  Breastfeeding? Unknown Gen: well appearing Heart: reg rate Lungs: normal WOB Fundus firm Ext: soft, no pain, no edema Incision: clean/dry/intact   Recent Labs  01/02/16 1934 01/03/16 0506  HGB 8.7* 8.5*  HCT 26.5* 25.3*   Plan: discharge today - postpartum care discussed - f/u clinic in 6 weeks for postpartum visit - Follow up HH in clinic  Federico Flake, MD 9:45 PM Attestation of Attending Supervision of Fellow: Evaluation and management procedures were performed by the Fellow under my supervision and collaboration.  I have reviewed the Fellow's note and chart, and I agree with the management and plan.  Emelia Sandoval L. Harraway-Smith, M.D., [facility]

## 2016-01-03 NOTE — Progress Notes (Signed)
Called to room by Emergency call light and found mom crying in bed with twins crying in their cribs. Mom didn't answer when asked if everything was all right.. Continued to cry. Without knowing history, offered to take babies to the nursery so she could get some rest.  Advised by nursery night shift that mom has had them in nursery off and on since delivery but was told she needed to keep them in the room with her.  RN Nila NephewKristen Morgan had mom rate her pain (9) and let moms RN know.

## 2016-01-07 ENCOUNTER — Encounter (HOSPITAL_COMMUNITY): Payer: Self-pay | Admitting: *Deleted

## 2016-01-08 ENCOUNTER — Emergency Department (HOSPITAL_COMMUNITY): Payer: Medicaid Other

## 2016-01-08 ENCOUNTER — Encounter (HOSPITAL_COMMUNITY): Payer: Self-pay | Admitting: Emergency Medicine

## 2016-01-08 ENCOUNTER — Ambulatory Visit (HOSPITAL_COMMUNITY): Admit: 2016-01-08 | Discharge: 2016-01-08 | Disposition: A | Discharge status: Home / Self Care | Payer: Medicaid Other

## 2016-01-08 ENCOUNTER — Inpatient Hospital Stay (HOSPITAL_COMMUNITY)
Admission: EM | Admit: 2016-01-08 | Discharge: 2016-01-13 | DRG: 776 | Disposition: A | Discharge status: Home / Self Care | Payer: Medicaid Other | Attending: Internal Medicine | Admitting: Internal Medicine

## 2016-01-08 DIAGNOSIS — Z791 Long term (current) use of non-steroidal anti-inflammatories (NSAID): Secondary | ICD-10-CM

## 2016-01-08 DIAGNOSIS — K219 Gastro-esophageal reflux disease without esophagitis: Secondary | ICD-10-CM | POA: Diagnosis present

## 2016-01-08 DIAGNOSIS — R0902 Hypoxemia: Secondary | ICD-10-CM | POA: Diagnosis present

## 2016-01-08 DIAGNOSIS — Z98891 History of uterine scar from previous surgery: Secondary | ICD-10-CM | POA: Diagnosis not present

## 2016-01-08 DIAGNOSIS — R6510 Systemic inflammatory response syndrome (SIRS) of non-infectious origin without acute organ dysfunction: Secondary | ICD-10-CM | POA: Diagnosis not present

## 2016-01-08 DIAGNOSIS — Z885 Allergy status to narcotic agent status: Secondary | ICD-10-CM | POA: Diagnosis not present

## 2016-01-08 DIAGNOSIS — S37892A Contusion of other urinary and pelvic organ, initial encounter: Secondary | ICD-10-CM | POA: Diagnosis present

## 2016-01-08 DIAGNOSIS — M7989 Other specified soft tissue disorders: Secondary | ICD-10-CM | POA: Diagnosis not present

## 2016-01-08 DIAGNOSIS — Z818 Family history of other mental and behavioral disorders: Secondary | ICD-10-CM

## 2016-01-08 DIAGNOSIS — R600 Localized edema: Secondary | ICD-10-CM | POA: Diagnosis not present

## 2016-01-08 DIAGNOSIS — Z79899 Other long term (current) drug therapy: Secondary | ICD-10-CM

## 2016-01-08 DIAGNOSIS — Z87440 Personal history of urinary (tract) infections: Secondary | ICD-10-CM

## 2016-01-08 DIAGNOSIS — D62 Acute posthemorrhagic anemia: Secondary | ICD-10-CM | POA: Diagnosis present

## 2016-01-08 DIAGNOSIS — F329 Major depressive disorder, single episode, unspecified: Secondary | ICD-10-CM | POA: Diagnosis present

## 2016-01-08 DIAGNOSIS — Y838 Other surgical procedures as the cause of abnormal reaction of the patient, or of later complication, without mention of misadventure at the time of the procedure: Secondary | ICD-10-CM | POA: Diagnosis present

## 2016-01-08 DIAGNOSIS — S301XXA Contusion of abdominal wall, initial encounter: Secondary | ICD-10-CM | POA: Diagnosis present

## 2016-01-08 DIAGNOSIS — Z801 Family history of malignant neoplasm of trachea, bronchus and lung: Secondary | ICD-10-CM | POA: Diagnosis not present

## 2016-01-08 DIAGNOSIS — Z8249 Family history of ischemic heart disease and other diseases of the circulatory system: Secondary | ICD-10-CM | POA: Diagnosis not present

## 2016-01-08 DIAGNOSIS — R7989 Other specified abnormal findings of blood chemistry: Secondary | ICD-10-CM | POA: Diagnosis present

## 2016-01-08 DIAGNOSIS — F17210 Nicotine dependence, cigarettes, uncomplicated: Secondary | ICD-10-CM | POA: Diagnosis present

## 2016-01-08 DIAGNOSIS — O165 Unspecified maternal hypertension, complicating the puerperium: Secondary | ICD-10-CM | POA: Diagnosis present

## 2016-01-08 DIAGNOSIS — R000 Tachycardia, unspecified: Secondary | ICD-10-CM | POA: Diagnosis present

## 2016-01-08 DIAGNOSIS — O9089 Other complications of the puerperium, not elsewhere classified: Principal | ICD-10-CM | POA: Diagnosis present

## 2016-01-08 DIAGNOSIS — A419 Sepsis, unspecified organism: Secondary | ICD-10-CM | POA: Diagnosis present

## 2016-01-08 DIAGNOSIS — R140 Abdominal distension (gaseous): Secondary | ICD-10-CM

## 2016-01-08 DIAGNOSIS — E876 Hypokalemia: Secondary | ICD-10-CM | POA: Diagnosis present

## 2016-01-08 DIAGNOSIS — R1030 Lower abdominal pain, unspecified: Secondary | ICD-10-CM

## 2016-01-08 LAB — CORTISOL: CORTISOL PLASMA: 13.3 ug/dL

## 2016-01-08 LAB — URINALYSIS, ROUTINE W REFLEX MICROSCOPIC
BILIRUBIN URINE: NEGATIVE
GLUCOSE, UA: NEGATIVE mg/dL
KETONES UR: NEGATIVE mg/dL
Nitrite: NEGATIVE
PROTEIN: NEGATIVE mg/dL
Specific Gravity, Urine: 1.011 (ref 1.005–1.030)
pH: 7.5 (ref 5.0–8.0)

## 2016-01-08 LAB — DIC (DISSEMINATED INTRAVASCULAR COAGULATION) PANEL
APTT: 33 s (ref 24–37)
D DIMER QUANT: 3.71 ug{FEU}/mL — AB (ref 0.00–0.50)
INR: 0.93 (ref 0.00–1.49)
PLATELETS: 327 10*3/uL (ref 150–400)
SMEAR REVIEW: NONE SEEN

## 2016-01-08 LAB — CBC
HEMATOCRIT: 26.2 % — AB (ref 36.0–46.0)
HEMOGLOBIN: 8.6 g/dL — AB (ref 12.0–15.0)
MCH: 26.8 pg (ref 26.0–34.0)
MCHC: 32.8 g/dL (ref 30.0–36.0)
MCV: 81.6 fL (ref 78.0–100.0)
PLATELETS: 330 10*3/uL (ref 150–400)
RBC: 3.21 MIL/uL — ABNORMAL LOW (ref 3.87–5.11)
RDW: 18.4 % — ABNORMAL HIGH (ref 11.5–15.5)
WBC: 11.9 10*3/uL — AB (ref 4.0–10.5)

## 2016-01-08 LAB — COMPREHENSIVE METABOLIC PANEL
ALT: 39 U/L (ref 14–54)
AST: 61 U/L — ABNORMAL HIGH (ref 15–41)
Albumin: 2 g/dL — ABNORMAL LOW (ref 3.5–5.0)
Alkaline Phosphatase: 136 U/L — ABNORMAL HIGH (ref 38–126)
Anion gap: 9 (ref 5–15)
BUN: 9 mg/dL (ref 6–20)
CHLORIDE: 109 mmol/L (ref 101–111)
CO2: 22 mmol/L (ref 22–32)
CREATININE: 0.42 mg/dL — AB (ref 0.44–1.00)
Calcium: 8.4 mg/dL — ABNORMAL LOW (ref 8.9–10.3)
Glucose, Bld: 117 mg/dL — ABNORMAL HIGH (ref 65–99)
POTASSIUM: 3.4 mmol/L — AB (ref 3.5–5.1)
SODIUM: 140 mmol/L (ref 135–145)
Total Bilirubin: 1 mg/dL (ref 0.3–1.2)
Total Protein: 5.3 g/dL — ABNORMAL LOW (ref 6.5–8.1)

## 2016-01-08 LAB — AMYLASE: AMYLASE: 15 U/L — AB (ref 28–100)

## 2016-01-08 LAB — DIC (DISSEMINATED INTRAVASCULAR COAGULATION)PANEL
Fibrinogen: 538 mg/dL — ABNORMAL HIGH (ref 204–475)
Prothrombin Time: 12.7 seconds (ref 11.6–15.2)

## 2016-01-08 LAB — BRAIN NATRIURETIC PEPTIDE: B NATRIURETIC PEPTIDE 5: 52.7 pg/mL (ref 0.0–100.0)

## 2016-01-08 LAB — DIFFERENTIAL
Basophils Absolute: 0 10*3/uL (ref 0.0–0.1)
Basophils Relative: 0 %
EOS ABS: 0 10*3/uL (ref 0.0–0.7)
EOS PCT: 0 %
LYMPHS ABS: 2 10*3/uL (ref 0.7–4.0)
Lymphocytes Relative: 17 %
MONO ABS: 0.7 10*3/uL (ref 0.1–1.0)
MONOS PCT: 6 %
NEUTROS PCT: 77 %
Neutro Abs: 9.2 10*3/uL — ABNORMAL HIGH (ref 1.7–7.7)

## 2016-01-08 LAB — URINE MICROSCOPIC-ADD ON

## 2016-01-08 LAB — I-STAT CG4 LACTIC ACID, ED
LACTIC ACID, VENOUS: 1.53 mmol/L (ref 0.5–2.0)
Lactic Acid, Venous: 2.83 mmol/L (ref 0.5–2.0)

## 2016-01-08 LAB — LIPASE, BLOOD: LIPASE: 16 U/L (ref 11–51)

## 2016-01-08 LAB — LACTIC ACID, PLASMA: Lactic Acid, Venous: 1.7 mmol/L (ref 0.5–2.0)

## 2016-01-08 LAB — PREPARE RBC (CROSSMATCH)

## 2016-01-08 LAB — TROPONIN I

## 2016-01-08 LAB — PROCALCITONIN: Procalcitonin: <0.1 ng/mL

## 2016-01-08 LAB — MRSA PCR SCREENING: MRSA by PCR: POSITIVE — AB

## 2016-01-08 MED ORDER — SODIUM CHLORIDE 0.9 % IV SOLN
INTRAVENOUS | Status: DC
  Administered 2016-01-08: 21:00:00 via INTRAVENOUS

## 2016-01-08 MED ORDER — SODIUM CHLORIDE 0.9 % IV BOLUS (SEPSIS): 30.0000 mL/kg | Freq: Once | INTRAVENOUS | Status: DC

## 2016-01-08 MED ORDER — MORPHINE SULFATE (PF) 2 MG/ML IV SOLN
2.0000 mg | INTRAVENOUS | Status: DC | PRN
  Administered 2016-01-08: 2 mg via INTRAVENOUS
  Filled 2016-01-08: qty 1

## 2016-01-08 MED ORDER — FUROSEMIDE 20 MG PO TABS
20.0000 mg | ORAL_TABLET | Freq: Once | ORAL | Status: AC
  Administered 2016-01-09: 20 mg via ORAL
  Filled 2016-01-08: qty 1

## 2016-01-08 MED ORDER — ONDANSETRON HCL 4 MG/2ML IJ SOLN
4.0000 mg | Freq: Once | INTRAMUSCULAR | Status: AC
  Administered 2016-01-08: 4 mg via INTRAVENOUS
  Filled 2016-01-08: qty 2

## 2016-01-08 MED ORDER — ONDANSETRON HCL 4 MG/2ML IJ SOLN: 4.0000 mg | Freq: Four times a day (QID) | INTRAMUSCULAR | Status: DC | PRN

## 2016-01-08 MED ORDER — SODIUM CHLORIDE 0.9 % IV BOLUS (SEPSIS): 1000.0000 mL | Freq: Once | INTRAVENOUS | Status: DC

## 2016-01-08 MED ORDER — ONDANSETRON HCL 4 MG/2ML IJ SOLN: 4.0000 mg | Freq: Three times a day (TID) | INTRAMUSCULAR | Status: DC | PRN

## 2016-01-08 MED ORDER — MORPHINE SULFATE (PF) 4 MG/ML IV SOLN
4.0000 mg | Freq: Once | INTRAVENOUS | Status: AC
  Administered 2016-01-08: 4 mg via INTRAVENOUS
  Filled 2016-01-08: qty 1

## 2016-01-08 MED ORDER — VANCOMYCIN HCL IN DEXTROSE 1-5 GM/200ML-% IV SOLN
1000.0000 mg | Freq: Once | INTRAVENOUS | Status: AC
  Administered 2016-01-08: 1000 mg via INTRAVENOUS
  Filled 2016-01-08: qty 200

## 2016-01-08 MED ORDER — POLYETHYLENE GLYCOL 3350 17 G PO PACK
17.0000 g | PACK | Freq: Every day | ORAL | Status: DC
  Administered 2016-01-09 – 2016-01-10 (×2): 17 g via ORAL
  Filled 2016-01-08 (×3): qty 1

## 2016-01-08 MED ORDER — SODIUM CHLORIDE 0.9 % IV SOLN
Freq: Once | INTRAVENOUS | Status: AC
  Administered 2016-01-08: 22:00:00 via INTRAVENOUS

## 2016-01-08 MED ORDER — PIPERACILLIN-TAZOBACTAM 3.375 G IVPB 30 MIN
3.3750 g | INTRAVENOUS | Status: AC
Start: 2016-01-08 — End: 2016-01-08
  Administered 2016-01-08: 3.375 g via INTRAVENOUS
  Filled 2016-01-08: qty 50

## 2016-01-08 MED ORDER — SODIUM CHLORIDE 0.9 % IV SOLN
Freq: Once | INTRAVENOUS | Status: AC
  Administered 2016-01-08: 17:00:00 via INTRAVENOUS

## 2016-01-08 MED ORDER — IOPAMIDOL (ISOVUE-370) INJECTION 76%
100.0000 mL | Freq: Once | INTRAVENOUS | Status: AC | PRN
  Administered 2016-01-08: 100 mL via INTRAVENOUS

## 2016-01-08 MED ORDER — MAGNESIUM SULFATE 2 GM/50ML IV SOLN
2.0000 g | Freq: Once | INTRAVENOUS | Status: AC
  Administered 2016-01-08: 2 g via INTRAVENOUS
  Filled 2016-01-08: qty 50

## 2016-01-08 MED ORDER — SODIUM CHLORIDE 0.9 % IV BOLUS (SEPSIS)
1000.0000 mL | INTRAVENOUS | Status: DC
  Administered 2016-01-08: 1000 mL via INTRAVENOUS

## 2016-01-08 MED ORDER — MORPHINE SULFATE (PF) 2 MG/ML IV SOLN
2.0000 mg | INTRAVENOUS | Status: DC | PRN
  Administered 2016-01-08 – 2016-01-09 (×8): 2 mg via INTRAVENOUS
  Administered 2016-01-09: 4 mg via INTRAVENOUS
  Administered 2016-01-09 (×2): 2 mg via INTRAVENOUS
  Filled 2016-01-08 (×10): qty 1
  Filled 2016-01-08: qty 2

## 2016-01-08 MED ORDER — VANCOMYCIN HCL IN DEXTROSE 750-5 MG/150ML-% IV SOLN
750.0000 mg | Freq: Three times a day (TID) | INTRAVENOUS | Status: DC
  Administered 2016-01-09 – 2016-01-10 (×4): 750 mg via INTRAVENOUS
  Filled 2016-01-08 (×5): qty 150

## 2016-01-08 MED ORDER — IOPAMIDOL (ISOVUE-300) INJECTION 61%: 100.0000 mL | Freq: Once | INTRAVENOUS | Status: DC | PRN

## 2016-01-08 MED ORDER — PANTOPRAZOLE SODIUM 40 MG IV SOLR
40.0000 mg | INTRAVENOUS | Status: DC
  Administered 2016-01-08 – 2016-01-10 (×3): 40 mg via INTRAVENOUS
  Filled 2016-01-08 (×3): qty 40

## 2016-01-08 MED ORDER — SODIUM CHLORIDE 0.9 % IV SOLN: 250.0000 mL | INTRAVENOUS | Status: DC | PRN

## 2016-01-08 MED ORDER — SODIUM CHLORIDE 0.9 % IV SOLN: INTRAVENOUS | Status: DC

## 2016-01-08 MED ORDER — PIPERACILLIN-TAZOBACTAM 3.375 G IVPB
3.3750 g | Freq: Three times a day (TID) | INTRAVENOUS | Status: DC
  Administered 2016-01-09 – 2016-01-12 (×10): 3.375 g via INTRAVENOUS
  Filled 2016-01-08 (×10): qty 50

## 2016-01-08 NOTE — ED Notes (Signed)
Intensivist at bedside.

## 2016-01-08 NOTE — ED Notes (Signed)
Pt can go to unit in 20 min

## 2016-01-08 NOTE — ED Notes (Signed)
Per pt, states c-section with twins 8 days ago-states was given 5 pints of blood-states drainage from incision site and B/L LE edema

## 2016-01-08 NOTE — ED Provider Notes (Signed)
CSN: [id number]     Arrival date & time 01/08/16  1438 History   None    Chief Complaint  Patient presents with  . Post-op Problem     (Consider location/radiation/quality/duration/timing/severity/associated sxs/prior Treatment) HPI   April [name] is a 25 y.o. female, with a history of recurrent UTI and pregnancy, presenting to the ED with bilateral leg edema and lower abdominal pain for the last week. Pt had a c-section 8 days ago, delivering twins at [facility] hospital, at [redacted]w[redacted]d. Given 5 units of blood due to bleeding during surgery. Pt was discharged and states she was given no instructions. Pt also endorses swelling, distention, firmness, and pain in her lower abdomen, worse on the right, as well as burning with urination and frequency. Pt had one episode of vomiting yesterday. Pt rates the lower abdominal pain at 10/10, sharp, nonradiating. Pt states the vaginal discharge she has been experiencing seems to be normal. Pt states she is still having normal BMs and is passing gas. Denies fever/chills, current nausea, hematuria, cough, shortness of breath, blood in the stool, or any other complaints. Pt is G4P3. Not currently breast feeding. Patient's surgeon was Dr. Jolayne Panther.    Past Medical History  Diagnosis Date  . Depression   . GERD (gastroesophageal reflux disease)   . UTI (urinary tract infection) in pregnancy in first trimester   . Herpes   . Infection     UTI   Past Surgical History  Procedure Laterality Date  . Dilation and curettage of uterus    . Cesarean section N/A 12/31/2015    Procedure: CESAREAN SECTION;  Surgeon: Catalina Antigua, MD;  Location: [facility];  Service: Obstetrics;  Laterality: N/A;   Family History  Problem Relation Age of Onset  . Hypertension Maternal Grandmother   . Depression Maternal Grandmother   . Heart disease Maternal Grandmother   . Cancer Maternal Grandfather     lung   Social History  Substance Use Topics  . Smoking status: Current Every  Day Smoker -- 0.25 packs/day for 3 years    Types: Cigarettes    Start date: 04/29/2007  . Smokeless tobacco: Never Used     Comment: Declines help  . Alcohol Use: No   OB History    Gravida Para Term Preterm AB TAB SAB Ectopic Multiple Living   Review of Systems  Constitutional: Positive for fatigue. Negative for fever, chills and diaphoresis.  Eyes: Negative for visual disturbance.  Respiratory: Negative for cough and shortness of breath.   Cardiovascular: Negative for chest pain.  Gastrointestinal: Positive for nausea (resolved), vomiting (resolved) and abdominal pain.  Genitourinary: Positive for dysuria and pelvic pain.  Skin: Positive for color change and pallor.  Neurological: Positive for weakness and headaches. Negative for dizziness, syncope and light-headedness.  All other systems reviewed and are negative.   Allergies  Codeine  Home Medications   Prior to Admission medications   Medication Sig Start Date End Date Taking? Authorizing Provider  acyclovir (ZOVIRAX) 200 MG capsule Take 400 mg by mouth 3 (three) times daily as needed (for outbreaks).   Yes Historical Provider, MD  ibuprofen (ADVIL,MOTRIN) 600 MG tablet Take 1 tablet (600 mg total) by mouth every 6 (six) hours. 01/03/16  Yes Campbell Stall, MD  oxyCODONE-acetaminophen (PERCOCET) 10-325 MG tablet Take 1 tablet by mouth every 4 (four) hours as needed for pain. 01/03/16  Yes Campbell Stall, MD  pantoprazole (PROTONIX) 40 MG tablet Take 1 tablet (40 mg total) by mouth daily. Patient taking differently: Take 40 mg by mouth daily as needed (reflux).  12/06/15  Yes Adam Phenix, MD  Prenatal Vit-Min-FA-Fish Oil ([employer] PRENATAL GUMMY) 0.4-113.5 MG CHEW Chew 2 each by mouth daily.   Yes Historical Provider, MD   BP 148/89 mmHg  Pulse 95  Temp(Src) 99.3 F (37.4 C) (Oral)  Resp 18  Ht 5\' 1"  (1.549 m)  Wt 63.504 kg  BMI 26.47 kg/m2  SpO2 100%  Physical Exam  Constitutional: She is oriented  to person, place, and time. She appears well-developed and well-nourished. No distress.  HENT:  Head: Normocephalic and atraumatic.  Eyes: Conjunctivae are normal. Pupils are equal, round, and reactive to light.  Neck: Neck supple.  Cardiovascular: Regular rhythm, normal heart sounds and intact distal pulses.  Tachycardia present.   Pulmonary/Chest: Effort normal and breath sounds normal. No respiratory distress.  Abdominal: Soft. Bowel sounds are normal. There is tenderness in the right lower quadrant, suprapubic area and left lower quadrant. There is no guarding.  Pt has lower abdominal distention, bruising, tenderness, and firmness. C-section scar seems to be intact without signs of dehiscence. No swelling locally, discharge, or erythema.  Musculoskeletal: She exhibits edema and tenderness.  Bilateral lower extremity pitting edema. Right leg is more swollen than the left. Swelling extends into the patient's thigh. Patient also noted to have erythema and skin discoloration spread across her right leg, in an almost petechial pattern.  Lymphadenopathy:    She has no cervical adenopathy.       Right: No inguinal adenopathy present.       Left: No inguinal adenopathy present.  Neurological: She is alert and oriented to person, place, and time. She has normal reflexes.  No sensory deficits. Strength 5/5.  Skin: Skin is warm and dry. She is not diaphoretic. There is pallor.  Psychiatric: She has a normal mood and affect. Her behavior is normal.  Nursing note and vitals reviewed.   ED Course  Procedures (including critical care time)  CRITICAL CARE Performed by: Kedrick Mcnamee C Yeriel Mineo Total critical care time: 60 minutes Critical care time was exclusive of separately billable procedures and treating other patients. Critical care was necessary to treat or prevent imminent or life-threatening deterioration. Critical care was time spent personally by me on the following activities: development of  treatment plan with patient and/or surrogate as well as nursing, discussions with consultants, evaluation of patient's response to treatment, examination of patient, obtaining history from patient or surrogate, ordering and performing treatments and interventions, ordering and review of laboratory studies, ordering and review of radiographic studies, pulse oximetry and re-evaluation of patient's condition.  Labs Review Labs Reviewed  COMPREHENSIVE METABOLIC PANEL - Abnormal; Notable for the following:    Potassium 3.4 (*)    Glucose, Bld 117 (*)    Creatinine, Ser 0.42 (*)    Calcium 8.4 (*)    Total Protein 5.3 (*)    Albumin 2.0 (*)    AST 61 (*)    Alkaline Phosphatase 136 (*)    All other components within normal limits  CBC - Abnormal; Notable for the following:    WBC 11.9 (*)    RBC 3.21 (*)    Hemoglobin 8.6 (*)    HCT 26.2 (*)    RDW 18.4 (*)    All other components within normal limits  DIFFERENTIAL - Abnormal; Notable for the following:    Neutro Abs 9.2 (*)  All other components within normal limits  DIC (DISSEMINATED INTRAVASCULAR COAGULATION) PANEL - Abnormal; Notable for the following:    Fibrinogen 538 (*)    D-Dimer, Quant 3.71 (*)    All other components within normal limits  I-STAT CG4 LACTIC ACID, ED - Abnormal; Notable for the following:    Lactic Acid, Venous 2.83 (*)    All other components within normal limits  URINE CULTURE  CULTURE, BLOOD (ROUTINE X 2)  CULTURE, BLOOD (ROUTINE X 2)  BRAIN NATRIURETIC PEPTIDE  URINALYSIS, ROUTINE W REFLEX MICROSCOPIC (NOT AT [facility])  AMYLASE  LIPASE, BLOOD  TROPONIN I  TROPONIN I  TROPONIN I  LACTIC ACID, PLASMA  PROCALCITONIN  CORTISOL  CBC  BASIC METABOLIC PANEL  MAGNESIUM  PHOSPHORUS  I-STAT CG4 LACTIC ACID, ED  I-STAT CG4 LACTIC ACID, ED  TYPE AND SCREEN  PREPARE RBC (CROSSMATCH)    Imaging Review Ct Angio Chest Pe W/cm &/or Wo Cm  01/08/2016  CLINICAL DATA:  Status post Cesarean section with wound  drainage and leg swelling EXAM: CT ANGIOGRAPHY CHEST CT ABDOMEN AND PELVIS WITH CONTRAST TECHNIQUE: Multidetector CT imaging of the chest was performed using the standard protocol during bolus administration of intravenous contrast. Multiplanar CT image reconstructions and MIPs were obtained to evaluate the vascular anatomy. Multidetector CT imaging of the abdomen and pelvis was performed using the standard protocol during bolus administration of intravenous contrast. CONTRAST:  100 mL Isovue 370. COMPARISON:  01/02/2016 FINDINGS: CTA CHEST FINDINGS The lungs are well aerated bilaterally. Some minimal dependent atelectatic changes are seen. No focal infiltrate or sizable effusion is noted. The thoracic inlet and thoracic aorta are unremarkable. The pulmonary artery is well visualized and demonstrates a normal branching pattern. No filling defects to suggest pulmonary emboli are identified. No significant hilar or mediastinal adenopathy is noted. The breasts are engorged consistent with the postpartum state. No acute bony abnormality is seen. CT ABDOMEN and PELVIS FINDINGS The gallbladder is contracted. The liver, spleen, adrenal glands and pancreas are within normal limits with the exception of a small cyst within the posterior aspect of the spleen. Kidneys the again demonstrates a left renal cyst. Fullness of the right renal collecting system and ureter is noted without definitive obstructive change. The bladder is partially decompressed by Foley catheter. Air is noted in the bladder consistent with the recent catheter placement. Minimal free pelvic fluid is noted as well as a small amount of free fluid adjacent to the liver tip. There are changes in the uterus consistent with the postpartum state and recent Cesarean section. Adjacent to the C-section line within the myometrium, there is a 9.1 by 7.6 by 10.7 cm mixed attenuation collection consistent with hematoma. It appears slightly larger than that seen on the  prior exam. Postsurgical changes in the anterior abdominal wall are seen. Additionally there is a fluid collection anterior to the abdominal musculature which measures 9.2 by 2.3 cm. No air is noted within an this may simply represent a postoperative hematoma. Clinical correlation with the wound drainage is recommended. This collection is new from the prior exam. The appendix is not well visualized. No other focal abnormality is seen. Review of the MIP images confirms the above findings. IMPRESSION: No evidence of pulmonary embolism. Persistent and slightly increased mixed attenuation hematoma adjacent to the Cesarean section scar within the uterus in the right pelvis New fluid collection anterior to the abdominal wall musculature which may simply represent a seroma or hematoma. It is new from the prior exam.  The possibility of an underlying abscess could not be totally excluded. Fullness of the right renal collecting system without definitive obstruction. This is likely related to localized mass effect secondary to the hematoma. Electronically Signed   By: Alcide CleverMark  Lukens M.D.   On: 01/08/2016 17:48   Ct Abdomen Pelvis W Contrast  01/08/2016  CLINICAL DATA:  Status post Cesarean section with wound drainage and leg swelling EXAM: CT ANGIOGRAPHY CHEST CT ABDOMEN AND PELVIS WITH CONTRAST TECHNIQUE: Multidetector CT imaging of the chest was performed using the standard protocol during bolus administration of intravenous contrast. Multiplanar CT image reconstructions and MIPs were obtained to evaluate the vascular anatomy. Multidetector CT imaging of the abdomen and pelvis was performed using the standard protocol during bolus administration of intravenous contrast. CONTRAST:  100 mL Isovue 370. COMPARISON:  01/02/2016 FINDINGS: CTA CHEST FINDINGS The lungs are well aerated bilaterally. Some minimal dependent atelectatic changes are seen. No focal infiltrate or sizable effusion is noted. The thoracic inlet and thoracic  aorta are unremarkable. The pulmonary artery is well visualized and demonstrates a normal branching pattern. No filling defects to suggest pulmonary emboli are identified. No significant hilar or mediastinal adenopathy is noted. The breasts are engorged consistent with the postpartum state. No acute bony abnormality is seen. CT ABDOMEN and PELVIS FINDINGS The gallbladder is contracted. The liver, spleen, adrenal glands and pancreas are within normal limits with the exception of a small cyst within the posterior aspect of the spleen. Kidneys the again demonstrates a left renal cyst. Fullness of the right renal collecting system and ureter is noted without definitive obstructive change. The bladder is partially decompressed by Foley catheter. Air is noted in the bladder consistent with the recent catheter placement. Minimal free pelvic fluid is noted as well as a small amount of free fluid adjacent to the liver tip. There are changes in the uterus consistent with the postpartum state and recent Cesarean section. Adjacent to the C-section line within the myometrium, there is a 9.1 by 7.6 by 10.7 cm mixed attenuation collection consistent with hematoma. It appears slightly larger than that seen on the prior exam. Postsurgical changes in the anterior abdominal wall are seen. Additionally there is a fluid collection anterior to the abdominal musculature which measures 9.2 by 2.3 cm. No air is noted within an this may simply represent a postoperative hematoma. Clinical correlation with the wound drainage is recommended. This collection is new from the prior exam. The appendix is not well visualized. No other focal abnormality is seen. Review of the MIP images confirms the above findings. IMPRESSION: No evidence of pulmonary embolism. Persistent and slightly increased mixed attenuation hematoma adjacent to the Cesarean section scar within the uterus in the right pelvis New fluid collection anterior to the abdominal wall  musculature which may simply represent a seroma or hematoma. It is new from the prior exam. The possibility of an underlying abscess could not be totally excluded. Fullness of the right renal collecting system without definitive obstruction. This is likely related to localized mass effect secondary to the hematoma. Electronically Signed   By: Alcide CleverMark  Lukens M.D.   On: 01/08/2016 17:48   Dg Chest Portable 1 View  01/08/2016  CLINICAL DATA:  Recent C-section with wound drainage EXAM: PORTABLE CHEST 1 VIEW COMPARISON:  02/19/2015 FINDINGS: The heart size and mediastinal contours are within normal limits. Both lungs are clear. The visualized skeletal structures are unremarkable. IMPRESSION: No active disease. Electronically Signed   By: Eulah PontMark  Lukens M.D.  On: 01/08/2016 16:54   I have personally reviewed and evaluated these images and lab results as part of my medical decision-making.   EKG Interpretation   Date/Time:  Tuesday January 08 2016 16:42:48 EDT Ventricular Rate:  113 PR Interval:  133 QRS Duration: 82 QT Interval:  312 QTC Calculation: 428 R Axis:   58 Text Interpretation:  Sinus tachycardia agree. O/W normal Confirmed by  Donnald Garre, MD, Lebron Conners [phone]) on 01/08/2016 4:49:07 PM       Medications  sodium chloride 0.9 % bolus 1,000 mL (1,000 mLs Intravenous New Bag/Given 01/08/16 1628)  piperacillin-tazobactam (ZOSYN) IVPB 3.375 g (not administered)  magnesium sulfate IVPB 2 g 50 mL (2 g Intravenous New Bag/Given 01/08/16 1738)  sodium chloride 0.9 % bolus 1,905 mL (not administered)  vancomycin (VANCOCIN) IVPB 1000 mg/200 mL premix (1,000 mg Intravenous New Bag/Given 01/08/16 1754)  0.9 %  sodium chloride infusion (not administered)  0.9 %  sodium chloride infusion (not administered)  morphine 2 MG/ML injection 2 mg (not administered)  pantoprazole (PROTONIX) injection 40 mg (not administered)  ondansetron (ZOFRAN) injection 4 mg (not administered)  ondansetron (ZOFRAN) injection 4 mg (4  mg Intravenous Given 01/08/16 1641)  morphine 4 MG/ML injection 4 mg (4 mg Intravenous Given 01/08/16 1641)  piperacillin-tazobactam (ZOSYN) IVPB 3.375 g (0 g Intravenous Stopped 01/08/16 1709)  0.9 %  sodium chloride infusion ( Intravenous New Bag/Given 01/08/16 1646)  iopamidol (ISOVUE-370) 76 % injection 100 mL (100 mLs Intravenous Contrast Given 01/08/16 1717)   Orders Placed This Encounter  Procedures  . Urine culture  . Culture, blood (routine x 2)  . DG Chest Portable 1 View  . CT Abdomen Pelvis W Contrast  . CT Angio Chest PE W/Cm &/Or Wo Cm  . Comprehensive metabolic panel  . CBC  . Urinalysis, Routine w reflex microscopic (not at [facility])  . Differential  . Brain natriuretic peptide  . DIC panel  . Amylase  . Lipase, blood  . Troponin I  . Lactic acid, plasma  . Procalcitonin  . Cortisol  . CBC  . Basic metabolic panel  . Magnesium  . Phosphorus  . Saline Lock IV, Maintain IV access  . Re-check Vital Signs  . Orthostatic vital signs  . Check Rectal Temperature  . Document vital signs within 1-hour of fluid bolus completion and notify provider of bolus completion  . Document Actual / Estimated Weight  . Insert peripheral IV x 2  . Practitioner attestation of consent  . Complete patient signature process for consent form  . H & H post transfustion-  RN to place lab order with appropriate draw time  . Insert temp foley  . Nursing communication  . Vital signs  . Cardiac monitoring  . Initiate eLink PCCM Adult ICU Electrolyte Replacement Protocol  . Weigh on admission and daily  . Strict intake and output  . ICU - CAM assessment  . Complete oral assessment and place order set for Oral Care Protocol  . Practitioner attestation of consent  . Complete patient signature process for consent form  . Full code  . Call Code Sepsis([facility] [phone]) Reason for Consult?: tracking  . piperacillin-tazobactam (ZOSYN) per pharmacy consult  . Consult to intensivist  . Consult  to obstetrics / gynecology  . CCM pharmacy monitoring  . antibiotic (miscellaneous) per pharmacy consult  . Pulse oximetry, continuous  . Oxygen therapy Mode or (Route): Nasal cannula; Liters Per Minute: 2  . Pulse oximetry, continuous  . I-Stat CG4 Lactic  Acid, ED  . I-Stat CG4 Lactic Acid, ED  (not at  [facility])  . ED EKG  . EKG 12-Lead  . ECHO COMPLETE  . Type and screen [facility]  . Prepare RBC  . Admit to Inpatient (patient's expected length of stay will be greater than 2 midnights or inpatient only procedure)  . Admit to Inpatient (patient's expected length of stay will be greater than 2 midnights or inpatient only procedure)  . Admit to Inpatient (patient's expected length of stay will be greater than 2 midnights or inpatient only procedure)    MDM   Final diagnoses:  Sepsis, due to unspecified organism [facility])  Lower abdominal pain  Abdominal distention  Lower leg edema    Troi L Seder presents with lower abdominal pain, distention, and firmness combined with bilateral lower extremity edema that began less than a week ago.  Findings and plan of care discussed with Arby Barrette, MD. Dr. Donnald Garre personally evaluated and examined this patient.  I suspect that this patient may be septic, based on her tachycardia, recent surgery, physical exam, and increased lactic acid. Sepsis. Protocol utilized with fluid boluses, appropriate labs, code sepsis, as well as vancomycin and Zosyn. Although the patient seems to have a history of anemia, She may be newly symptomatic to it due to the possible sepsis. Due to the lower extremity discoloration, asymmetrical swelling, and the patient's presentation suspect that the patient may have lower extremity DVT versus cellulitis. Duplex ultrasound ordered to assess this issue. An abdominal CT was also ordered to assess the patient's abdomen. Low suspicion of HELLP due to normal platelets and LFTs that are not quite high enough to  warrant this suspicion. Patient may also be suffering from postnatal pre-eclampsia. Magnesium infusion ordered. 4:14 PM Spoke with Dr. Isaiah Serge, Intensivist, Who agreed to see the patient. States that he will be coming from [facility]. Patient admitted to ICU. Intensivist to see the patient prior to her transfer to the ICU.  5:13 PM Dr. Isaiah Serge at the bedside to assess patient and transfer her to ICU. Requested OBGYN consult.  5:17 PM Spoke with Dr. Adrian Blackwater, OBGYN, who agreed to come consult on the patient. No further instructions.  Patient was transferred to the ICU prior to the return of CT results.  Filed Vitals:   01/08/16 1459 01/08/16 1634 01/08/16 1648  BP: 137/100 145/94   Pulse: 117 90   Temp: 98.8 F (37.1 C)    TempSrc: Oral    Resp: 18    Height:    (1.549 m)  Weight:   63.504 kg  SpO2: 99% 100%    Filed Vitals:   01/08/16 1710 01/08/16 1715 01/08/16 1730 01/08/16 1740  BP: 135/94 136/92  148/89  Pulse: 88  96 95  Temp: 98.8 F (37.1 C)  99.3 F (37.4 C) 99.3 F (37.4 C)  TempSrc:      Resp: Height:      Weight:      SpO2: 100%  100% 100%     Anselm Pancoast, PA-C 01/08/16 1815  Arby Barrette, MD 01/10/16 1904

## 2016-01-08 NOTE — Consult Note (Addendum)
Reason for Consult:Readmission for postoperative Pelvic hematoma, generalized abdominal and lower extremity edema, small subcutaneous hematoma  Referring Physician: Marshell Garfinkel, MD  April [name] is an 25 y.o. female who is now 8 days s/p cesarean section 12/31/15 for twin gestation at [redacted]w[redacted]d due to fetal malpresentation and ruptured membranes., with EBL at low transverse cesarean estimated at 700 cc.(wnl). Postoperative care was complicated by anemia and increased pain complaints, with identification of an abdominal intraperitoneal hematoma in front of uterus, requiring a total of 5 units PRBC's over first 2 postop days. She was discharge late on POD 3. Of significance the patient was in poor overall nutritional status going in to surgery, having not taken Iron at all during the pregnancy, and only partial compliance with gummy prenatal vits, despite progressive decline of HGB from 12.3 in Sept, 10.0 in November, 8.1 on 3/1, and 7.6 preop on 4/2. She was not given Feraheme during pregnancy. She reports that the lower extremity edema was significant [place] Evaluation to date shows negative CTA , no evidence of Pulmonary Embolus, CT abdomen showing a slightly larger, more defined  intraabdominal hematoma 10 cm max diameter located anterior to uterus, above the bladder, with some small amount of free fluid in abdomen. There is no suggestion of GI or GU injury. Lower extremity edema has progressed to include some light bruising down to the knee, R>L, which i attribute to retrograde lymphatic spread of blood, and the anterior abdomen is bruised with a small thin 9 cm x 2 cm area of fluid consistent with blood dispersal/ smaller hematoma. Venous doppler has shown no evidence of DVT. The patient has been having bowel movements, is hungry, requesting food, and is not breast feeding, so diuretics will be fine when  indicated. Currently the pt has completed 2 liters if fluids as part of sepsis protocol due to initial elevated lactate level, but has not been hypotensive and pulse is normal so will d/c fluid boluses and resume KVO iv fluids. Discussed with Critical Care.     Pertinent OB/Gynecological History: Menses: postpartum Bleeding: normal lochia Contraception:  DES exposure:  Blood transfusions: 5 units on 4/3 and 4/5 total Sexually transmitted diseases: no past history Previous GYN Procedures: cesarean 4/3 1 am  Last mammogram:  Date:  Last pap:  Date:  OB History: G4, P1122   Menstrual History: Menarche age:  No LMP recorded.    Past Medical History  Diagnosis Date  . Depression   . GERD (gastroesophageal reflux disease)   . UTI (urinary tract infection) in pregnancy in first trimester   . Herpes   . Infection     UTI    Past Surgical History  Procedure Laterality Date  . Dilation and curettage of uterus    . Cesarean section N/A 12/31/2015    Procedure: CESAREAN SECTION;  Surgeon: PMora Bellman MD;  [facility];  Service: Obstetrics;  Laterality: N/A;    Family History  Problem Relation Age of Onset  . Hypertension Maternal Grandmother   . Depression Maternal Grandmother   . Heart disease Maternal Grandmother   . Cancer Maternal Grandfather     lung    Social History:  reports that she has been smoking Cigarettes.  She started smoking about 8 years ago. She has a .75 pack-year smoking history. She has never used smokeless tobacco. She reports that she does not drink alcohol or use illicit drugs.  Allergies:  Allergies  Allergen Reactions  . Codeine Nausea And Vomiting    Specifically tylenol 3    Medications: I have reviewed the patient's current medications.  Review of Systems  Constitutional: Negative for fever and chills.  Respiratory: Negative.   Cardiovascular: Negative.   Gastrointestinal: Negative for nausea and vomiting.       Hungry, had bm  yesterday  Genitourinary: Positive for urgency.       Pressure and burning at end of voiding effort., likely pressure effect  Skin:       Marked  4+ edema of lower extremities and some 2+vulvar swelling, with periorbital edema as well.    Blood pressure 162/89, pulse 82, temperature 99.5 F (37.5 C), temperature source Oral, resp. rate 21, height _0  (1.549 m), weight 152 lb 8.9 oz (69.2 kg), SpO2 100 %, unknown if currently breastfeeding. Physical Exam  Constitutional: She is oriented to person, place, and time.  Pale caucasian female alert oriented , generalized edema  HENT:  Head: Normocephalic and atraumatic.  orbital edema  Eyes: Pupils are equal, round, and reactive to light.  Cardiovascular: Normal rate and regular rhythm.   Respiratory: Effort normal.  GI: Soft. Bowel sounds are normal.  Bs mainly in upper abdomen Incision intact, with bruising and edema beginning 2 cm above the incision   Musculoskeletal: She exhibits edema.  Neurological: She is alert and oriented to person, place, and time.  Skin: Skin is warm and dry. There is pallor.  Psychiatric: She has a normal mood and affect. Her behavior is normal. Thought content normal.    Results for orders placed or performed during the hospital encounter of 01/08/16 (from the past 48 hour(s))  Comprehensive metabolic panel     Status: Abnormal   Collection Time: 01/08/16  3:19 PM  Result Value Ref Range   Sodium 140 135 - 145 mmol/L   Potassium 3.4 (L) 3.5 - 5.1 mmol/L   Chloride 109 101 - 111 mmol/L   CO2 22 22 - 32 mmol/L   Glucose, Bld 117 (H) 65 - 99 mg/dL   BUN 9 6 - 20 mg/dL   Creatinine, Ser 0.42 (L) 0.44 - 1.00 mg/dL   Calcium 8.4 (L) 8.9 - 10.3 mg/dL   Total Protein 5.3 (L) 6.5 - 8.1 g/dL   Albumin 2.0 (L) 3.5 - 5.0 g/dL   AST 61 (H) 15 - 41 U/L   ALT 39 14 - 54 U/L   Alkaline Phosphatase 136 (H) 38 - 126 U/L   Total Bilirubin 1.0 0.3 - 1.2 mg/dL   GFR calc non Af Amer >60 >60 mL/min   GFR calc Af Amer  >60 >60 mL/min    Comment: (NOTE) The eGFR has been calculated using the CKD EPI equation. This calculation has not been validated in all clinical situations. eGFR's persistently <60 mL/min signify possible Chronic Kidney Disease.    Anion gap 9 5 - 15  CBC     Status: Abnormal   Collection Time: 01/08/16  3:19 PM  Result Value Ref Range   WBC 11.9 (H) 4.0 - 10.5 K/uL   RBC 3.21 (L) 3.87 - 5.11 MIL/uL   Hemoglobin 8.6 (L) 12.0 - 15.0 g/dL   HCT 26.2 (L) 36.0 - 46.0 %   MCV 81.6 78.0 - 100.0 fL   MCH 26.8 26.0 - 34.0 pg   MCHC 32.8 30.0 - 36.0 g/dL   RDW 18.4 (H) 11.5 - 15.5 %   Platelets 330 150 - 400 K/uL  Differential     Status: Abnormal   Collection Time: 01/08/16  3:19 PM  Result Value Ref Range   Neutrophils Relative % 77 %   Neutro Abs 9.2 (H) 1.7 - 7.7 K/uL   Lymphocytes Relative 17 %   Lymphs Abs 2.0 0.7 - 4.0 K/uL   Monocytes Relative 6 %   Monocytes Absolute 0.7 0.1 - 1.0 K/uL   Eosinophils Relative 0 %   Eosinophils Absolute 0.0 0.0 - 0.7 K/uL   Basophils Relative 0 %   Basophils Absolute 0.0 0.0 - 0.1 K/uL  I-Stat CG4 Lactic Acid, ED     Status: Abnormal   Collection Time: 01/08/16  3:29 PM  Result Value Ref Range   Lactic Acid, Venous 2.83 (HH) 0.5 - 2.0 mmol/L   Comment NOTIFIED PHYSICIAN   Type and screen [place]     Status: None (Preliminary result)   Collection Time: 01/08/16  4:14 PM  Result Value Ref Range   ABO/RH(D) O NEG    Antibody Screen POS    Sample Expiration 01/11/2016    Antibody Identification ANTI D    DAT, IgG NEG    Unit Number [id number]    Blood Component Type RED CELLS,LR    Unit division 00    Status of Unit ALLOCATED    Transfusion Status OK TO TRANSFUSE    Crossmatch Result COMPATIBLE    Unit Number [id number]    Blood Component Type RBC LR PHER1    Unit division 00    Status of Unit ALLOCATED    Transfusion Status OK TO TRANSFUSE    Crossmatch Result COMPATIBLE   Prepare RBC     Status:  None   Collection Time: 01/08/16  4:15 PM  Result Value Ref Range   Order Confirmation ORDER PROCESSED BY BLOOD BANK   Brain natriuretic peptide     Status: None   Collection Time: 01/08/16  4:37 PM  Result Value Ref Range   B Natriuretic Peptide 52.7 0.0 - 100.0 pg/mL  DIC panel     Status: Abnormal   Collection Time: 01/08/16  4:37 PM  Result Value Ref Range   Prothrombin Time 12.7 11.6 - 15.2 seconds   INR 0.93 0.00 - 1.49   aPTT 33 24 - 37 seconds   Fibrinogen 538 (H) 204 - 475 mg/dL   D-Dimer, Quant 3.71 (H) 0.00 - 0.50 ug/mL-FEU    Comment: (NOTE) At the manufacturer cut-off of 0.50 ug/mL FEU, this assay has been documented to exclude PE with a sensitivity and negative predictive value of 97 to 99%.  At this time, this assay has not been approved by the FDA to exclude DVT/VTE. Results should be correlated with clinical presentation.    Platelets 327 150 - 400 K/uL   Smear Review NO SCHISTOCYTES SEEN   I-Stat CG4 Lactic Acid, ED  (not at  [facility])     Status: None   Collection Time: 01/08/16  4:52 PM  Result Value Ref Range   Lactic Acid, Venous 1.53 0.5 - 2.0 mmol/L  Urinalysis, Routine w reflex microscopic (not at [facility])     Status: Abnormal   Collection Time: 01/08/16  5:18 PM  Result Value Ref Range   Color, Urine YELLOW YELLOW   APPearance CLOUDY (A) CLEAR   Specific Gravity, Urine 1.011 1.005 - 1.030   pH 7.5 5.0 - 8.0   Glucose, UA NEGATIVE NEGATIVE mg/dL   Hgb urine dipstick LARGE (A) NEGATIVE   Bilirubin Urine  NEGATIVE NEGATIVE   Ketones, ur NEGATIVE NEGATIVE mg/dL   Protein, ur NEGATIVE NEGATIVE mg/dL   Nitrite NEGATIVE NEGATIVE   Leukocytes, UA MODERATE (A) NEGATIVE  Urine microscopic-add on     Status: Abnormal   Collection Time: 01/08/16  5:18 PM  Result Value Ref Range   Squamous Epithelial / LPF 6-30 (A) NONE SEEN   WBC, UA TOO NUMEROUS TO COUNT 0 - 5 WBC/hpf   RBC / HPF 6-30 0 - 5 RBC/hpf   Bacteria, UA RARE (A) NONE SEEN  Lactic acid, plasma      Status: None   Collection Time: 01/08/16  6:39 PM  Result Value Ref Range   Lactic Acid, Venous 1.7 0.5 - 2.0 mmol/L    Ct Angio Chest Pe W/cm &/or Wo Cm  01/08/2016  CLINICAL DATA:  Status post Cesarean section with wound drainage and leg swelling EXAM: CT ANGIOGRAPHY CHEST CT ABDOMEN AND PELVIS WITH CONTRAST TECHNIQUE: Multidetector CT imaging of the chest was performed using the standard protocol during bolus administration of intravenous contrast. Multiplanar CT image reconstructions and MIPs were obtained to evaluate the vascular anatomy. Multidetector CT imaging of the abdomen and pelvis was performed using the standard protocol during bolus administration of intravenous contrast. CONTRAST:  100 mL Isovue 370. COMPARISON:  01/02/2016 FINDINGS: CTA CHEST FINDINGS The lungs are well aerated bilaterally. Some minimal dependent atelectatic changes are seen. No focal infiltrate or sizable effusion is noted. The thoracic inlet and thoracic aorta are unremarkable. The pulmonary artery is well visualized and demonstrates a normal branching pattern. No filling defects to suggest pulmonary emboli are identified. No significant hilar or mediastinal adenopathy is noted. The breasts are engorged consistent with the postpartum state. No acute bony abnormality is seen. CT ABDOMEN and PELVIS FINDINGS The gallbladder is contracted. The liver, spleen, adrenal glands and pancreas are within normal limits with the exception of a small cyst within the posterior aspect of the spleen. Kidneys the again demonstrates a left renal cyst. Fullness of the right renal collecting system and ureter is noted without definitive obstructive change. The bladder is partially decompressed by Foley catheter. Air is noted in the bladder consistent with the recent catheter placement. Minimal free pelvic fluid is noted as well as a small amount of free fluid adjacent to the liver tip. There are changes in the uterus consistent with the  postpartum state and recent Cesarean section. Adjacent to the C-section line within the myometrium, there is a 9.1 by 7.6 by 10.7 cm mixed attenuation collection consistent with hematoma. It appears slightly larger than that seen on the prior exam. Postsurgical changes in the anterior abdominal wall are seen. Additionally there is a fluid collection anterior to the abdominal musculature which measures 9.2 by 2.3 cm. No air is noted within an this may simply represent a postoperative hematoma. Clinical correlation with the wound drainage is recommended. This collection is new from the prior exam. The appendix is not well visualized. No other focal abnormality is seen. Review of the MIP images confirms the above findings. IMPRESSION: No evidence of pulmonary embolism. Persistent and slightly increased mixed attenuation hematoma adjacent to the Cesarean section scar within the uterus in the right pelvis New fluid collection anterior to the abdominal wall musculature which may simply represent a seroma or hematoma. It is new from the prior exam. The possibility of an underlying abscess could not be totally excluded. Fullness of the right renal collecting system without definitive obstruction. This is likely related to localized  mass effect secondary to the hematoma. Electronically Signed   By: Alcide Clever M.D.   On: 01/08/2016 17:48   Ct Abdomen Pelvis W Contrast  01/08/2016  CLINICAL DATA:  Status post Cesarean section with wound drainage and leg swelling EXAM: CT ANGIOGRAPHY CHEST CT ABDOMEN AND PELVIS WITH CONTRAST TECHNIQUE: Multidetector CT imaging of the chest was performed using the standard protocol during bolus administration of intravenous contrast. Multiplanar CT image reconstructions and MIPs were obtained to evaluate the vascular anatomy. Multidetector CT imaging of the abdomen and pelvis was performed using the standard protocol during bolus administration of intravenous contrast. CONTRAST:  100 mL  Isovue 370. COMPARISON:  01/02/2016 FINDINGS: CTA CHEST FINDINGS The lungs are well aerated bilaterally. Some minimal dependent atelectatic changes are seen. No focal infiltrate or sizable effusion is noted. The thoracic inlet and thoracic aorta are unremarkable. The pulmonary artery is well visualized and demonstrates a normal branching pattern. No filling defects to suggest pulmonary emboli are identified. No significant hilar or mediastinal adenopathy is noted. The breasts are engorged consistent with the postpartum state. No acute bony abnormality is seen. CT ABDOMEN and PELVIS FINDINGS The gallbladder is contracted. The liver, spleen, adrenal glands and pancreas are within normal limits with the exception of a small cyst within the posterior aspect of the spleen. Kidneys the again demonstrates a left renal cyst. Fullness of the right renal collecting system and ureter is noted without definitive obstructive change. The bladder is partially decompressed by Foley catheter. Air is noted in the bladder consistent with the recent catheter placement. Minimal free pelvic fluid is noted as well as a small amount of free fluid adjacent to the liver tip. There are changes in the uterus consistent with the postpartum state and recent Cesarean section. Adjacent to the C-section line within the myometrium, there is a 9.1 by 7.6 by 10.7 cm mixed attenuation collection consistent with hematoma. It appears slightly larger than that seen on the prior exam. Postsurgical changes in the anterior abdominal wall are seen. Additionally there is a fluid collection anterior to the abdominal musculature which measures 9.2 by 2.3 cm. No air is noted within an this may simply represent a postoperative hematoma. Clinical correlation with the wound drainage is recommended. This collection is new from the prior exam. The appendix is not well visualized. No other focal abnormality is seen. Review of the MIP images confirms the above findings.  IMPRESSION: No evidence of pulmonary embolism. Persistent and slightly increased mixed attenuation hematoma adjacent to the Cesarean section scar within the uterus in the right pelvis New fluid collection anterior to the abdominal wall musculature which may simply represent a seroma or hematoma. It is new from the prior exam. The possibility of an underlying abscess could not be totally excluded. Fullness of the right renal collecting system without definitive obstruction. This is likely related to localized mass effect secondary to the hematoma. Electronically Signed   By: Alcide Clever M.D.   On: 01/08/2016 17:48   Dg Chest Portable 1 View  01/08/2016  CLINICAL DATA:  Recent C-section with wound drainage EXAM: PORTABLE CHEST 1 VIEW COMPARISON:  02/19/2015 FINDINGS: The heart size and mediastinal contours are within normal limits. Both lungs are clear. The visualized skeletal structures are unremarkable. IMPRESSION: No active disease. Electronically Signed   By: Alcide Clever M.D.   On: 01/08/2016 16:54    Assessment/Plan: Pelvic hematoma and generalized edema in postoperative patient s/p cesarean section 4/3 17 Picture less suggestive of sepsis than  at initial presentation Plan  I agree with prompt transfusion 2 units prbc, , depending on response to initial transfusion. Will give lasix after second unit to assist with diuresis Pt could benefit from 2 units FFP due to massive edema. While pt may need consideration of evacuation of hematoma if response slows, at present I believe her response to transfusion and FFP would dramatically improve status Will add  miralax qd.  Jonnie Kind 01/08/2016

## 2016-01-08 NOTE — Progress Notes (Addendum)
Pharmacy Antibiotic Note  April [name] is a 25 y.o. female admitted on 01/08/2016 with intra-abdominal infection.  Patient with elevated lactic acid and tachycardia; Code Sepsis called.  Patient s/p C-section 8 days ago.  Presents to ED with complaints of drainage from incision site, lower extremity edema, lower abdominal pain and frequency & burning with urination.  Pharmacy has been consulted for Zosyn & Vancomycin dosing.  Plan: Zosyn 3.375g IV q8h (4 hour infusion).  Vancomycin 1gm IV x 1 followed by 750mg  IV q8h   (Goal 15-20 mcg/ml for Vancomycin Trough level) F/U cultures     Temp (24hrs), Avg:98.8 F (37.1 C), Min:98.8 F (37.1 C), Max:98.8 F (37.1 C)   Recent Labs Lab 01/02/16 0747 01/02/16 1934 01/03/16 0506 01/08/16 1519 01/08/16 1529  WBC 14.3* 13.3* 11.1* 11.9*  --   CREATININE  --   --   --  0.42*  --   LATICACIDVEN  --   --   --   --  2.83*    Estimated Creatinine Clearance: 93.8 mL/min (by C-G formula based on Cr of 0.42).    Allergies  Allergen Reactions  . Codeine Nausea And Vomiting    Specifically tylenol 3    Antimicrobials this admission: 4/11 Zosyn >>   4/11 Vanc >>  Dose adjustments this admission:    Microbiology results: 4/11 BCx:   4/11 UCx:     Thank you for allowing pharmacy to be a part of this patient's care.  Maryellen PilePoindexter, Lubertha Leite Trefz, PharmD 01/08/2016 4:18 PM

## 2016-01-08 NOTE — Progress Notes (Signed)
*  Preliminary Results* Bilateral lower extremity venous duplex completed. Study was technically limited due to pitting edema. Visualized veins of bilateral lower extremities are negative for deep vein thrombosis. There is no evidence of Baker's cyst bilaterally.  01/08/2016 6:52 PM  Gertie FeyMichelle Wayden Schwertner, RVT, RDCS, RDMS

## 2016-01-08 NOTE — ED Notes (Signed)
Orthostatic unable to obtain provider made aware

## 2016-01-08 NOTE — ED Notes (Signed)
Bed: WA11 Expected date:  Expected time:  Means of arrival:  Comments: Hold for triage 1 

## 2016-01-08 NOTE — ED Notes (Signed)
Lab was called. Blood is being cross matched now and should be ready in about 45 min. ICU was notified of patient and they will be re-notified when intensivist is here.  Pt refusing a rectal temp. A temp foley will be placed instead

## 2016-01-08 NOTE — H&P (Signed)
PULMONARY / CRITICAL CARE MEDICINE   Name: April [name] MRN: [id number] DOB: 1991-02-23    ADMISSION DATE:  01/08/2016 CONSULTATION DATE:  01/08/16  REFERRING MD: ED  CHIEF COMPLAINT:  Abdominal pain, drainage from incision site.   HISTORY OF PRESENT ILLNESS:   April [name] is a 25 y/o with PMH of depression, GERD. She underwent a C section on 4/3 for malpresentation of twins. She is given 5 units of blood during the surgery for a drop in Hb to 5.7. Noted to have a large pelvic hematoma on 4/5.  She presents to ED on 4/11 with abdominal pain, drainage from the incision site, burning sensation on urination. She had an episode of vomiting and nausea yesterday but denies any GI symptoms right now. She has constipation and is finding it difficult to pass stools.  Code sepsis called for elevated LA and tachycardia. Concern for impending volume overload as she has LE edema. In ED she was started on vanco, zosyn and started on 300cc/kg fluid bolus. She was also given morphine for pain and 2 gm Mg for elevated BP. 2 units blood ordered. PCCM called to admit the patient.     PAST MEDICAL HISTORY :  She  has a past medical history of Depression; GERD (gastroesophageal reflux disease); UTI (urinary tract infection) in pregnancy in first trimester; Herpes; and Infection.  PAST SURGICAL HISTORY: She  has past surgical history that includes Dilation and curettage of uterus and Cesarean section (N/A, 12/31/2015).  Allergies  Allergen Reactions  . Codeine Nausea And Vomiting    Specifically tylenol 3    No current facility-administered medications on file prior to encounter.   Current Outpatient Prescriptions on File Prior to Encounter  Medication Sig  . acyclovir (ZOVIRAX) 200 MG capsule Take 400 mg by mouth 3 (three) times daily as needed (for outbreaks).  Marland Kitchen ibuprofen (ADVIL,MOTRIN) 600 MG tablet Take 1 tablet (600 mg total) by mouth every 6 (six) hours.  Marland Kitchen oxyCODONE-acetaminophen (PERCOCET)  10-325 MG tablet Take 1 tablet by mouth every 4 (four) hours as needed for pain.  . pantoprazole (PROTONIX) 40 MG tablet Take 1 tablet (40 mg total) by mouth daily. (Patient taking differently: Take 40 mg by mouth daily as needed (reflux). )  . Prenatal Vit-Min-FA-Fish Oil ([employer] PRENATAL GUMMY) 0.4-113.5 MG CHEW Chew 2 each by mouth daily.    FAMILY HISTORY:  Her indicated that her mother is alive. She indicated that her father is alive.   SOCIAL HISTORY: She  reports that she has been smoking Cigarettes.  She started smoking about 8 years ago. She has a .75 pack-year smoking history. She has never used smokeless tobacco. She reports that she does not drink alcohol or use illicit drugs.  REVIEW OF SYSTEMS:   Lower abdominal pain, serosanguineous discharge from the incision site. Burning sensation with urination, increased frequency of urination. One episode of vomiting. No nausea, diarrhea, constipation. Denies any fevers, chills, dyspnea, chest pain, palpitation.  SUBJECTIVE:   VITAL SIGNS: BP 145/94 mmHg  Pulse 90  Temp(Src) 98.8 F (37.1 C) (Oral)  Resp 18  Ht  (1.549 m)  Wt 140 lb (63.504 kg)  BMI 26.47 kg/m2  SpO2 100%  HEMODYNAMICS:    VENTILATOR SETTINGS:    INTAKE / OUTPUT:    PHYSICAL EXAMINATION: General: Awake, alert oriented. Moderate distress.Moving around in bed in pain. Neuro:  Moving all 4 extremities, no focal deficits HEENT:  Moist mucous membranes, no thyromegaly, JVD Cardiovascular:  Tachycardia, regular rhythm,  no murmurs rubs gallops Lungs:  Clear to auscultation, no wheezes, crackles Abdomen:  Soft, tenderness especially in the right lower quadrant, contact operation scar. Musculoskeletal:  3+ bilateral lower extremity edema Skin:    LABS:  BMET  Recent Labs Lab 01/08/16 1519  NA 140  K 3.4*  CL 109  CO2 22  BUN 9  CREATININE 0.42*  GLUCOSE 117*    Electrolytes  Recent Labs Lab 01/08/16 1519  CALCIUM 8.4*     CBC  Recent Labs Lab 01/02/16 1934 01/03/16 0506 01/08/16 1519 01/08/16 1637  WBC 13.3* 11.1* 11.9*  --   HGB 8.7* 8.5* 8.6*  --   HCT 26.5* 25.3* 26.2*  --   PLT 159 176 330 327    Coag's  Recent Labs Lab 01/08/16 1637  APTT 33  INR 0.93    Sepsis Markers  Recent Labs Lab 01/08/16 1529 01/08/16 1652  LATICACIDVEN 2.83* 1.53    ABG No results for input(s): PHART, PCO2ART, PO2ART in the last 168 hours.  Liver Enzymes  Recent Labs Lab 01/08/16 1519  AST 61*  ALT 39  ALKPHOS 136*  BILITOT 1.0  ALBUMIN 2.0*    Cardiac Enzymes No results for input(s): TROPONINI, PROBNP in the last 168 hours.  Glucose No results for input(s): GLUCAP in the last 168 hours.  Imaging Dg Chest Portable 1 View  01/08/2016  CLINICAL DATA:  Recent C-section with wound drainage EXAM: PORTABLE CHEST 1 VIEW COMPARISON:  02/19/2015 FINDINGS: The heart size and mediastinal contours are within normal limits. Both lungs are clear. The visualized skeletal structures are unremarkable. IMPRESSION: No active disease. Electronically Signed   By: Alcide CleverMark  Lukens M.D.   On: 01/08/2016 16:54     STUDIES:  CT abd, pelvis 4/11 >  CULTURES: Bcx X 2 4/11 > Urine culture 4/11 >  ANTIBIOTICS: Vanco 4/11> Zosyn 4/11>  SIGNIFICANT EVENTS:   LINES/TUBES:   DISCUSSION: 25 year old with recent C-section presenting with abdominal pain, discharge from insertion. Concern for abdominal sepsis. She is getting adequately resuscitated and lactic acid is normalizing. She hemodynamically stable. Need to evaluate for postpartum cardiomyopathy given her bilateral lower extremity edema and R/O DVT  ASSESSMENT / PLAN:  PULMONARY A: Stable P:   Supplemental O2. Chest x-ray in a.m. Watch for volume overload.  CARDIOVASCULAR A:  Sinus tachycardia Elevated lactic acid P:  Recheck lactic acid after volume resuscitation. Follow-up echocardiogram, troponins. LE dopplers to r/o  DVT  RENAL A:   Stable P:   Monitor urine output and Cr  GASTROINTESTINAL A:   S/p C section 4/3. Has pelvic hematoma post op Abd pain Mildly elevated LFTs N/V, constipation. May be obstipated from opiod pain medication P:   CT abd, pelvis to r/o intra abdominal infection She may need laxatives for constipation if CT does not show any acute intraabdominal pathology Follow LFTs Keep NPO Protonix IV for GERD  HEMATOLOGIC A:   Blood loss anemia during surgery P:  Follow CT abd/pelvis to eval size of pelvic hematoma Getting 2 units PRBCs Monitor H/H. Transfuse for Hb < 7  INFECTIOUS A:   Sepsis Infected C section wound P:   Vanco, zosyn Follow cultures Ob Gyn consulted for evaluation.  ENDOCRINE A:   Stable P:    NEUROLOGIC A:   Stable P:   Morphine PRN pain  FAMILY  - Updates: Pt and grandmother updated at bedside.  - Inter-disciplinary family meet or Palliative Care meeting due by:  01/15/16  Critical care time- 45 mins.  Chilton Greathouse MD [facility] Pager [phone] If no answer or after 3pm call: [phone] 01/08/2016, 5:45 PM

## 2016-01-08 NOTE — ED Notes (Signed)
Temp foley in place

## 2016-01-09 ENCOUNTER — Inpatient Hospital Stay (HOSPITAL_COMMUNITY): Payer: Medicaid Other

## 2016-01-09 DIAGNOSIS — A419 Sepsis, unspecified organism: Secondary | ICD-10-CM

## 2016-01-09 DIAGNOSIS — O165 Unspecified maternal hypertension, complicating the puerperium: Secondary | ICD-10-CM | POA: Diagnosis present

## 2016-01-09 DIAGNOSIS — Z98891 History of uterine scar from previous surgery: Secondary | ICD-10-CM

## 2016-01-09 LAB — ECHOCARDIOGRAM COMPLETE
Height: 61 in
WEIGHTICAEL: 2437.41 [oz_av]

## 2016-01-09 LAB — COMPREHENSIVE METABOLIC PANEL
ALBUMIN: 2.1 g/dL — AB (ref 3.5–5.0)
ALK PHOS: 177 U/L — AB (ref 38–126)
ALT: 41 U/L (ref 14–54)
AST: 62 U/L — AB (ref 15–41)
Anion gap: 9 (ref 5–15)
BILIRUBIN TOTAL: 0.9 mg/dL (ref 0.3–1.2)
BUN: 9 mg/dL (ref 6–20)
CALCIUM: 8.5 mg/dL — AB (ref 8.9–10.3)
CO2: 22 mmol/L (ref 22–32)
CREATININE: 0.31 mg/dL — AB (ref 0.44–1.00)
Chloride: 111 mmol/L (ref 101–111)
GFR calc Af Amer: >60 mL/min (ref >60)
GFR calc non Af Amer: >60 mL/min (ref >60)
GLUCOSE: 100 mg/dL — AB (ref 65–99)
Potassium: 3.4 mmol/L — ABNORMAL LOW (ref 3.5–5.1)
Sodium: 142 mmol/L (ref 135–145)
TOTAL PROTEIN: 5.6 g/dL — AB (ref 6.5–8.1)

## 2016-01-09 LAB — PROTEIN / CREATININE RATIO, URINE
Creatinine, Urine: 16.28 mg/dL
Total Protein, Urine: <6 mg/dL

## 2016-01-09 LAB — CBC
HEMATOCRIT: 30.4 % — AB (ref 36.0–46.0)
HEMOGLOBIN: 10.1 g/dL — AB (ref 12.0–15.0)
MCH: 27.7 pg (ref 26.0–34.0)
MCHC: 33.2 g/dL (ref 30.0–36.0)
MCV: 83.3 fL (ref 78.0–100.0)
Platelets: 281 10*3/uL (ref 150–400)
RBC: 3.65 MIL/uL — AB (ref 3.87–5.11)
RDW: 17.9 % — ABNORMAL HIGH (ref 11.5–15.5)
WBC: 12.6 10*3/uL — AB (ref 4.0–10.5)

## 2016-01-09 LAB — TROPONIN I

## 2016-01-09 MED ORDER — HYDRALAZINE HCL 20 MG/ML IJ SOLN
10.0000 mg | INTRAMUSCULAR | Status: DC | PRN
  Administered 2016-01-09 – 2016-01-11 (×2): 20 mg via INTRAVENOUS
  Filled 2016-01-09 (×2): qty 1

## 2016-01-09 MED ORDER — MAGNESIUM SULFATE 50 % IJ SOLN
2.0000 g/h | INTRAVENOUS | Status: DC
  Administered 2016-01-09 – 2016-01-10 (×2): 2 g/h via INTRAVENOUS
  Filled 2016-01-09 (×2): qty 80

## 2016-01-09 MED ORDER — MAGNESIUM SULFATE BOLUS VIA INFUSION
4.0000 g | Freq: Once | INTRAVENOUS | Status: AC
  Administered 2016-01-09: 4 g via INTRAVENOUS
  Filled 2016-01-09: qty 500

## 2016-01-09 MED ORDER — COMPLETENATE 29-1 MG PO CHEW
1.0000 | CHEWABLE_TABLET | Freq: Every day | ORAL | Status: DC
  Administered 2016-01-09 – 2016-01-13 (×5): 1 via ORAL
  Filled 2016-01-09 (×7): qty 1

## 2016-01-09 MED ORDER — MORPHINE SULFATE (PF) 4 MG/ML IV SOLN
4.0000 mg | Freq: Once | INTRAVENOUS | Status: AC
  Administered 2016-01-09: 4 mg via INTRAVENOUS
  Filled 2016-01-09: qty 1

## 2016-01-09 NOTE — Progress Notes (Signed)
PULMONARY / CRITICAL CARE MEDICINE   Name: April SongRachel L [name] MRN: [id number] DOB: 05-04-1991    ADMISSION DATE:  01/08/2016 CONSULTATION DATE:  01/08/16  REFERRING MD: ED  CHIEF COMPLAINT:  Abdominal pain, drainage from incision site.   HISTORY OF PRESENT ILLNESS:   [name] is a 25 y/o with PMH of depression, GERD. She underwent a C section on 4/3 for malpresentation of twins. She is given 5 units of blood during the surgery for a drop in Hb to 5.7. Noted to have a large pelvic hematoma on 4/5.  She presents to ED on 4/11 with abdominal pain, drainage from the incision site, burning sensation on urination. She had an episode of vomiting and nausea yesterday but denies any GI symptoms right now. She has constipation and is finding it difficult to pass stools.  Code sepsis called for elevated LA and tachycardia. Concern for impending volume overload as she has LE edema. In ED she was started on vanco, zosyn and started on 300cc/kg fluid bolus. She was also given morphine for pain and 2 gm Mg for elevated BP. 2 units blood ordered. PCCM called to admit the patient.     SUBJECTIVE: afebrile Hypertensive C/o pain all over abdomen & incision site Good UO   VITAL SIGNS: BP 184/79 mmHg  Pulse 69  Temp(Src) 99.7 F (37.6 C) (Core (Comment))  Resp 34  Ht 5\' 1"  (1.549 m)  Wt 152 lb 5.4 oz (69.1 kg)  BMI 28.80 kg/m2  SpO2 93%  HEMODYNAMICS:    VENTILATOR SETTINGS:    INTAKE / OUTPUT: I/O last 3 completed shifts: In: 2029.3 [I.V.:508.3; Blood:1071; IV Piggyback:450] Out: 3050 [Urine:3050]  PHYSICAL EXAMINATION: General: Awake, alert oriented. Moderate distress.Moving around in bed in pain. Neuro:  Moving all 4 extremities, no focal deficits HEENT:  Moist mucous membranes, no thyromegaly, JVD Cardiovascular:  Tachycardia, regular rhythm, no murmurs rubs gallops Lungs:  Clear to auscultation, no wheezes, crackles Abdomen:  Soft, diffuse tenderness , no guarding, CS  scar. Musculoskeletal:  3+ bilateral lower extremity edema   LABS:  BMET  Recent Labs Lab 01/08/16 1519  NA 140  K 3.4*  CL 109  CO2 22  BUN 9  CREATININE 0.42*  GLUCOSE 117*    Electrolytes  Recent Labs Lab 01/08/16 1519  CALCIUM 8.4*    CBC  Recent Labs Lab 01/02/16 1934 01/03/16 0506 01/08/16 1519 01/08/16 1637  WBC 13.3* 11.1* 11.9*  --   HGB 8.7* 8.5* 8.6*  --   HCT 26.5* 25.3* 26.2*  --   PLT 159 176 330 327    Coag's  Recent Labs Lab 01/08/16 1637  APTT 33  INR 0.93    Sepsis Markers  Recent Labs Lab 01/08/16 1529 01/08/16 1652 01/08/16 1759 01/08/16 1839  LATICACIDVEN 2.83* 1.53  --  1.7  PROCALCITON  --   --  <0.10  --     ABG No results for input(s): PHART, PCO2ART, PO2ART in the last 168 hours.  Liver Enzymes  Recent Labs Lab 01/08/16 1519  AST 61*  ALT 39  ALKPHOS 136*  BILITOT 1.0  ALBUMIN 2.0*    Cardiac Enzymes  Recent Labs Lab 01/08/16 1759  TROPONINI <0.03    Glucose No results for input(s): GLUCAP in the last 168 hours.  Imaging Ct Angio Chest Pe W/cm &/or Wo Cm  01/08/2016  CLINICAL DATA:  Status post Cesarean section with wound drainage and leg swelling EXAM: CT ANGIOGRAPHY CHEST CT ABDOMEN AND PELVIS WITH CONTRAST TECHNIQUE:  Multidetector CT imaging of the chest was performed using the standard protocol during bolus administration of intravenous contrast. Multiplanar CT image reconstructions and MIPs were obtained to evaluate the vascular anatomy. Multidetector CT imaging of the abdomen and pelvis was performed using the standard protocol during bolus administration of intravenous contrast. CONTRAST:  100 mL Isovue 370. COMPARISON:  01/02/2016 FINDINGS: CTA CHEST FINDINGS The lungs are well aerated bilaterally. Some minimal dependent atelectatic changes are seen. No focal infiltrate or sizable effusion is noted. The thoracic inlet and thoracic aorta are unremarkable. The pulmonary artery is well visualized  and demonstrates a normal branching pattern. No filling defects to suggest pulmonary emboli are identified. No significant hilar or mediastinal adenopathy is noted. The breasts are engorged consistent with the postpartum state. No acute bony abnormality is seen. CT ABDOMEN and PELVIS FINDINGS The gallbladder is contracted. The liver, spleen, adrenal glands and pancreas are within normal limits with the exception of a small cyst within the posterior aspect of the spleen. Kidneys the again demonstrates a left renal cyst. Fullness of the right renal collecting system and ureter is noted without definitive obstructive change. The bladder is partially decompressed by Foley catheter. Air is noted in the bladder consistent with the recent catheter placement. Minimal free pelvic fluid is noted as well as a small amount of free fluid adjacent to the liver tip. There are changes in the uterus consistent with the postpartum state and recent Cesarean section. Adjacent to the C-section line within the myometrium, there is a 9.1 by 7.6 by 10.7 cm mixed attenuation collection consistent with hematoma. It appears slightly larger than that seen on the prior exam. Postsurgical changes in the anterior abdominal wall are seen. Additionally there is a fluid collection anterior to the abdominal musculature which measures 9.2 by 2.3 cm. No air is noted within an this may simply represent a postoperative hematoma. Clinical correlation with the wound drainage is recommended. This collection is new from the prior exam. The appendix is not well visualized. No other focal abnormality is seen. Review of the MIP images confirms the above findings. IMPRESSION: No evidence of pulmonary embolism. Persistent and slightly increased mixed attenuation hematoma adjacent to the Cesarean section scar within the uterus in the right pelvis New fluid collection anterior to the abdominal wall musculature which may simply represent a seroma or hematoma. It is  new from the prior exam. The possibility of an underlying abscess could not be totally excluded. Fullness of the right renal collecting system without definitive obstruction. This is likely related to localized mass effect secondary to the hematoma. Electronically Signed   By: Alcide Clever M.D.   On: 01/08/2016 17:48   Ct Abdomen Pelvis W Contrast  01/08/2016  CLINICAL DATA:  Status post Cesarean section with wound drainage and leg swelling EXAM: CT ANGIOGRAPHY CHEST CT ABDOMEN AND PELVIS WITH CONTRAST TECHNIQUE: Multidetector CT imaging of the chest was performed using the standard protocol during bolus administration of intravenous contrast. Multiplanar CT image reconstructions and MIPs were obtained to evaluate the vascular anatomy. Multidetector CT imaging of the abdomen and pelvis was performed using the standard protocol during bolus administration of intravenous contrast. CONTRAST:  100 mL Isovue 370. COMPARISON:  01/02/2016 FINDINGS: CTA CHEST FINDINGS The lungs are well aerated bilaterally. Some minimal dependent atelectatic changes are seen. No focal infiltrate or sizable effusion is noted. The thoracic inlet and thoracic aorta are unremarkable. The pulmonary artery is well visualized and demonstrates a normal branching pattern. No filling defects  to suggest pulmonary emboli are identified. No significant hilar or mediastinal adenopathy is noted. The breasts are engorged consistent with the postpartum state. No acute bony abnormality is seen. CT ABDOMEN and PELVIS FINDINGS The gallbladder is contracted. The liver, spleen, adrenal glands and pancreas are within normal limits with the exception of a small cyst within the posterior aspect of the spleen. Kidneys the again demonstrates a left renal cyst. Fullness of the right renal collecting system and ureter is noted without definitive obstructive change. The bladder is partially decompressed by Foley catheter. Air is noted in the bladder consistent with  the recent catheter placement. Minimal free pelvic fluid is noted as well as a small amount of free fluid adjacent to the liver tip. There are changes in the uterus consistent with the postpartum state and recent Cesarean section. Adjacent to the C-section line within the myometrium, there is a 9.1 by 7.6 by 10.7 cm mixed attenuation collection consistent with hematoma. It appears slightly larger than that seen on the prior exam. Postsurgical changes in the anterior abdominal wall are seen. Additionally there is a fluid collection anterior to the abdominal musculature which measures 9.2 by 2.3 cm. No air is noted within an this may simply represent a postoperative hematoma. Clinical correlation with the wound drainage is recommended. This collection is new from the prior exam. The appendix is not well visualized. No other focal abnormality is seen. Review of the MIP images confirms the above findings. IMPRESSION: No evidence of pulmonary embolism. Persistent and slightly increased mixed attenuation hematoma adjacent to the Cesarean section scar within the uterus in the right pelvis New fluid collection anterior to the abdominal wall musculature which may simply represent a seroma or hematoma. It is new from the prior exam. The possibility of an underlying abscess could not be totally excluded. Fullness of the right renal collecting system without definitive obstruction. This is likely related to localized mass effect secondary to the hematoma. Electronically Signed   By: Alcide Clever M.D.   On: 01/08/2016 17:48   Dg Chest Portable 1 View  01/08/2016  CLINICAL DATA:  Recent C-section with wound drainage EXAM: PORTABLE CHEST 1 VIEW COMPARISON:  02/19/2015 FINDINGS: The heart size and mediastinal contours are within normal limits. Both lungs are clear. The visualized skeletal structures are unremarkable. IMPRESSION: No active disease. Electronically Signed   By: Alcide Clever M.D.   On: 01/08/2016 16:54      STUDIES:  CT abd, pelvis 4/11 >hematoma adjacent to the Cesarean section scar within the uterus  New fluid collection anterior to the abdominal wall musculature  CULTURES: Bcx X 2 4/11 > Urine culture 4/11 >  ANTIBIOTICS: Vanco 4/11> Zosyn 4/11>  SIGNIFICANT EVENTS:   LINES/TUBES:   DISCUSSION: 25 year old with recent C-section presenting with abdominal pain, discharge from insertion. Concern for abdominal sepsis. Lactate cleared Need to evaluate for postpartum cardiomyopathy given her bilateral lower extremity edema & mild hypoxia  ASSESSMENT / PLAN:  PULMONARY A: Stable P:   Supplemental O2. Watch for volume overload.  CARDIOVASCULAR A:  Sinus tachycardia Elevated lactic acid -cleared LE dopplers neg Hypertension - Ob concerned about pre-ecclampsia P:  Follow-up echocardiogram Mag drip per Ob Hydrallainze prn   RENAL A:   Stable P:   Monitor urine output and Cr  GASTROINTESTINAL A:   S/p C section 4/3. Has pelvic hematoma post op Abd pain Mildly elevated LFTs N/V, constipation. May be obstipated from opiod pain medication P:   She may need laxatives for  constipation Follow LFTs Keep NPO Protonix IV for GERD  HEMATOLOGIC A:   Blood loss anemia during surgery S/p 2 units PRBCs - Hb improved from 6 to 8.6 P:   Monitor H/H. Transfuse for Hb < 7  INFECTIOUS A:   Sepsis Infected C section wound & uterine hematoma P:   Vanco, zosyn Follow cultures, low pct reassuring Ob Gyn following  ENDOCRINE A:   Stable P:    NEUROLOGIC A:   Stable P:   Morphine PRN pain Tylenol IV x 1 if LFTs ok  FAMILY  - Updates: Pt updated at bedside.  - Inter-disciplinary family meet or Palliative Care meeting due by: NA   The patient is critically ill with multiple organ systems failure and requires high complexity decision making for assessment and support, frequent evaluation and titration of therapies, application of advanced monitoring  technologies and extensive interpretation of multiple databases. Critical Care Time devoted to patient care services described in this note independent of APP time is 32 minutes.   Cyril Mourning MD. Tonny Bollman. [facility] Pager [phone] If no response call [phone]    01/09/2016, 9:53 AM

## 2016-01-09 NOTE — Progress Notes (Signed)
FACULTY PRACTICE PROGRESS NOTE  [name] MVH:[id number] DOB: 1991-03-14 DOA: 01/08/2016 PCP: No PCP Per Patient  Assessment/Plan: 1. Sepsis  Vancomycin/zosyn  Blood cultures pending  No source yet - she does have a pelvic hematoma, as well as a 9x2cm fluid collection in the anterior abdominal wall, however it does not appear infected.     2. Pelvic Hematoma   When patient is more stable, possibly consult IR to see if they can drain vs surgical management     3. Hypertension  This is new for her and is concerning for preeclampsia/early HELLP, especially given her degree of edema and RUQ pain, HA, scotoma.    CMP, CBC, urine P:C ratio now  Start Magnesium Sulfate: 4g bolus with 2g per hour x 24 hours  Would encourage aggressive hypertension management with Apresoline. 4. POD#9 from LTCS for malpresenting twins  Elpidio GaleaLochia is normal  No apparent wound infection  PNV  Pain control  Code Status: full For Daytime Coverage: [phone]    Antibiotics:  Vancomycin start 4/11  Zosyn start 4/11  HPI/Subjective: Patient having abdominal pain in her RUQ/right flank, worse with movement and palpation.  She also endorses a HA for the past couple of days with scotoma.  She feels awful.    Objective: Filed Vitals:   01/09/16 0800 01/09/16 0900  BP: 162/82 184/79  Pulse: 69 69  Temp: 99.7 F (37.6 C) 99.7 F (37.6 C)  Resp: 35 34    Intake/Output Summary (Last 24 hours) at 01/09/16 0941 Last data filed at 01/09/16 0700  Gross per 24 hour  Intake 2029.33 ml  Output   3050 ml  Net -1020.67 ml   Filed Weights   01/08/16 1648 01/08/16 1829 01/09/16 0500  Weight: 140 lb (63.504 kg) 152 lb 8.9 oz (69.2 kg) 152 lb 5.4 oz (69.1 kg)    Exam:   General:  A&Ox3, NAD, although uncomfortable.  Cardiovascular: RR, NL S1/S2 sounds.  No murmurs, rubs.  Respiratory: CTA bilaterally.  No wheezing or rales.  Abdomen: RUQ tenderness.  There is extensive bruising on the  anterior abdominal wall superior to the incision sight.  Fundus is firm.  Incision is clean, dry, and intact.  Vaginal: lochia normal  Extremity: 3+ pitting edema, bruising on her feet bilaterally, as well as bruising on her right knee.  Legs are tender - negative for DVT.  Data Reviewed: Basic Metabolic Panel:  Recent Labs Lab 01/08/16 1519  NA 140  K 3.4*  CL 109  CO2 22  GLUCOSE 117*  BUN 9  CREATININE 0.42*  CALCIUM 8.4*   Liver Function Tests:  Recent Labs Lab 01/08/16 1519  AST 61*  ALT 39  ALKPHOS 136*  BILITOT 1.0  PROT 5.3*  ALBUMIN 2.0*    Recent Labs Lab 01/08/16 1759  LIPASE 16  AMYLASE 15*   No results for input(s): AMMONIA in the last 168 hours. CBC:  Recent Labs Lab 01/02/16 1934 01/03/16 0506 01/08/16 1519 01/08/16 1637  WBC 13.3* 11.1* 11.9*  --   NEUTROABS  --   --  9.2*  --   HGB 8.7* 8.5* 8.6*  --   HCT 26.5* 25.3* 26.2*  --   MCV 79.3 78.3 81.6  --   PLT 159 176 330 327   Cardiac Enzymes:  Recent Labs Lab 01/08/16 1759  TROPONINI <0.03   BNP (last 3 results)  Recent Labs  01/08/16 1637  BNP 52.7    ProBNP (last 3 results) No results for  input(s): PROBNP in the last 8760 hours.  CBG: No results for input(s): GLUCAP in the last 168 hours.  Recent Results (from the past 240 hour(s))  MRSA PCR Screening     Status: Abnormal   Collection Time: 01/08/16  6:41 PM  Result Value Ref Range Status   MRSA by PCR POSITIVE (A) NEGATIVE Final    Comment:        The GeneXpert MRSA Assay (FDA approved for NASAL specimens only), is one component of a comprehensive MRSA colonization surveillance program. It is not intended to diagnose MRSA infection nor to guide or monitor treatment for MRSA infections. RESULT CALLED TO, READ BACK BY AND VERIFIED WITH: B MCNABB RN 2321 01/08/16 A NAVARRO      Studies: Ct Angio Chest Pe W/cm &/or Wo Cm  01/08/2016  CLINICAL DATA:  Status post Cesarean section with wound drainage and  leg swelling EXAM: CT ANGIOGRAPHY CHEST CT ABDOMEN AND PELVIS WITH CONTRAST TECHNIQUE: Multidetector CT imaging of the chest was performed using the standard protocol during bolus administration of intravenous contrast. Multiplanar CT image reconstructions and MIPs were obtained to evaluate the vascular anatomy. Multidetector CT imaging of the abdomen and pelvis was performed using the standard protocol during bolus administration of intravenous contrast. CONTRAST:  100 mL Isovue 370. COMPARISON:  01/02/2016 FINDINGS: CTA CHEST FINDINGS The lungs are well aerated bilaterally. Some minimal dependent atelectatic changes are seen. No focal infiltrate or sizable effusion is noted. The thoracic inlet and thoracic aorta are unremarkable. The pulmonary artery is well visualized and demonstrates a normal branching pattern. No filling defects to suggest pulmonary emboli are identified. No significant hilar or mediastinal adenopathy is noted. The breasts are engorged consistent with the postpartum state. No acute bony abnormality is seen. CT ABDOMEN and PELVIS FINDINGS The gallbladder is contracted. The liver, spleen, adrenal glands and pancreas are within normal limits with the exception of a small cyst within the posterior aspect of the spleen. Kidneys the again demonstrates a left renal cyst. Fullness of the right renal collecting system and ureter is noted without definitive obstructive change. The bladder is partially decompressed by Foley catheter. Air is noted in the bladder consistent with the recent catheter placement. Minimal free pelvic fluid is noted as well as a small amount of free fluid adjacent to the liver tip. There are changes in the uterus consistent with the postpartum state and recent Cesarean section. Adjacent to the C-section line within the myometrium, there is a 9.1 by 7.6 by 10.7 cm mixed attenuation collection consistent with hematoma. It appears slightly larger than that seen on the prior exam.  Postsurgical changes in the anterior abdominal wall are seen. Additionally there is a fluid collection anterior to the abdominal musculature which measures 9.2 by 2.3 cm. No air is noted within an this may simply represent a postoperative hematoma. Clinical correlation with the wound drainage is recommended. This collection is new from the prior exam. The appendix is not well visualized. No other focal abnormality is seen. Review of the MIP images confirms the above findings. IMPRESSION: No evidence of pulmonary embolism. Persistent and slightly increased mixed attenuation hematoma adjacent to the Cesarean section scar within the uterus in the right pelvis New fluid collection anterior to the abdominal wall musculature which may simply represent a seroma or hematoma. It is new from the prior exam. The possibility of an underlying abscess could not be totally excluded. Fullness of the right renal collecting system without definitive obstruction. This is likely  related to localized mass effect secondary to the hematoma. Electronically Signed   By: Alcide Clever M.D.   On: 01/08/2016 17:48   Ct Abdomen Pelvis W Contrast  01/08/2016  CLINICAL DATA:  Status post Cesarean section with wound drainage and leg swelling EXAM: CT ANGIOGRAPHY CHEST CT ABDOMEN AND PELVIS WITH CONTRAST TECHNIQUE: Multidetector CT imaging of the chest was performed using the standard protocol during bolus administration of intravenous contrast. Multiplanar CT image reconstructions and MIPs were obtained to evaluate the vascular anatomy. Multidetector CT imaging of the abdomen and pelvis was performed using the standard protocol during bolus administration of intravenous contrast. CONTRAST:  100 mL Isovue 370. COMPARISON:  01/02/2016 FINDINGS: CTA CHEST FINDINGS The lungs are well aerated bilaterally. Some minimal dependent atelectatic changes are seen. No focal infiltrate or sizable effusion is noted. The thoracic inlet and thoracic aorta are  unremarkable. The pulmonary artery is well visualized and demonstrates a normal branching pattern. No filling defects to suggest pulmonary emboli are identified. No significant hilar or mediastinal adenopathy is noted. The breasts are engorged consistent with the postpartum state. No acute bony abnormality is seen. CT ABDOMEN and PELVIS FINDINGS The gallbladder is contracted. The liver, spleen, adrenal glands and pancreas are within normal limits with the exception of a small cyst within the posterior aspect of the spleen. Kidneys the again demonstrates a left renal cyst. Fullness of the right renal collecting system and ureter is noted without definitive obstructive change. The bladder is partially decompressed by Foley catheter. Air is noted in the bladder consistent with the recent catheter placement. Minimal free pelvic fluid is noted as well as a small amount of free fluid adjacent to the liver tip. There are changes in the uterus consistent with the postpartum state and recent Cesarean section. Adjacent to the C-section line within the myometrium, there is a 9.1 by 7.6 by 10.7 cm mixed attenuation collection consistent with hematoma. It appears slightly larger than that seen on the prior exam. Postsurgical changes in the anterior abdominal wall are seen. Additionally there is a fluid collection anterior to the abdominal musculature which measures 9.2 by 2.3 cm. No air is noted within an this may simply represent a postoperative hematoma. Clinical correlation with the wound drainage is recommended. This collection is new from the prior exam. The appendix is not well visualized. No other focal abnormality is seen. Review of the MIP images confirms the above findings. IMPRESSION: No evidence of pulmonary embolism. Persistent and slightly increased mixed attenuation hematoma adjacent to the Cesarean section scar within the uterus in the right pelvis New fluid collection anterior to the abdominal wall musculature  which may simply represent a seroma or hematoma. It is new from the prior exam. The possibility of an underlying abscess could not be totally excluded. Fullness of the right renal collecting system without definitive obstruction. This is likely related to localized mass effect secondary to the hematoma. Electronically Signed   By: Alcide Clever M.D.   On: 01/08/2016 17:48   Dg Chest Portable 1 View  01/08/2016  CLINICAL DATA:  Recent C-section with wound drainage EXAM: PORTABLE CHEST 1 VIEW COMPARISON:  02/19/2015 FINDINGS: The heart size and mediastinal contours are within normal limits. Both lungs are clear. The visualized skeletal structures are unremarkable. IMPRESSION: No active disease. Electronically Signed   By: Alcide Clever M.D.   On: 01/08/2016 16:54    Scheduled Meds: . pantoprazole (PROTONIX) IV  40 mg Intravenous Q24H  . piperacillin-tazobactam (ZOSYN)  IV  3.375 g Intravenous Q8H  . polyethylene glycol  17 g Oral Daily  . prenatal vitamin w/FE, FA  1 tablet Oral Q1200  . sodium chloride  30 mL/kg Intravenous Once  . vancomycin  750 mg Intravenous Q8H   Continuous Infusions: . sodium chloride    . sodium chloride 50 mL/hr at 01/08/16 2050    Active Problems:   Sepsis (HCC)    Time spent: 30    Brooke Pace  Faculty Practice Attending Physician [facility] Phone: [phone] 01/09/2016, 9:41 AM  LOS: 1 day

## 2016-01-09 NOTE — Progress Notes (Signed)
Echocardiogram 2D Echocardiogram has been performed.  01/09/2016 12:02 PM Gertie FeyMichelle Vin Yonke, RVT, RDCS, RDMS

## 2016-01-09 NOTE — Care Management Note (Signed)
Case Management Note  Patient Details  Name: April [name] MRN: [id number] Date of Birth: [birth year]  Subjective/Objective:                 Post partum with temp, elevated wbc's, drainage form c-section incision   Action/Plan:Date:  January 09, 2016 Chart reviewed for concurrent status and case management needs. Will continue to follow patient for changes and needs: Marcelle Smilinghonda Yuriko Portales, BSN, RN, [place]   [phone]   Expected Discharge Date:   (unknown)               Expected Discharge Plan:  Home/Self Care  In-House Referral:  NA  Discharge planning Services  CM Consult  Post Acute Care Choice:  NA Choice offered to:  NA  DME Arranged:    DME Agency:     HH Arranged:    HH Agency:     Status of Service:  In process, will continue to follow  Medicare Important Message Given:    Date Medicare IM Given:    Medicare IM give by:    Date Additional Medicare IM Given:    Additional Medicare Important Message give by:     If discussed at Long Length of Stay Meetings, dates discussed:    Additional Comments:  Golda AcreDavis, Edwar Coe Lynn, RN 01/09/2016, 12:05 PM

## 2016-01-10 ENCOUNTER — Encounter (HOSPITAL_COMMUNITY): Payer: Self-pay | Admitting: Emergency Medicine

## 2016-01-10 DIAGNOSIS — E876 Hypokalemia: Secondary | ICD-10-CM

## 2016-01-10 LAB — TYPE AND SCREEN
ABO/RH(D): O NEG
ANTIBODY SCREEN: POSITIVE
DAT, IgG: NEGATIVE
UNIT DIVISION: 0
UNIT DIVISION: 0

## 2016-01-10 LAB — BASIC METABOLIC PANEL
Anion gap: 10 (ref 5–15)
BUN: 5 mg/dL — AB (ref 6–20)
CO2: 24 mmol/L (ref 22–32)
CREATININE: 0.36 mg/dL — AB (ref 0.44–1.00)
Calcium: 6.8 mg/dL — ABNORMAL LOW (ref 8.9–10.3)
Chloride: 106 mmol/L (ref 101–111)
GFR calc Af Amer: >60 mL/min (ref >60)
Glucose, Bld: 105 mg/dL — ABNORMAL HIGH (ref 65–99)
POTASSIUM: 2.9 mmol/L — AB (ref 3.5–5.1)
SODIUM: 140 mmol/L (ref 135–145)

## 2016-01-10 LAB — PREPARE FRESH FROZEN PLASMA
Unit division: 0
Unit division: 0

## 2016-01-10 LAB — CBC
HCT: 32.6 % — ABNORMAL LOW (ref 36.0–46.0)
Hemoglobin: 10.4 g/dL — ABNORMAL LOW (ref 12.0–15.0)
MCH: 26.6 pg (ref 26.0–34.0)
MCHC: 31.9 g/dL (ref 30.0–36.0)
MCV: 83.4 fL (ref 78.0–100.0)
PLATELETS: 367 10*3/uL (ref 150–400)
RBC: 3.91 MIL/uL (ref 3.87–5.11)
RDW: 18.1 % — ABNORMAL HIGH (ref 11.5–15.5)
WBC: 15.3 10*3/uL — ABNORMAL HIGH (ref 4.0–10.5)

## 2016-01-10 LAB — URINE CULTURE

## 2016-01-10 MED ORDER — FUROSEMIDE 10 MG/ML IJ SOLN
20.0000 mg | Freq: Two times a day (BID) | INTRAMUSCULAR | Status: AC
  Administered 2016-01-10 – 2016-01-11 (×3): 20 mg via INTRAVENOUS
  Filled 2016-01-10 (×3): qty 2

## 2016-01-10 MED ORDER — MORPHINE SULFATE (PF) 2 MG/ML IV SOLN
2.0000 mg | INTRAVENOUS | Status: DC | PRN
  Administered 2016-01-10 (×3): 4 mg via INTRAVENOUS
  Administered 2016-01-10: 2 mg via INTRAVENOUS
  Administered 2016-01-10: 4 mg via INTRAVENOUS
  Administered 2016-01-10 (×3): 2 mg via INTRAVENOUS
  Administered 2016-01-10: 4 mg via INTRAVENOUS
  Administered 2016-01-10: 2 mg via INTRAVENOUS
  Administered 2016-01-10: 4 mg via INTRAVENOUS
  Administered 2016-01-10: 2 mg via INTRAVENOUS
  Administered 2016-01-11 – 2016-01-12 (×13): 4 mg via INTRAVENOUS
  Filled 2016-01-10: qty 1
  Filled 2016-01-10 (×2): qty 2
  Filled 2016-01-10: qty 1
  Filled 2016-01-10 (×3): qty 2
  Filled 2016-01-10: qty 1
  Filled 2016-01-10 (×2): qty 2
  Filled 2016-01-10: qty 1
  Filled 2016-01-10 (×3): qty 2
  Filled 2016-01-10: qty 1
  Filled 2016-01-10 (×2): qty 2
  Filled 2016-01-10 (×2): qty 1
  Filled 2016-01-10: qty 2
  Filled 2016-01-10: qty 1
  Filled 2016-01-10 (×5): qty 2

## 2016-01-10 MED ORDER — ACETAMINOPHEN 10 MG/ML IV SOLN
1000.0000 mg | Freq: Once | INTRAVENOUS | Status: AC
  Administered 2016-01-10: 1000 mg via INTRAVENOUS
  Filled 2016-01-10: qty 100

## 2016-01-10 MED ORDER — BISACODYL 10 MG RE SUPP: 10.0000 mg | Freq: Every day | RECTAL | Status: DC | PRN

## 2016-01-10 MED ORDER — POTASSIUM CHLORIDE CRYS ER 20 MEQ PO TBCR
20.0000 meq | EXTENDED_RELEASE_TABLET | Freq: Two times a day (BID) | ORAL | Status: AC
  Administered 2016-01-10 – 2016-01-11 (×4): 20 meq via ORAL
  Filled 2016-01-10 (×4): qty 1

## 2016-01-10 NOTE — Progress Notes (Signed)
eLink Physician-Brief Progress Note Patient Name: Michaelene SongRachel L Odonoghue DOB: 04/15/1991 MRN: [id number]   Date of Service  01/10/2016  HPI/Events of Note  RN calls for SCDs  eICU Interventions  Will order SCDs.      Intervention Category Intermediate Interventions: Other:  Daneen SchickJose Angelo A De Dios 01/10/2016, 8:09 PM

## 2016-01-10 NOTE — Progress Notes (Signed)
PULMONARY / CRITICAL CARE MEDICINE   Name: April [name] MRN: [id number] DOB: 12/06/1990    ADMISSION DATE:  01/08/2016 CONSULTATION DATE:  01/08/16  REFERRING MD: ED  CHIEF COMPLAINT:  Abdominal pain, drainage from incision site.   HISTORY OF PRESENT ILLNESS:   April [name] is a 25 y/o with PMH of depression, GERD. She underwent a C section on 4/3 for malpresentation of twins. She is given 5 units of blood during the surgery for a drop in Hb to 5.7. Noted to have a large pelvic hematoma on 4/5.  She presents to ED on 4/11 with abdominal pain, drainage from the incision site, burning sensation on urination. She had an episode of vomiting and nausea yesterday but denies any GI symptoms right now. She has constipation and is finding it difficult to pass stools.  Code sepsis called for elevated LA and tachycardia. Concern for impending volume overload as she has LE edema. In ED she was started on vanco, zosyn and started on 300cc/kg fluid bolus. She was also given morphine for pain and 2 gm Mg for elevated BP. 2 units blood ordered. PCCM called to admit the patient.     SUBJECTIVE: afebrile Bp improved Continues with pain all over abdomen & incision site excellent UO   VITAL SIGNS: BP 143/87 mmHg  Pulse 90  Temp(Src) 99 F (37.2 C) (Core (Comment))  Resp 30  Ht  (1.549 m)  Wt 148 lb 13 oz (67.5 kg)  BMI 28.13 kg/m2  SpO2 96%  HEMODYNAMICS:    VENTILATOR SETTINGS:    INTAKE / OUTPUT: I/O last 3 completed shifts: In: 3729.3 [P.O.:1000; I.V.:858.3; Blood:1071; IV Piggyback:800] Out: 8100 [Urine:8100]  PHYSICAL EXAMINATION: General: Awake, alert oriented. mild distress.Moving around in bed in pain. Neuro:  Moving all 4 extremities, no focal deficits HEENT:  Moist mucous membranes, no thyromegaly, JVD Cardiovascular:  Tachycardia, regular rhythm, no murmurs rubs gallops Lungs:  Clear to auscultation, no wheezes, crackles Abdomen:  Soft, tenderness over RHC, no guarding,  CS scar. Musculoskeletal:  3+ bilateral lower extremity edema   LABS:  BMET  Recent Labs Lab 01/08/16 1519 01/09/16 1011 01/10/16 0317  NA 140 142 140  K 3.4* 3.4* 2.9*  CL 109 111 106  CO2 BUN 9 9 5*  CREATININE 0.42* 0.31* 0.36*  GLUCOSE 117* 100* 105*    Electrolytes  Recent Labs Lab 01/08/16 1519 01/09/16 1011 01/10/16 0317  CALCIUM 8.4* 8.5* 6.8*    CBC  Recent Labs Lab 01/08/16 1519 01/08/16 1637 01/09/16 1011 01/10/16 0317  WBC 11.9*  --  12.6* 15.3*  HGB 8.6*  --  10.1* 10.4*  HCT 26.2*  --  30.4* 32.6*  PLT 330 327 281 367    Coag's  Recent Labs Lab 01/08/16 1637  APTT 33  INR 0.93    Sepsis Markers  Recent Labs Lab 01/08/16 1529 01/08/16 1652 01/08/16 1759 01/08/16 1839  LATICACIDVEN 2.83* 1.53  --  1.7  PROCALCITON  --   --  <0.10  --     ABG No results for input(s): PHART, PCO2ART, PO2ART in the last 168 hours.  Liver Enzymes  Recent Labs Lab 01/08/16 1519 01/09/16 1011  AST 61* 62*  ALT 39 41  ALKPHOS 136* 177*  BILITOT 1.0 0.9  ALBUMIN 2.0* 2.1*    Cardiac Enzymes  Recent Labs Lab 01/08/16 1759 01/09/16 1011  TROPONINI <0.03 <0.03    Glucose No results for input(s): GLUCAP in the last 168 hours.  Imaging No results found.   STUDIES:  CT abd, pelvis 4/11 >hematoma adjacent to the Cesarean section scar within the uterus  New fluid collection anterior to the abdominal wall musculature  CULTURES: Bcx X 2 4/11 >ng Urine culture 4/11 >ng  ANTIBIOTICS: Vanco 4/11>4/13 Zosyn 4/11>  SIGNIFICANT EVENTS: Echo 4/12 >> nml LV fn  LINES/TUBES:   DISCUSSION: 25 year old with recent C-section presenting with abdominal pain, discharge from insertion. Concern for abdominal sepsis. Lactate cleared Need to evaluate for postpartum cardiomyopathy given her bilateral lower extremity edema & mild hypoxia  ASSESSMENT / PLAN:  PULMONARY A: Stable P:   Supplemental O2. Watch for volume  overload.  CARDIOVASCULAR A:  Sinus tachycardia Elevated lactic acid -cleared LE dopplers neg Hypertension - Ob concerned about pre-ecclampsia - doubt this P:  Dc Mag drip per Ob Hydrallainze prn with parameters   RENAL A:   hypokalemia P:   Monitor urine output and Cr Replete lytes as needed Gentle diuresis with lasix 20 q 12   GASTROINTESTINAL A:   S/p C section 4/3. Has pelvic hematoma post op Abd pain Mildly elevated LFTs N/V, constipation. May be obstipated from opiod pain medication P:   laxatives for constipation Follow LFTs Keep NPO per Ob Protonix IV for GERD  HEMATOLOGIC A:   Blood loss anemia during surgery S/p 2 units PRBCs - Hb improved from 6 to 8.6 P:   Monitor H/H. Transfuse for Hb < 7  INFECTIOUS A:   Sepsis Infected C section wound & uterine hematoma P:   Dc Vanco,ct  zosyn Follow cultures, low pct reassuring - if remains neg -can stop all abx Ob Gyn following  ENDOCRINE A:   Stable P:    NEUROLOGIC A:   Stable P:   Morphine PRN pain Tylenol IV x 1 if LFTs ok  FAMILY  - Updates: Pt updated at bedside.  - Inter-disciplinary family meet or Palliative Care meeting due by: NA  Can transfer to [facility]  Summary - Doubt sepsis syndrome, most likely post op hematoma & severe anemia - doubt repeat surgery required  The patient is critically ill with multiple organ systems failure and requires high complexity decision making for assessment and support, frequent evaluation and titration of therapies, application of advanced monitoring technologies and extensive interpretation of multiple databases. Critical Care Time devoted to patient care services described in this note independent of APP time is 32 minutes.   April Mourningakesh Alva MD. April [name]. [facility] Pager [phone] If no response call [phone]    01/10/2016, 11:16 AM

## 2016-01-10 NOTE — Progress Notes (Signed)
Day 10 s/p cesarean section for twin pregnancy, complicated by preop anemia(7.6 hgb), and postop hematoma and anemia, [facility]. This hosp course notable for additional transfusion 2 units PRBC and 2 units FFP. The patient had elevated pressures yesterday, plus mildly increase LFT's so pt tx'd as precaution with Mag Sulfate x 24 hours to cover possibility of Postpartum preeclampsia. Pt diuresed significantly, I/O down 3 liters yesterday, and weight down 2+kg in 24 hrs.  Subjective: Patient reports still having pain in rlq at area of hematoma. Pt tolerating po , but reduced appetite. + flatus , no bm in 24 hrs..    Objective: I have reviewed patient's vital signs, intake and output, medications and labs. Echocardiogram;LVEF 55-60%, normal wall thickness, normal wall motion, normal  diastolic function, mild TR, top normal RVSP of 32 mmHg, elevated  RA pressure of 8 mmHg.   Physical Examination: General appearance - oriented to person, place, and time, normal appearing weight, chronically ill appearing and fatigued, still on Mag. Mental status - depressed mood, affect appropriate to mood, drowsy on mag Abdomen - soft, nontender, nondistended, no masses or organomegaly bowel sounds hypoactive abdomen soft, moderate fullness. Bruising of abd wall has diminished slightly. Extremities - pedal edema and shin edema still 2-3 + but not taut as was yesterday +  Assessment: s/p * No surgery found *: cesarean section day 10-,readmit , day 4, gradual improvement.\\ I do not feel sepsis is a concern at this time Hypokalemia,  Improving edema + Discussed options of whether to consider evacuation of hematoma vs continuing to allow gradual reabsorption of hematoma, I believe gradual reabsorption is a reasonable option and pt supports that plan.   Plan: discuss with Dr Vassie LollAlva. who has been paged. Pulmonary : stable, would  continue diuresis  Cardiovasc : Preeclampsia workup negative , pressures more reasonable, Mag to stop at 10:30 , would treat BP's as needed for BP >140/90. Cardiac echo normal EF, mild RA pressure elevation, continue diuretic   Lasix 20 po written today and tomorrow  GI : constipated,        Slowed by hematoma      Continue Miralax , add dulcolax suppos today       At this time I am not in favor of laparotomy with hematoma evacuation, would allow gradual reabsorption.  Hematologic  Hgb stable at 10.4 CBC Latest Ref Rng 01/10/2016 01/09/2016 01/08/2016  WBC 4.0 - 10.5 K/uL 15.3(H) 12.6(H) -  Hemoglobin 12.0 - 15.0 g/dL 10.4(L) 10.1(L) -  Hematocrit 36.0 - 46.0 % 32.6(L) 30.4(L) -  Platelets 150 - 400 K/uL 367 281 327   [facility] .   LOS: 2 days    Liesel Peckenpaugh V 01/10/2016, 9:52 AM

## 2016-01-10 NOTE — Progress Notes (Signed)
eLink Physician-Brief Progress Note Patient Name: Michaelene SongRachel L Hindes DOB: 02-Sep-1991 MRN: [id number]   Date of Service  01/10/2016  HPI/Events of Note  Patient states that pain is not controlled by Morphine 2-4 mg Q 3 hours PRN.   eICU Interventions  Will increase frequency of Morphine dose to Q 2 hours.      Intervention Category Intermediate Interventions: Pain - evaluation and management  Sommer,Steven Eugene 01/10/2016, 12:02 AM

## 2016-01-10 NOTE — Plan of Care (Signed)
Problem: Activity: Goal: Risk for activity intolerance will decrease Outcome: Not Progressing Patient refused to mobilize today. Patient states her abdominal pain and swelling from her legs hurts too bad and she is unable to move because pain is 10/10. One time order of IV tylenol and prn morphine given before attempting to ambulate but patient still refuses. Risk of not ambulating explained to patient and she states understanding. Will continue to monitor and encourage patient to increase activity.

## 2016-01-11 DIAGNOSIS — O165 Unspecified maternal hypertension, complicating the puerperium: Secondary | ICD-10-CM

## 2016-01-11 LAB — CBC
HCT: 32.8 % — ABNORMAL LOW (ref 36.0–46.0)
HEMOGLOBIN: 10.7 g/dL — AB (ref 12.0–15.0)
MCH: 27.5 pg (ref 26.0–34.0)
MCHC: 32.6 g/dL (ref 30.0–36.0)
MCV: 84.3 fL (ref 78.0–100.0)
PLATELETS: 396 10*3/uL (ref 150–400)
RBC: 3.89 MIL/uL (ref 3.87–5.11)
RDW: 17.9 % — ABNORMAL HIGH (ref 11.5–15.5)
WBC: 11.3 10*3/uL — AB (ref 4.0–10.5)

## 2016-01-11 LAB — BASIC METABOLIC PANEL
ANION GAP: 11 (ref 5–15)
BUN: 10 mg/dL (ref 6–20)
CO2: 25 mmol/L (ref 22–32)
Calcium: 8.5 mg/dL — ABNORMAL LOW (ref 8.9–10.3)
Chloride: 105 mmol/L (ref 101–111)
Creatinine, Ser: 0.39 mg/dL — ABNORMAL LOW (ref 0.44–1.00)
GFR calc Af Amer: >60 mL/min (ref >60)
Glucose, Bld: 93 mg/dL (ref 65–99)
POTASSIUM: 3.6 mmol/L (ref 3.5–5.1)
SODIUM: 141 mmol/L (ref 135–145)

## 2016-01-11 MED ORDER — HYDRALAZINE HCL 25 MG PO TABS
25.0000 mg | ORAL_TABLET | Freq: Four times a day (QID) | ORAL | Status: DC
  Administered 2016-01-11 – 2016-01-12 (×4): 25 mg via ORAL
  Filled 2016-01-11 (×4): qty 1

## 2016-01-11 MED ORDER — FUROSEMIDE 20 MG PO TABS: 20.0000 mg | ORAL_TABLET | Freq: Two times a day (BID) | ORAL | Status: DC

## 2016-01-11 MED ORDER — MUPIROCIN 2 % EX OINT
1.0000 "application " | TOPICAL_OINTMENT | Freq: Two times a day (BID) | CUTANEOUS | Status: DC
  Administered 2016-01-11 – 2016-01-13 (×5): 1 via NASAL
  Filled 2016-01-11 (×2): qty 22

## 2016-01-11 MED ORDER — CHLORHEXIDINE GLUCONATE CLOTH 2 % EX PADS
6.0000 | MEDICATED_PAD | Freq: Every day | CUTANEOUS | Status: DC
  Administered 2016-01-12 – 2016-01-13 (×2): 6 via TOPICAL

## 2016-01-11 MED ORDER — POLYETHYLENE GLYCOL 3350 17 G PO PACK
17.0000 g | PACK | Freq: Two times a day (BID) | ORAL | Status: DC
  Administered 2016-01-12: 17 g via ORAL
  Filled 2016-01-11 (×4): qty 1

## 2016-01-11 MED ORDER — SENNOSIDES-DOCUSATE SODIUM 8.6-50 MG PO TABS
2.0000 | ORAL_TABLET | Freq: Two times a day (BID) | ORAL | Status: DC
  Administered 2016-01-11 – 2016-01-12 (×4): 2 via ORAL
  Filled 2016-01-11 (×4): qty 2

## 2016-01-11 MED ORDER — PANTOPRAZOLE SODIUM 40 MG PO TBEC
40.0000 mg | DELAYED_RELEASE_TABLET | Freq: Every day | ORAL | Status: DC
  Administered 2016-01-11 – 2016-01-12 (×2): 40 mg via ORAL
  Filled 2016-01-11 (×2): qty 1

## 2016-01-11 NOTE — Plan of Care (Signed)
Problem: Pain Managment: Goal: General experience of comfort will improve Outcome: Not Progressing Per pt, pain is not being managed adequately as she is not able to rest for more than an hour before sharp abdominal pain wakes her up. Currently prescribed pain medication helps some but is not sufficient. Pain is interfering with ADLs per pt but she is becoming more independent with RN and family encouragement. Side effects of opioid pain medication discussed with pt and family. Will have to address pain management needs before discharge.

## 2016-01-11 NOTE — Progress Notes (Signed)
PT is POD #11 s/p LTCS for nonvertex twin IUP Pt seen this am at 0730  Subjective: Patient reports incisional pain.  She has a decreased appetitive but, is without N/V.  She was able to ambulate yesterday to the BR.  Objective: I have reviewed patient's vital signs, intake and output, medications, labs, microbiology and radiology results.  Temp:  [98.8 F (37.1 C)-100 F (37.8 C)] 99.1 F (37.3 C) (04/14 1200) Pulse Rate:  [62-114] 88 (04/14 1200) Resp:  [20-32] 23 (04/14 1200) BP: (136-174)/(77-105) 141/77 mmHg (04/14 1200) SpO2:  [86 %-97 %] 97 % (04/14 1200) Weight:  [143 lb 1.3 oz (64.9 kg)] 143 lb 1.3 oz (64.9 kg) (04/14 0500)  I/O last 3 completed shifts: In: 550 [I.V.:100; IV Piggyback:450] Out: 8950 [Urine:8950] Total I/O In: 290 [P.O.:240; IV Piggyback:50] Out: 2000 [Urine:2000]   General: alert and mild distress Resp: clear to auscultation bilaterally Cardio: regular rate and rhythm, S1, S2 normal, no murmur, click, rub or gallop GI: soft, non-tender; bowel sounds normal; no masses,  no organomegaly Extremities: edema bilaterally pittiing in LE- symmetric Vaginal Bleeding: minimal   01/08/2016 CLINICAL DATA: Status post Cesarean section with wound drainage and leg swelling  EXAM: CT ANGIOGRAPHY CHEST  CT ABDOMEN AND PELVIS WITH CONTRAST  TECHNIQUE: Multidetector CT imaging of the chest was performed using the standard protocol during bolus administration of intravenous contrast. Multiplanar CT image reconstructions and MIPs were obtained to evaluate the vascular anatomy. Multidetector CT imaging of the abdomen and pelvis was performed using the standard protocol during bolus administration of intravenous contrast.  CONTRAST: 100 mL Isovue 370.  COMPARISON: 01/02/2016  FINDINGS: CTA CHEST FINDINGS  The lungs are well aerated bilaterally. Some minimal dependent atelectatic changes are seen. No focal infiltrate or sizable effusion is  noted.  The thoracic inlet and thoracic aorta are unremarkable. The pulmonary artery is well visualized and demonstrates a normal branching pattern. No filling defects to suggest pulmonary emboli are identified. No significant hilar or mediastinal adenopathy is noted. The breasts are engorged consistent with the postpartum state. No acute bony abnormality is seen.  CT ABDOMEN and PELVIS FINDINGS  The gallbladder is contracted. The liver, spleen, adrenal glands and pancreas are within normal limits with the exception of a small cyst within the posterior aspect of the spleen. Kidneys the again demonstrates a left renal cyst. Fullness of the right renal collecting system and ureter is noted without definitive obstructive change. The bladder is partially decompressed by Foley catheter. Air is noted in the bladder consistent with the recent catheter placement.  Minimal free pelvic fluid is noted as well as a small amount of free fluid adjacent to the liver tip. There are changes in the uterus consistent with the postpartum state and recent Cesarean section. Adjacent to the C-section line within the myometrium, there is a 9.1 by 7.6 by 10.7 cm mixed attenuation collection consistent with hematoma. It appears slightly larger than that seen on the prior exam. Postsurgical changes in the anterior abdominal wall are seen. Additionally there is a fluid collection anterior to the abdominal musculature which measures 9.2 by 2.3 cm. No air is noted within an this may simply represent a postoperative hematoma. Clinical correlation with the wound drainage is recommended. This collection is new from the prior exam.  The appendix is not well visualized. No other focal abnormality is seen.  Review of the MIP images confirms the above findings.  IMPRESSION: No evidence of pulmonary embolism.  Persistent and slightly increased mixed  attenuation hematoma adjacent to the Cesarean section  scar within the uterus in the right pelvis  New fluid collection anterior to the abdominal wall musculature which may simply represent a seroma or hematoma. It is new from the prior exam. The possibility of an underlying abscess could not be totally excluded.  Fullness of the right renal collecting system without definitive obstruction. This is likely related to localized mass effect secondary to the hematoma.  Assessment: POD #11 s/p LTCS for non vertex twin IUP  progressing well Post op course complicated with pelvic hematoma and sepsis- pt improving clinically.   Plan: Atbx being discontinue- no clear source of infxn- being observed for 24 hours. Pelvic hematoma clinically stable.  Pt should f/u with OB/GYN in 1 week post discharge.  Cont diuresis   [facility]  [facility]  LOS: 3 days    [name], [name] 01/11/2016, 12:01 PM For questions or concerns please call the on call physician [phone]

## 2016-01-11 NOTE — Progress Notes (Signed)
Pharmacy Antibiotic Note  April [name] is a 25 y.o. female admitted on 01/08/2016 with intra-abdominal infection.  Patient with elevated lactic acid and tachycardia; Code Sepsis called.  Patient s/p C-section 8 days ago.  Presents to ED with complaints of drainage from incision site, lower extremity edema, lower abdominal pain and frequency & burning with urination.  Pharmacy has been consulted for Zosyn & Vancomycin dosing on 4/11 and vanc was d/c'd 4/13  Plan: 1) Continue current Zosyn dosing but with improved WBC and afebrile, what is plan for continuing abx's at this point? Discontinue?  Height: 5\' 1"  (154.9 cm) Weight: 143 lb 1.3 oz (64.9 kg) IBW/kg (Calculated) : 47.8  Temp (24hrs), Avg:99.3 F (37.4 C), Min:98.8 F (37.1 C), Max:100 F (37.8 C)   Recent Labs Lab 01/08/16 1519 01/08/16 1529 01/08/16 1652 01/08/16 1839 01/09/16 1011 01/10/16 0317 01/11/16 0318  WBC 11.9*  --   --   --  12.6* 15.3* 11.3*  CREATININE 0.42*  --   --   --  0.31* 0.36* 0.39*  LATICACIDVEN  --  2.83* 1.53 1.7  --   --   --     Estimated Creatinine Clearance: 94.3 mL/min (by C-G formula based on Cr of 0.39).    Allergies  Allergen Reactions  . Codeine Nausea And Vomiting    Specifically tylenol 3    Antimicrobials this admission: 4/11 Zosyn >>   4/11 Vanc >> 4/13  Dose adjustments this admission:  n/a  Microbiology results: 4/11 BCx:  ngtd 4/11 UCx:   40k lactobacillus - likely contaminant 4/11 MRSA PCR: positive   Thank you for allowing pharmacy to be a part of this patient's care.   Hessie KnowsJustin M Daymon Hora, PharmD, BCPS Pager [phone] 01/11/2016 9:56 AM

## 2016-01-11 NOTE — Plan of Care (Signed)
Problem: Activity: Goal: Risk for activity intolerance will decrease Outcome: Not Progressing Pt refused to be OOB for breakfast this AM, educated patient about benefits of ambulating and being OOB, encouraged to try again for other meals throughout the day.

## 2016-01-11 NOTE — Progress Notes (Signed)
Patient Demographics  April [name], is a 25 y.o. female, DOB - [birth year], ZOX:[id number]  Admit date - 01/08/2016   Admitting Physician Shane Crutch, MD  Outpatient Primary MD for the patient is No PCP Per Patient  LOS - 3   Chief Complaint  Patient presents with  . Post-op Problem       Admission HPI/Brief narrative: 25 y/o with PMH of depression, GERD. She underwent a C section on 4/3 for malpresentation of twins. She is given 5 units of blood during the surgery for a drop in Hb to 5.7. Noted to have a large pelvic hematoma on 4/5. Admitted with sepsis giving elevated lactic acid, tachycardia, as well as lower extremity edema, initially on vancomycin and Zosyn, negative septic workup so far, repeat CT abdomen significant for intra-uterine hematoma and abdominal wall hematoma as well. Subjective:   April [name] today has, No headache, No chest pain, complains of abdominal pain, ports appetite is improving.  Assessment & Plan    Active Problems:   S/P cesarean section   Sepsis (HCC)   Hypertension in pregnancy, postpartum condition   Pelvic hematoma, delivered with postpartum complication  SIRS - No clear source of infection, blood cultures with no growth to date, positive urinalysis, but urine culture growing 40,000 colonies of lactobacillus, so unlikely UTI. - OB following regarding C-section wound, do not think it's infected - IV vancomycin stopped 4/13, continue with IV Zosyn, Propulsid and within normal limits, would discontinue all antibiotic and 24 hours.  Intrauterine hematoma/abdominal wall hematoma - Monitor H&H closely and transfuse as needed - Received 2 units PRBC on 4/11 - Continue with when necessary pain medicine - SCD for  DVT prophylaxis, encourage to ambulate. - Management per OB service  Hypertension - Patient does not carry a diagnosis of hypertension prior  To admission - Treated with magnesium sulfate for 24 hours to cover possibility of postpartum preeclampsia - Blood pressure remains uncontrolled, will start on low dose by mouth hydralazine.  Lower extremity edema - Improving with diuresis - Lower extremity Doppler negative - EF 55-60%, with normal wall motion  Anemia of acute blood loss - Monitor closely and transfuse as needed  Hypokalemia - Repleted, monitor closely  Code Status: Full  Family Communication: Discussed with the onset bedside  Disposition Plan: Remains in stepdown   Procedures  none   Consults   [facility]   Medications  Scheduled Meds: . furosemide  20 mg Oral BID  . hydrALAZINE  25 mg Oral 4 times per day  . pantoprazole  40 mg Oral QHS  . piperacillin-tazobactam (ZOSYN)  IV  3.375 g Intravenous Q8H  . polyethylene glycol  17 g Oral BID  . potassium chloride  20 mEq Oral BID  . prenatal vitamin w/FE, FA  1 tablet Oral Daily  . senna-docusate  2 tablet Oral BID  . sodium chloride  30 mL/kg Intravenous Once   Continuous Infusions:  PRN Meds:.sodium chloride, bisacodyl, hydrALAZINE, morphine injection, ondansetron (ZOFRAN) IV  DVT Prophylaxis  SCDs   Lab Results  Component Value Date   PLT 396 01/11/2016    Antibiotics   Anti-infectives    Start     Dose/Rate Route Frequency Ordered Stop   01/09/16 0200  vancomycin (VANCOCIN) IVPB 750 mg/150 ml premix  Status:  Discontinued     750 mg 150 mL/hr over 60 Minutes Intravenous Every 8 hours 01/08/16 1833 01/10/16 1122   01/09/16 0000  piperacillin-tazobactam (ZOSYN) IVPB 3.375 g     3.375 g 12.5 mL/hr over 240 Minutes Intravenous Every 8 hours 01/08/16 1622     01/08/16 1700  vancomycin (VANCOCIN) IVPB 1000 mg/200 mL premix     1,000 mg 200 mL/hr over 60 Minutes Intravenous  Once 01/08/16 1653 01/08/16 1854   01/08/16 1615  piperacillin-tazobactam (ZOSYN) IVPB 3.375 g     3.375 g 100 mL/hr over 30 Minutes Intravenous STAT  01/08/16 1605 01/08/16 1709          Objective:   Filed Vitals:   01/11/16 0400 01/11/16 0500 01/11/16 0800 01/11/16 0900  BP:   174/102 140/79  Pulse:   62 114  Temp: 100 F (37.8 C)  99.3 F (37.4 C) 99 F (37.2 C)  TempSrc: Core (Comment)  Oral   Resp:   24 26  Height:      Weight:  64.9 kg (143 lb 1.3 oz)    SpO2:   94% 94%    Wt Readings from Last 3 Encounters:  01/11/16 64.9 kg (143 lb 1.3 oz)  12/30/15 64.139 kg (141 lb 6.4 oz)  12/17/15 63.776 kg (140 lb 9.6 oz)     Intake/Output Summary (Last 24 hours) at 01/11/16 1021 Last data filed at 01/11/16 0834  Gross per 24 hour  Intake    250 ml  Output   4950 ml  Net  -4700 ml     Physical Exam  Awake Alert, Oriented X 3,  Supple Neck,No JVD,  Symmetrical Chest wall movement, Good air movement bilaterally, CTAB Tachycardic, No Gallops,Rubs or new Murmurs, No Parasternal Heave +ve B.Sounds, soft, tenderness over right lower quadrant at the right edge of CS scar. No Cyanosis, Clubbing or edema, No new Rash or bruise     Data Review   Micro Results Recent Results (from the past 240 hour(s))  Culture, blood (routine x 2)     Status: None (Preliminary result)   Collection Time: 01/08/16  3:18 PM  Result Value Ref Range Status   Specimen Description BLOOD RIGHT ARM  Final   Special Requests BOTTLES DRAWN AEROBIC ONLY  Final   Culture   Final    NO GROWTH 2 DAYS Performed at [facility]    Report Status PENDING  Incomplete  Culture, blood (routine x 2)     Status: None (Preliminary result)   Collection Time: 01/08/16  4:38 PM  Result Value Ref Range Status   Specimen Description BLOOD LEFT FOREARM  Final   Special Requests BOTTLES DRAWN AEROBIC AND ANAEROBIC  Final   Culture   Final    NO GROWTH 2 DAYS Performed at [facility]    Report Status PENDING  Incomplete  Urine culture     Status: Abnormal   Collection Time: 01/08/16  5:18 PM  Result Value Ref Range Status    Specimen Description URINE, CATHETERIZED  Final   Special Requests NONE  Final   Culture (A)  Final    40,000 COLONIES/mL LACTOBACILLUS SPECIES Standardized susceptibility testing for this organism is not available. Performed at [facility]    Report Status 01/10/2016 FINAL  Final  MRSA PCR Screening     Status: Abnormal   Collection Time: 01/08/16  6:41 PM  Result Value  Ref Range Status   MRSA by PCR POSITIVE (A) NEGATIVE Final    Comment:        The GeneXpert MRSA Assay (FDA approved for NASAL specimens only), is one component of a comprehensive MRSA colonization surveillance program. It is not intended to diagnose MRSA infection nor to guide or monitor treatment for MRSA infections. RESULT CALLED TO, READ BACK BY AND VERIFIED WITH: B MCNABB RN 2321 01/08/16 A NAVARRO     Radiology Reports Ct Angio Chest Pe W/cm &/or Wo Cm  01/08/2016  CLINICAL DATA:  Status post Cesarean section with wound drainage and leg swelling EXAM: CT ANGIOGRAPHY CHEST CT ABDOMEN AND PELVIS WITH CONTRAST TECHNIQUE: Multidetector CT imaging of the chest was performed using the standard protocol during bolus administration of intravenous contrast. Multiplanar CT image reconstructions and MIPs were obtained to evaluate the vascular anatomy. Multidetector CT imaging of the abdomen and pelvis was performed using the standard protocol during bolus administration of intravenous contrast. CONTRAST:  100 mL Isovue 370. COMPARISON:  01/02/2016 FINDINGS: CTA CHEST FINDINGS The lungs are well aerated bilaterally. Some minimal dependent atelectatic changes are seen. No focal infiltrate or sizable effusion is noted. The thoracic inlet and thoracic aorta are unremarkable. The pulmonary artery is well visualized and demonstrates a normal branching pattern. No filling defects to suggest pulmonary emboli are identified. No significant hilar or mediastinal adenopathy is noted. The breasts are engorged consistent with the  postpartum state. No acute bony abnormality is seen. CT ABDOMEN and PELVIS FINDINGS The gallbladder is contracted. The liver, spleen, adrenal glands and pancreas are within normal limits with the exception of a small cyst within the posterior aspect of the spleen. Kidneys the again demonstrates a left renal cyst. Fullness of the right renal collecting system and ureter is noted without definitive obstructive change. The bladder is partially decompressed by Foley catheter. Air is noted in the bladder consistent with the recent catheter placement. Minimal free pelvic fluid is noted as well as a small amount of free fluid adjacent to the liver tip. There are changes in the uterus consistent with the postpartum state and recent Cesarean section. Adjacent to the C-section line within the myometrium, there is a 9.1 by 7.6 by 10.7 cm mixed attenuation collection consistent with hematoma. It appears slightly larger than that seen on the prior exam. Postsurgical changes in the anterior abdominal wall are seen. Additionally there is a fluid collection anterior to the abdominal musculature which measures 9.2 by 2.3 cm. No air is noted within an this may simply represent a postoperative hematoma. Clinical correlation with the wound drainage is recommended. This collection is new from the prior exam. The appendix is not well visualized. No other focal abnormality is seen. Review of the MIP images confirms the above findings. IMPRESSION: No evidence of pulmonary embolism. Persistent and slightly increased mixed attenuation hematoma adjacent to the Cesarean section scar within the uterus in the right pelvis New fluid collection anterior to the abdominal wall musculature which may simply represent a seroma or hematoma. It is new from the prior exam. The possibility of an underlying abscess could not be totally excluded. Fullness of the right renal collecting system without definitive obstruction. This is likely related to localized  mass effect secondary to the hematoma. Electronically Signed   By: Alcide Clever M.D.   On: 01/08/2016 17:48   Ct Abdomen Pelvis W Contrast  01/08/2016  CLINICAL DATA:  Status post Cesarean section with wound drainage and leg swelling EXAM: CT  ANGIOGRAPHY CHEST CT ABDOMEN AND PELVIS WITH CONTRAST TECHNIQUE: Multidetector CT imaging of the chest was performed using the standard protocol during bolus administration of intravenous contrast. Multiplanar CT image reconstructions and MIPs were obtained to evaluate the vascular anatomy. Multidetector CT imaging of the abdomen and pelvis was performed using the standard protocol during bolus administration of intravenous contrast. CONTRAST:  100 mL Isovue 370. COMPARISON:  01/02/2016 FINDINGS: CTA CHEST FINDINGS The lungs are well aerated bilaterally. Some minimal dependent atelectatic changes are seen. No focal infiltrate or sizable effusion is noted. The thoracic inlet and thoracic aorta are unremarkable. The pulmonary artery is well visualized and demonstrates a normal branching pattern. No filling defects to suggest pulmonary emboli are identified. No significant hilar or mediastinal adenopathy is noted. The breasts are engorged consistent with the postpartum state. No acute bony abnormality is seen. CT ABDOMEN and PELVIS FINDINGS The gallbladder is contracted. The liver, spleen, adrenal glands and pancreas are within normal limits with the exception of a small cyst within the posterior aspect of the spleen. Kidneys the again demonstrates a left renal cyst. Fullness of the right renal collecting system and ureter is noted without definitive obstructive change. The bladder is partially decompressed by Foley catheter. Air is noted in the bladder consistent with the recent catheter placement. Minimal free pelvic fluid is noted as well as a small amount of free fluid adjacent to the liver tip. There are changes in the uterus consistent with the postpartum state and recent  Cesarean section. Adjacent to the C-section line within the myometrium, there is a 9.1 by 7.6 by 10.7 cm mixed attenuation collection consistent with hematoma. It appears slightly larger than that seen on the prior exam. Postsurgical changes in the anterior abdominal wall are seen. Additionally there is a fluid collection anterior to the abdominal musculature which measures 9.2 by 2.3 cm. No air is noted within an this may simply represent a postoperative hematoma. Clinical correlation with the wound drainage is recommended. This collection is new from the prior exam. The appendix is not well visualized. No other focal abnormality is seen. Review of the MIP images confirms the above findings. IMPRESSION: No evidence of pulmonary embolism. Persistent and slightly increased mixed attenuation hematoma adjacent to the Cesarean section scar within the uterus in the right pelvis New fluid collection anterior to the abdominal wall musculature which may simply represent a seroma or hematoma. It is new from the prior exam. The possibility of an underlying abscess could not be totally excluded. Fullness of the right renal collecting system without definitive obstruction. This is likely related to localized mass effect secondary to the hematoma. Electronically Signed   By: Alcide Clever M.D.   On: 01/08/2016 17:48   Ct Abdomen Pelvis W Contrast  01/02/2016  CLINICAL DATA:  Three days postpartum status post C-section, falling hemoglobin, orthostasis, probable suprapubic hematoma. Ileus. EXAM: CT ABDOMEN AND PELVIS WITH CONTRAST TECHNIQUE: Multidetector CT imaging of the abdomen and pelvis was performed using the standard protocol following bolus administration of intravenous contrast. CONTRAST:  OMNIPAQUE IOHEXOL 300 MG/ML  SOLN COMPARISON:  None. FINDINGS: Lower chest:  Lung bases are clear. Hepatobiliary: Liver is within normal limits. Layering gallbladder sludge (series 2/ image 33). No associated inflammatory  changes. Pancreas: Within normal limits. Spleen: 11 mm cyst in the posterior spleen (series 2/ image 22). Adrenals/Urinary Tract: Adrenal glands are within normal limits. Two left renal cysts measuring up to 7 mm in the upper pole (series 2/ image 20). Right  kidney is within normal limits. No hydronephrosis. Bladder is notable for two small foci of nondependent gas (series 2/ image 66), likely related to recent instrumentation. On delayed imaging, there is mild fullness of the right renal collecting system/proximal ureter. No excretory contrast extravasation is seen to suggest ureteral injury. Stomach/Bowel: Stomach is within normal limits. No evidence of bowel obstruction. Vascular/Lymphatic: No evidence of abdominal aortic aneurysm. No suspicious abdominopelvic lymphadenopathy. Reproductive: Enlarged uterus, due to recent gravid state. Postsurgical changes along the anterior aspect of the lower uterine segment related to C-section (sagittal image 62). Other: Scattered foci of gas beneath the anterior abdominal wall (for example, series 2/ image 48), postoperative. Additional postsurgical changes along the anterior abdominal wall (for example, series 2/image 67). 7.1 x 6.1 x 7.6 cm hematoma in the right lower pelvis (series 2/image 65), along the right inferior aspect of the C-section scar. No evidence of active extravasation. Additional trace hemorrhage anteriorly in the right pelvis (series 2/ image 67). Small volume abdominopelvic ascites. Musculoskeletal: Visualized osseous structures are within normal limits. IMPRESSION: Postsurgical changes related to low anterior C-section. 7.1 x 6.1 x 7.6 cm hematoma in the right lower pelvis. No evidence of active extravasation. Mild fullness of the right renal collecting system/proximal ureter. No excretory contrast extravasation to suggest ureteral injury. No evidence of bowel obstruction. Additional ancillary findings as above. Electronically Signed   By: Charline BillsSriyesh   Krishnan M.D.   On: 01/02/2016 12:42   Dg Chest Portable 1 View  01/08/2016  CLINICAL DATA:  Recent C-section with wound drainage EXAM: PORTABLE CHEST 1 VIEW COMPARISON:  02/19/2015 FINDINGS: The heart size and mediastinal contours are within normal limits. Both lungs are clear. The visualized skeletal structures are unremarkable. IMPRESSION: No active disease. Electronically Signed   By: Alcide CleverMark  Lukens M.D.   On: 01/08/2016 16:54   [place] [facility] Ob Follow Up  12/16/2015  OBSTETRICAL ULTRASOUND: This exam was performed within a [place] [facility]. The OB US report was generated in the AS system, and faxed to the ordering physician.  This report is available in the [facility] [employer] PACS. See the AS Obstetric US report via the Image Link.  [place] [facility] Ob Follow Up Addl Gest  12/16/2015  OBSTETRICAL ULTRASOUND: This exam was performed within a [place] [facility]. The OB US report was generated in the AS system, and faxed to the ordering physician.  This report is available in the [facility] [employer] PACS. See the AS Obstetric US report via the Image Link.  [place] [facility] Ob Limited  12/31/2015  OBSTETRICAL ULTRASOUND: This exam was performed within a [place] [facility]. The OB US report was generated in the AS system, and faxed to the ordering physician.  This report is available in the [facility] [employer] PACS. See the AS Obstetric US report via the Image Link.    CBC  Recent Labs Lab 01/08/16 1519 01/08/16 1637 01/09/16 1011 01/10/16 0317 01/11/16 0318  WBC 11.9*  --  12.6* 15.3* 11.3*  HGB 8.6*  --  10.1* 10.4* 10.7*  HCT 26.2*  --  30.4* 32.6* 32.8*  PLT 330 327 281 367 396  MCV 81.6  --  83.3 83.4 84.3  MCH 26.8  --  27.7 26.6 27.5  MCHC 32.8  --  33.2 31.9 32.6  RDW 18.4*  --  17.9* 18.1* 17.9*  LYMPHSABS 2.0  --   --   --   --   MONOABS 0.7  --   --   --   --  EOSABS 0.0  --   --   --   --   BASOSABS 0.0  --   --   --   --     Chemistries   Recent Labs Lab 01/08/16 1519  01/09/16 1011 01/10/16 0317 01/11/16 0318  NA 140 142 140 141  K 3.4* 3.4* 2.9* 3.6  CL 109 111 106 105  CO2 GLUCOSE 117* 100* 105* 93  BUN 9 9 5* 10  CREATININE 0.42* 0.31* 0.36* 0.39*  CALCIUM 8.4* 8.5* 6.8* 8.5*  AST 61* 62*  --   --   ALT 39 41  --   --   ALKPHOS 136* 177*  --   --   BILITOT 1.0 0.9  --   --    ------------------------------------------------------------------------------------------------------------------ estimated creatinine clearance is 94.3 mL/min (by C-G formula based on Cr of 0.39). ------------------------------------------------------------------------------------------------------------------ No results for input(s): HGBA1C in the last 72 hours. ------------------------------------------------------------------------------------------------------------------ No results for input(s): CHOL, HDL, LDLCALC, TRIG, CHOLHDL, LDLDIRECT in the last 72 hours. ------------------------------------------------------------------------------------------------------------------ No results for input(s): TSH, T4TOTAL, T3FREE, THYROIDAB in the last 72 hours.  Invalid input(s): FREET3 ------------------------------------------------------------------------------------------------------------------ No results for input(s): VITAMINB12, FOLATE, FERRITIN, TIBC, IRON, RETICCTPCT in the last 72 hours.  Coagulation profile  Recent Labs Lab 01/08/16 1637  INR 0.93     Recent Labs  01/08/16 1637  DDIMER 3.71*    Cardiac Enzymes  Recent Labs Lab 01/08/16 1759 01/09/16 1011  TROPONINI <0.03 <0.03   ------------------------------------------------------------------------------------------------------------------ Invalid input(s): POCBNP     Time Spent in minutes   30 minutes   ELGERGAWY, DAWOOD M.D on 01/11/2016 at 10:21 AM  Between 7am to 7pm - Pager - [phone]  After 7pm go to [email or web address] - password [email or web address]  [facility]    Office  [phone]

## 2016-01-12 DIAGNOSIS — R600 Localized edema: Secondary | ICD-10-CM | POA: Insufficient documentation

## 2016-01-12 LAB — CBC
HCT: 34 % — ABNORMAL LOW (ref 36.0–46.0)
Hemoglobin: 11.1 g/dL — ABNORMAL LOW (ref 12.0–15.0)
MCH: 27.3 pg (ref 26.0–34.0)
MCHC: 32.6 g/dL (ref 30.0–36.0)
MCV: 83.7 fL (ref 78.0–100.0)
PLATELETS: 480 10*3/uL — AB (ref 150–400)
RBC: 4.06 MIL/uL (ref 3.87–5.11)
RDW: 17.8 % — ABNORMAL HIGH (ref 11.5–15.5)
WBC: 11.9 10*3/uL — ABNORMAL HIGH (ref 4.0–10.5)

## 2016-01-12 LAB — COMPREHENSIVE METABOLIC PANEL
ALK PHOS: 179 U/L — AB (ref 38–126)
ALT: 46 U/L (ref 14–54)
ANION GAP: 9 (ref 5–15)
AST: 82 U/L — ABNORMAL HIGH (ref 15–41)
Albumin: 2.1 g/dL — ABNORMAL LOW (ref 3.5–5.0)
BUN: 10 mg/dL (ref 6–20)
CALCIUM: 8.6 mg/dL — AB (ref 8.9–10.3)
CHLORIDE: 106 mmol/L (ref 101–111)
CO2: 22 mmol/L (ref 22–32)
CREATININE: 0.33 mg/dL — AB (ref 0.44–1.00)
Glucose, Bld: 91 mg/dL (ref 65–99)
Potassium: 4 mmol/L (ref 3.5–5.1)
Sodium: 137 mmol/L (ref 135–145)
Total Bilirubin: 0.9 mg/dL (ref 0.3–1.2)
Total Protein: 5.9 g/dL — ABNORMAL LOW (ref 6.5–8.1)

## 2016-01-12 MED ORDER — BISACODYL 5 MG PO TBEC
10.0000 mg | DELAYED_RELEASE_TABLET | Freq: Once | ORAL | Status: AC
  Administered 2016-01-12: 10 mg via ORAL
  Filled 2016-01-12: qty 2

## 2016-01-12 MED ORDER — FUROSEMIDE 10 MG/ML IJ SOLN
20.0000 mg | Freq: Once | INTRAMUSCULAR | Status: AC
  Administered 2016-01-12: 20 mg via INTRAVENOUS
  Filled 2016-01-12: qty 2

## 2016-01-12 MED ORDER — OXYCODONE HCL 5 MG PO TABS
5.0000 mg | ORAL_TABLET | ORAL | Status: DC | PRN
  Administered 2016-01-12: 10 mg via ORAL
  Administered 2016-01-12: 5 mg via ORAL
  Administered 2016-01-13: 10 mg via ORAL
  Administered 2016-01-13: 5 mg via ORAL
  Administered 2016-01-13: 10 mg via ORAL
  Filled 2016-01-12 (×2): qty 2
  Filled 2016-01-12: qty 1
  Filled 2016-01-12 (×2): qty 2

## 2016-01-12 MED ORDER — OXYCODONE HCL 5 MG PO TABS
5.0000 mg | ORAL_TABLET | Freq: Once | ORAL | Status: AC
  Administered 2016-01-12: 5 mg via ORAL
  Filled 2016-01-12: qty 1

## 2016-01-12 MED ORDER — IBUPROFEN 200 MG PO TABS
600.0000 mg | ORAL_TABLET | Freq: Four times a day (QID) | ORAL | Status: DC | PRN
  Administered 2016-01-12: 600 mg via ORAL
  Filled 2016-01-12: qty 3

## 2016-01-12 MED ORDER — DIPHENHYDRAMINE HCL 25 MG PO CAPS
25.0000 mg | ORAL_CAPSULE | Freq: Once | ORAL | Status: AC
  Administered 2016-01-12: 25 mg via ORAL
  Filled 2016-01-12: qty 1

## 2016-01-12 MED ORDER — HYDRALAZINE HCL 50 MG PO TABS
50.0000 mg | ORAL_TABLET | Freq: Three times a day (TID) | ORAL | Status: DC
  Administered 2016-01-12 – 2016-01-13 (×3): 50 mg via ORAL
  Filled 2016-01-12 (×4): qty 1

## 2016-01-12 MED ORDER — MORPHINE SULFATE (PF) 2 MG/ML IV SOLN: 2.0000 mg | INTRAVENOUS | Status: DC | PRN

## 2016-01-12 MED ORDER — OXYCODONE HCL 5 MG PO TABS
5.0000 mg | ORAL_TABLET | ORAL | Status: DC | PRN
  Administered 2016-01-12 (×3): 5 mg via ORAL
  Filled 2016-01-12 (×3): qty 1

## 2016-01-12 NOTE — Discharge Instructions (Signed)
Cesarean Delivery, Care After  Refer to this sheet in the next few weeks. These instructions provide you with information on caring for yourself after your procedure. Your health care provider may also give you specific instructions. Your treatment has been planned according to current medical practices, but problems sometimes occur. Call your health care provider if you have any problems or questions after you go home.  HOME CARE INSTRUCTIONS   Only take over-the-counter or prescription medications as directed by your health care provider.   Do not drink alcohol, especially if you are breastfeeding or taking medication to relieve pain.   Do not chew or smoke tobacco.   Continue to use good perineal care. Good perineal care includes:    Wiping your perineum from front to back.    Keeping your perineum clean.   Check your surgical cut (incision) daily for increased redness, drainage, swelling, or separation of skin.   Clean your incision gently with soap and water every day, and then pat it dry. If your health care provider says it is okay, leave the incision uncovered. Use a bandage (dressing) if the incision is draining fluid or appears irritated. If the adhesive strips across the incision do not fall off within 7 days, carefully peel them off.   Hug a pillow when coughing or sneezing until your incision is healed. This helps to relieve pain.   Do not use tampons or douche until your health care provider says it is okay.   Shower, wash your hair, and take tub baths as directed by your health care provider.   Wear a well-fitting bra that provides breast support.   Limit wearing support panties or control-top hose.   Drink enough fluids to keep your urine clear or pale yellow.   Eat high-fiber foods such as whole grain cereals and breads, brown rice, beans, and fresh fruits and vegetables every day. These foods may help prevent or relieve constipation.   Resume activities such as climbing stairs,  driving, lifting, exercising, or traveling as directed by your health care provider.   Talk to your health care provider about resuming sexual activities. This is dependent upon your risk of infection, your rate of healing, and your comfort and desire to resume sexual activity.   Try to have someone help you with your household activities and your newborn for at least a few days after you leave the hospital.   Rest as much as possible. Try to rest or take a nap when your newborn is sleeping.   Increase your activities gradually.   Keep all of your scheduled postpartum appointments. It is very important to keep your scheduled follow-up appointments. At these appointments, your health care provider will be checking to make sure that you are healing physically and emotionally.  SEEK MEDICAL CARE IF:    You are passing large clots from your vagina. Save any clots to show your health care provider.   You have a foul smelling discharge from your vagina.   You have trouble urinating.   You are urinating frequently.   You have pain when you urinate.   You have a change in your bowel movements.   You have increasing redness, pain, or swelling near your incision.   You have pus draining from your incision.   Your incision is separating.   You have painful, hard, or reddened breasts.   You have a severe headache.   You have blurred vision or see spots.   You feel sad   or depressed.   You have thoughts of hurting yourself or your newborn.   You have questions about your care, the care of your newborn, or medications.   You are dizzy or light-headed.   You have a rash.   You have pain, redness, or swelling at the site of the removed intravenous access (IV) tube.   You have nausea or vomiting.   You stopped breastfeeding and have not had a menstrual period within 12 weeks of stopping.   You are not breastfeeding and have not had a menstrual period within 12 weeks of delivery.   You have a fever.  SEEK  IMMEDIATE MEDICAL CARE IF:   You have persistent pain.   You have chest pain.   You have shortness of breath.   You faint.   You have leg pain.   You have stomach pain.   Your vaginal bleeding saturates 2 or more sanitary pads in 1 hour.  MAKE SURE YOU:    Understand these instructions.   Will watch your condition.   Will get help right away if you are not doing well or get worse.     This information is not intended to replace advice given to you by your health care provider. Make sure you discuss any questions you have with your health care provider.     Document Released: 06/07/2002 Document Revised: 10/06/2014 Document Reviewed: 05/12/2012  [employer] Interactive Patient Education 2016 [employer]

## 2016-01-12 NOTE — Progress Notes (Signed)
Patient Demographics  April [name], is a 25 y.o. female, DOB - 1991/05/27, UJW:[id number]  Admit date - 01/08/2016   Admitting Physician Shane Crutch, MD  Outpatient Primary MD for the patient is No PCP Per Patient  LOS - 4   Chief Complaint  Patient presents with  . Post-op Problem       Admission HPI/Brief narrative: 25 y/o with PMH of depression, GERD. She underwent a C section on 4/3 for malpresentation of twins. She is given 5 units of blood during the surgery for a drop in Hb to 5.7. Noted to have a large pelvic hematoma on 4/5. Admitted with sepsis giving elevated lactic acid, tachycardia, as well as lower extremity edema, initially on vancomycin and Zosyn, negative septic workup so far, repeat CT abdomen significant for intra-uterine hematoma and abdominal wall hematoma as well. Subjective:   April [name] today has, No headache, No chest pain, complains of abdominal pain, ports appetite is improving, No BM yet.  Assessment & Plan    Active Problems:   S/P cesarean section   Sepsis (HCC)   Hypertension in pregnancy, postpartum condition   Pelvic hematoma, delivered with postpartum complication  SIRS - No clear source of infection, blood cultures with no growth to date, positive urinalysis, but urine culture growing 40,000 colonies of lactobacillus, so unlikely UTI. - OB following regarding C-section wound, unlikely infected. - IV vancomycin stopped 4/13, IV zosyn stopped as well given negative septic work up.  Intrauterine hematoma/abdominal wall hematoma - Monitor H&H closely and transfuse as needed,Hgb remains stable - Received 2 units PRBC on 4/11 - Continue with when necessary pain medicine, will change to Po in anticipation of discgarge tomorrow. - SCD for  DVT prophylaxis, encourage to ambulate. - Management per OB service  Hypertension - Patient does not carry a  diagnosis of hypertension prior To admission - Treated with magnesium sulfate for 24 hours to cover possibility of postpartum preeclampsia - Blood pressure better controlled after starting hydralazine , change dose to 50 mg by mouth 3 times a day.   Lower extremity edema - Significantly improved with diuresis, only trace edema today, will give one dose of Lasix then stop - Lower extremity Doppler negative - EF 55-60%, with normal wall motion  Anemia of acute blood loss - Monitor closely and transfuse as needed  Hypokalemia - Repleted, monitor closely  Code Status: Full  Family Communication: Discussed with fiance at bedside bedside  Disposition Plan: in 24 hours if continues to improve    Procedures  none   Consults   [facility]   Medications  Scheduled Meds: . Chlorhexidine Gluconate Cloth  6 each Topical Q0600  . hydrALAZINE  25 mg Oral 4 times per day  . mupirocin ointment  1 application Nasal BID  . pantoprazole  40 mg Oral QHS  . polyethylene glycol  17 g Oral BID  . prenatal vitamin w/FE, FA  1 tablet Oral Daily  . senna-docusate  2 tablet Oral BID  . sodium chloride  30 mL/kg Intravenous Once   Continuous Infusions:  PRN Meds:.sodium chloride, bisacodyl, hydrALAZINE, ibuprofen, ondansetron (ZOFRAN) IV, oxyCODONE  DVT Prophylaxis  SCDs   Lab Results  Component Value Date   PLT 480* 01/12/2016  Antibiotics   Anti-infectives    Start     Dose/Rate Route Frequency Ordered Stop   01/09/16 0200  vancomycin (VANCOCIN) IVPB 750 mg/150 ml premix  Status:  Discontinued     750 mg 150 mL/hr over 60 Minutes Intravenous Every 8 hours 01/08/16 1833 01/10/16 1122   01/09/16 0000  piperacillin-tazobactam (ZOSYN) IVPB 3.375 g  Status:  Discontinued     3.375 g 12.5 mL/hr over 240 Minutes Intravenous Every 8 hours 01/08/16 1622 01/12/16 0647   01/08/16 1700  vancomycin (VANCOCIN) IVPB 1000 mg/200 mL premix     1,000 mg 200 mL/hr over 60 Minutes  Intravenous  Once 01/08/16 1653 01/08/16 1854   01/08/16 1615  piperacillin-tazobactam (ZOSYN) IVPB 3.375 g     3.375 g 100 mL/hr over 30 Minutes Intravenous STAT 01/08/16 1605 01/08/16 1709          Objective:   Filed Vitals:   01/12/16 0105 01/12/16 0400 01/12/16 0500 01/12/16 0800  BP: 144/89 145/91  143/91  Pulse:  73  86  Temp:  99.1 F (37.3 C)    TempSrc:      Resp:  16  21  Height:      Weight:   62.1 kg (136 lb 14.5 oz)   SpO2:  95%  94%    Wt Readings from Last 3 Encounters:  01/12/16 62.1 kg (136 lb 14.5 oz)  12/30/15 64.139 kg (141 lb 6.4 oz)  12/17/15 63.776 kg (140 lb 9.6 oz)     Intake/Output Summary (Last 24 hours) at 01/12/16 1001 Last data filed at 01/12/16 0900  Gross per 24 hour  Intake   1660 ml  Output   1275 ml  Net    385 ml     Physical Exam  Awake Alert, Oriented X 3,  Supple Neck,No JVD,  Symmetrical Chest wall movement, Good air movement bilaterally, CTAB Tachycardic, No Gallops,Rubs or new Murmurs, No Parasternal Heave +ve B.Sounds, soft, tenderness over right lower quadrant at the right edge of CS scar. No Cyanosis, Clubbing , trace pedal edema, No new Rash or bruise     Data Review   Micro Results Recent Results (from the past 240 hour(s))  Culture, blood (routine x 2)     Status: None (Preliminary result)   Collection Time: 01/08/16  3:18 PM  Result Value Ref Range Status   Specimen Description BLOOD RIGHT ARM  Final   Special Requests BOTTLES DRAWN AEROBIC ONLY  Final   Culture   Final    NO GROWTH 3 DAYS Performed at [facility]    Report Status PENDING  Incomplete  Culture, blood (routine x 2)     Status: None (Preliminary result)   Collection Time: 01/08/16  4:38 PM  Result Value Ref Range Status   Specimen Description BLOOD LEFT FOREARM  Final   Special Requests BOTTLES DRAWN AEROBIC AND ANAEROBIC  Final   Culture   Final    NO GROWTH 3 DAYS Performed at [facility]    Report Status  PENDING  Incomplete  Urine culture     Status: Abnormal   Collection Time: 01/08/16  5:18 PM  Result Value Ref Range Status   Specimen Description URINE, CATHETERIZED  Final   Special Requests NONE  Final   Culture (A)  Final    40,000 COLONIES/mL LACTOBACILLUS SPECIES Standardized susceptibility testing for this organism is not available. Performed at [facility]    Report Status 01/10/2016 FINAL  Final  MRSA PCR Screening     Status: Abnormal   Collection Time: 01/08/16  6:41 PM  Result Value Ref Range Status   MRSA by PCR POSITIVE (A) NEGATIVE Final    Comment:        The GeneXpert MRSA Assay (FDA approved for NASAL specimens only), is one component of a comprehensive MRSA colonization surveillance program. It is not intended to diagnose MRSA infection nor to guide or monitor treatment for MRSA infections. RESULT CALLED TO, READ BACK BY AND VERIFIED WITH: B MCNABB RN 2321 01/08/16 A NAVARRO     Radiology Reports Ct Angio Chest Pe W/cm &/or Wo Cm  01/08/2016  CLINICAL DATA:  Status post Cesarean section with wound drainage and leg swelling EXAM: CT ANGIOGRAPHY CHEST CT ABDOMEN AND PELVIS WITH CONTRAST TECHNIQUE: Multidetector CT imaging of the chest was performed using the standard protocol during bolus administration of intravenous contrast. Multiplanar CT image reconstructions and MIPs were obtained to evaluate the vascular anatomy. Multidetector CT imaging of the abdomen and pelvis was performed using the standard protocol during bolus administration of intravenous contrast. CONTRAST:  100 mL Isovue 370. COMPARISON:  01/02/2016 FINDINGS: CTA CHEST FINDINGS The lungs are well aerated bilaterally. Some minimal dependent atelectatic changes are seen. No focal infiltrate or sizable effusion is noted. The thoracic inlet and thoracic aorta are unremarkable. The pulmonary artery is well visualized and demonstrates a normal branching pattern. No filling defects to suggest  pulmonary emboli are identified. No significant hilar or mediastinal adenopathy is noted. The breasts are engorged consistent with the postpartum state. No acute bony abnormality is seen. CT ABDOMEN and PELVIS FINDINGS The gallbladder is contracted. The liver, spleen, adrenal glands and pancreas are within normal limits with the exception of a small cyst within the posterior aspect of the spleen. Kidneys the again demonstrates a left renal cyst. Fullness of the right renal collecting system and ureter is noted without definitive obstructive change. The bladder is partially decompressed by Foley catheter. Air is noted in the bladder consistent with the recent catheter placement. Minimal free pelvic fluid is noted as well as a small amount of free fluid adjacent to the liver tip. There are changes in the uterus consistent with the postpartum state and recent Cesarean section. Adjacent to the C-section line within the myometrium, there is a 9.1 by 7.6 by 10.7 cm mixed attenuation collection consistent with hematoma. It appears slightly larger than that seen on the prior exam. Postsurgical changes in the anterior abdominal wall are seen. Additionally there is a fluid collection anterior to the abdominal musculature which measures 9.2 by 2.3 cm. No air is noted within an this may simply represent a postoperative hematoma. Clinical correlation with the wound drainage is recommended. This collection is new from the prior exam. The appendix is not well visualized. No other focal abnormality is seen. Review of the MIP images confirms the above findings. IMPRESSION: No evidence of pulmonary embolism. Persistent and slightly increased mixed attenuation hematoma adjacent to the Cesarean section scar within the uterus in the right pelvis New fluid collection anterior to the abdominal wall musculature which may simply represent a seroma or hematoma. It is new from the prior exam. The possibility of an underlying abscess could not  be totally excluded. Fullness of the right renal collecting system without definitive obstruction. This is likely related to localized mass effect secondary to the hematoma. Electronically Signed   By: Alcide Clever M.D.   On: 01/08/2016 17:48   Ct  Abdomen Pelvis W Contrast  01/08/2016  CLINICAL DATA:  Status post Cesarean section with wound drainage and leg swelling EXAM: CT ANGIOGRAPHY CHEST CT ABDOMEN AND PELVIS WITH CONTRAST TECHNIQUE: Multidetector CT imaging of the chest was performed using the standard protocol during bolus administration of intravenous contrast. Multiplanar CT image reconstructions and MIPs were obtained to evaluate the vascular anatomy. Multidetector CT imaging of the abdomen and pelvis was performed using the standard protocol during bolus administration of intravenous contrast. CONTRAST:  100 mL Isovue 370. COMPARISON:  01/02/2016 FINDINGS: CTA CHEST FINDINGS The lungs are well aerated bilaterally. Some minimal dependent atelectatic changes are seen. No focal infiltrate or sizable effusion is noted. The thoracic inlet and thoracic aorta are unremarkable. The pulmonary artery is well visualized and demonstrates a normal branching pattern. No filling defects to suggest pulmonary emboli are identified. No significant hilar or mediastinal adenopathy is noted. The breasts are engorged consistent with the postpartum state. No acute bony abnormality is seen. CT ABDOMEN and PELVIS FINDINGS The gallbladder is contracted. The liver, spleen, adrenal glands and pancreas are within normal limits with the exception of a small cyst within the posterior aspect of the spleen. Kidneys the again demonstrates a left renal cyst. Fullness of the right renal collecting system and ureter is noted without definitive obstructive change. The bladder is partially decompressed by Foley catheter. Air is noted in the bladder consistent with the recent catheter placement. Minimal free pelvic fluid is noted as well as  a small amount of free fluid adjacent to the liver tip. There are changes in the uterus consistent with the postpartum state and recent Cesarean section. Adjacent to the C-section line within the myometrium, there is a 9.1 by 7.6 by 10.7 cm mixed attenuation collection consistent with hematoma. It appears slightly larger than that seen on the prior exam. Postsurgical changes in the anterior abdominal wall are seen. Additionally there is a fluid collection anterior to the abdominal musculature which measures 9.2 by 2.3 cm. No air is noted within an this may simply represent a postoperative hematoma. Clinical correlation with the wound drainage is recommended. This collection is new from the prior exam. The appendix is not well visualized. No other focal abnormality is seen. Review of the MIP images confirms the above findings. IMPRESSION: No evidence of pulmonary embolism. Persistent and slightly increased mixed attenuation hematoma adjacent to the Cesarean section scar within the uterus in the right pelvis New fluid collection anterior to the abdominal wall musculature which may simply represent a seroma or hematoma. It is new from the prior exam. The possibility of an underlying abscess could not be totally excluded. Fullness of the right renal collecting system without definitive obstruction. This is likely related to localized mass effect secondary to the hematoma. Electronically Signed   By: Alcide Clever M.D.   On: 01/08/2016 17:48   Ct Abdomen Pelvis W Contrast  01/02/2016  CLINICAL DATA:  Three days postpartum status post C-section, falling hemoglobin, orthostasis, probable suprapubic hematoma. Ileus. EXAM: CT ABDOMEN AND PELVIS WITH CONTRAST TECHNIQUE: Multidetector CT imaging of the abdomen and pelvis was performed using the standard protocol following bolus administration of intravenous contrast. CONTRAST:  OMNIPAQUE IOHEXOL 300 MG/ML  SOLN COMPARISON:  None. FINDINGS: Lower chest:  Lung bases are  clear. Hepatobiliary: Liver is within normal limits. Layering gallbladder sludge (series 2/ image 33). No associated inflammatory changes. Pancreas: Within normal limits. Spleen: 11 mm cyst in the posterior spleen (series 2/ image 22). Adrenals/Urinary Tract: Adrenal glands  are within normal limits. Two left renal cysts measuring up to 7 mm in the upper pole (series 2/ image 20). Right kidney is within normal limits. No hydronephrosis. Bladder is notable for two small foci of nondependent gas (series 2/ image 66), likely related to recent instrumentation. On delayed imaging, there is mild fullness of the right renal collecting system/proximal ureter. No excretory contrast extravasation is seen to suggest ureteral injury. Stomach/Bowel: Stomach is within normal limits. No evidence of bowel obstruction. Vascular/Lymphatic: No evidence of abdominal aortic aneurysm. No suspicious abdominopelvic lymphadenopathy. Reproductive: Enlarged uterus, due to recent gravid state. Postsurgical changes along the anterior aspect of the lower uterine segment related to C-section (sagittal image 62). Other: Scattered foci of gas beneath the anterior abdominal wall (for example, series 2/ image 48), postoperative. Additional postsurgical changes along the anterior abdominal wall (for example, series 2/image 67). 7.1 x 6.1 x 7.6 cm hematoma in the right lower pelvis (series 2/image 65), along the right inferior aspect of the C-section scar. No evidence of active extravasation. Additional trace hemorrhage anteriorly in the right pelvis (series 2/ image 67). Small volume abdominopelvic ascites. Musculoskeletal: Visualized osseous structures are within normal limits. IMPRESSION: Postsurgical changes related to low anterior C-section. 7.1 x 6.1 x 7.6 cm hematoma in the right lower pelvis. No evidence of active extravasation. Mild fullness of the right renal collecting system/proximal ureter. No excretory contrast extravasation to suggest  ureteral injury. No evidence of bowel obstruction. Additional ancillary findings as above. Electronically Signed   By: Charline Bills M.D.   On: 01/02/2016 12:42   Dg Chest Portable 1 View  01/08/2016  CLINICAL DATA:  Recent C-section with wound drainage EXAM: PORTABLE CHEST 1 VIEW COMPARISON:  02/19/2015 FINDINGS: The heart size and mediastinal contours are within normal limits. Both lungs are clear. The visualized skeletal structures are unremarkable. IMPRESSION: No active disease. Electronically Signed   By: Alcide Clever M.D.   On: 01/08/2016 16:54   [place] Mfm Ob Follow Up  12/16/2015  OBSTETRICAL ULTRASOUND: This exam was performed within a [place] [facility]. The OB US report was generated in the AS system, and faxed to the ordering physician.  This report is available in the [employer]. See the AS Obstetric US report via the Image Link.  [place] Mfm Ob Follow Up Addl Gest  12/16/2015  OBSTETRICAL ULTRASOUND: This exam was performed within a [place] [facility]. The OB US report was generated in the AS system, and faxed to the ordering physician.  This report is available in the [employer]. See the AS Obstetric US report via the Image Link.  [place] Mfm Ob Limited  12/31/2015  OBSTETRICAL ULTRASOUND: This exam was performed within a [place] [facility]. The OB US report was generated in the AS system, and faxed to the ordering physician.  This report is available in the [employer]. See the AS Obstetric US report via the Image Link.    CBC  Recent Labs Lab 01/08/16 1519 01/08/16 1637 01/09/16 1011 01/10/16 0317 01/11/16 0318 01/12/16 0319  WBC 11.9*  --  12.6* 15.3* 11.3* 11.9*  HGB 8.6*  --  10.1* 10.4* 10.7* 11.1*  HCT 26.2*  --  30.4* 32.6* 32.8* 34.0*  PLT 330 327 281 367 396 480*  MCV 81.6  --  83.3 83.4 84.3 83.7  MCH 26.8  --  27.7 26.6 27.5 27.3  MCHC 32.8  --  33.2 31.9 32.6 32.6  RDW 18.4*  --  17.9* 18.1*  17.9* 17.8*  LYMPHSABS 2.0   --   --   --   --   --   MONOABS 0.7  --   --   --   --   --   EOSABS 0.0  --   --   --   --   --   BASOSABS 0.0  --   --   --   --   --     Chemistries   Recent Labs Lab 01/08/16 1519 01/09/16 1011 01/10/16 0317 01/11/16 0318 01/12/16 0319  NA 140 142 140 141 137  K 3.4* 3.4* 2.9* 3.6 4.0  CL 109 111 106 105 106  CO2 22 22 24 25 22   GLUCOSE 117* 100* 105* 93 91  BUN 9 9 5* 10 10  CREATININE 0.42* 0.31* 0.36* 0.39* 0.33*  CALCIUM 8.4* 8.5* 6.8* 8.5* 8.6*  AST 61* 62*  --   --  82*  ALT 39 41  --   --  46  ALKPHOS 136* 177*  --   --  179*  BILITOT 1.0 0.9  --   --  0.9   ------------------------------------------------------------------------------------------------------------------ estimated creatinine clearance is 92.4 mL/min (by C-G formula based on Cr of 0.33). ------------------------------------------------------------------------------------------------------------------ No results for input(s): HGBA1C in the last 72 hours. ------------------------------------------------------------------------------------------------------------------ No results for input(s): CHOL, HDL, LDLCALC, TRIG, CHOLHDL, LDLDIRECT in the last 72 hours. ------------------------------------------------------------------------------------------------------------------ No results for input(s): TSH, T4TOTAL, T3FREE, THYROIDAB in the last 72 hours.  Invalid input(s): FREET3 ------------------------------------------------------------------------------------------------------------------ No results for input(s): VITAMINB12, FOLATE, FERRITIN, TIBC, IRON, RETICCTPCT in the last 72 hours.  Coagulation profile  Recent Labs Lab 01/08/16 1637  INR 0.93    No results for input(s): DDIMER in the last 72 hours.  Cardiac Enzymes  Recent Labs Lab 01/08/16 1759 01/09/16 1011  TROPONINI <0.03 <0.03    ------------------------------------------------------------------------------------------------------------------ Invalid input(s): POCBNP     Time Spent in minutes   25 minutes   Melesa Lecy M.D on 01/12/2016 at 10:01 AM  Between 7am to 7pm - Pager - [phone]  After 7pm go to [email or web address] - password [email or web address]  [facility]   Office  [phone]

## 2016-01-12 NOTE — Progress Notes (Signed)
Post op day #12 s/p LTCS complicated with post op hematoma   Subjective: Patient reports tolerating PO and + BM.  Better appetite today.  Pain improved.  Pt was not ambulatory yesterday.    Objective: I have reviewed patient's vital signs, intake and output, medications, labs, microbiology and radiology results. BP 143/91 mmHg  Pulse 86  Temp(Src) 99.1 F (37.3 C) (Oral)  Resp 21  Ht 5\' 1"  (1.549 m)  Wt 136 lb 14.5 oz (62.1 kg)  BMI 25.88 kg/m2  SpO2 94% I/O last 3 completed shifts: In: 1980 [P.O.:1780; IV Piggyback:200] Out: 5575 [Urine:5575] Total I/O In: 10 [Other:10] Out: -   General: alert and no distress Resp: clear to auscultation bilaterally Cardio: regular rate and rhythm, S1, S2 normal, no murmur, click, rub or gallop GI: appropriately tender.  No rebound or guarding.  tenderness worse on right side as expected Extremities: extremities normal, atraumatic, no cyanosis or edema  CBC Latest Ref Rng 01/12/2016 01/11/2016 01/10/2016  WBC 4.0 - 10.5 K/uL 11.9(H) 11.3(H) 15.3(H)  Hemoglobin 12.0 - 15.0 g/dL 11.1(L) 10.7(L) 10.4(L)  Hematocrit 36.0 - 46.0 % 34.0(L) 32.8(L) 32.6(L)  Platelets 150 - 400 K/uL 480(H) 396 367    Assessment: S/p Post op day #12 s/p LTCS complicated by wound hematoma.  Pt is progressing well and tolerating diet.  Low grade temp expected with hematoma. Pt is stable and from a GYN standpoint may be discharged once ambulatory Will schedule f/u appt at GYN ofc- they will contact her with an appt.   Plan: Encourage ambulation  Remove foley cath D/C IV pain meds Motrin and Percocet prn pain       LOS: 4 days    HARRAWAY-SMITH, Aravind Chrismer 01/12/2016, 9:25 AM

## 2016-01-13 DIAGNOSIS — R6510 Systemic inflammatory response syndrome (SIRS) of non-infectious origin without acute organ dysfunction: Secondary | ICD-10-CM

## 2016-01-13 LAB — BASIC METABOLIC PANEL
ANION GAP: 9 (ref 5–15)
BUN: 14 mg/dL (ref 6–20)
CHLORIDE: 107 mmol/L (ref 101–111)
CO2: 23 mmol/L (ref 22–32)
CREATININE: 0.45 mg/dL (ref 0.44–1.00)
Calcium: 9 mg/dL (ref 8.9–10.3)
GFR calc non Af Amer: >60 mL/min (ref >60)
Glucose, Bld: 105 mg/dL — ABNORMAL HIGH (ref 65–99)
POTASSIUM: 3.8 mmol/L (ref 3.5–5.1)
SODIUM: 139 mmol/L (ref 135–145)

## 2016-01-13 LAB — CBC
HCT: 35.5 % — ABNORMAL LOW (ref 36.0–46.0)
HEMOGLOBIN: 11.5 g/dL — AB (ref 12.0–15.0)
MCH: 26.6 pg (ref 26.0–34.0)
MCHC: 32.4 g/dL (ref 30.0–36.0)
MCV: 82.2 fL (ref 78.0–100.0)
Platelets: 482 10*3/uL — ABNORMAL HIGH (ref 150–400)
RBC: 4.32 MIL/uL (ref 3.87–5.11)
RDW: 17.5 % — ABNORMAL HIGH (ref 11.5–15.5)
WBC: 10.5 10*3/uL (ref 4.0–10.5)

## 2016-01-13 LAB — CULTURE, BLOOD (ROUTINE X 2)
CULTURE: NO GROWTH
Culture: NO GROWTH

## 2016-01-13 MED ORDER — OXYCODONE HCL 5 MG PO TABS: 5.0000 mg | ORAL_TABLET | Freq: Four times a day (QID) | ORAL | Status: DC | PRN

## 2016-01-13 MED ORDER — HYDRALAZINE HCL 50 MG PO TABS: 50.0000 mg | ORAL_TABLET | Freq: Three times a day (TID) | ORAL | Status: DC

## 2016-01-13 MED ORDER — OXYCODONE HCL 5 MG PO TABS
5.0000 mg | ORAL_TABLET | Freq: Once | ORAL | Status: AC
  Administered 2016-01-13: 5 mg via ORAL
  Filled 2016-01-13: qty 1

## 2016-01-13 NOTE — Progress Notes (Signed)
At 0015, patient's HR was sustaining in the 130's. Patient was just lying in the bed. BP was 130/79. Pain was 7/10. NP notified and new orders were given for a one time dose of oxycodone 5mg . A little over an hr after oxy was given HR went into the 100's.

## 2016-01-13 NOTE — Progress Notes (Signed)
Completed D/C teaching. Gave prescriptions. Answered all questions. Patient will be D/C home with family in stable condition. 

## 2016-01-13 NOTE — Discharge Summary (Signed)
April [name], is a 25 y.o. female  DOB [birth year]  MRN [id number].  Admission date:  01/08/2016  Admitting Physician  Shane Crutch, MD  Discharge Date:  01/13/2016   Primary MD  No PCP Per Patient  Recommendations for primary care physician for things to follow:  - will follow with [facility], check CBC, BMP during visit. - Reassess need for antihypertensive medication needs during next visit  Admission Diagnosis  Abdominal distention [R14.0] Lower abdominal pain [R10.30] Lower leg edema [R60.0] Sepsis, due to unspecified organism [facility]) [A41.9]   Discharge Diagnosis  Abdominal distention [R14.0] Lower abdominal pain [R10.30] Lower leg edema [R60.0] Sepsis, due to unspecified organism (HCC) [A41.9]   Active Problems:   S/P cesarean section   Sepsis (HCC)   Hypertension in pregnancy, postpartum condition   Pelvic hematoma, delivered with postpartum complication   Lower leg edema      Past Medical History  Diagnosis Date  . Depression   . GERD (gastroesophageal reflux disease)   . UTI (urinary tract infection) in pregnancy in first trimester   . Herpes   . Infection     UTI    Past Surgical History  Procedure Laterality Date  . Dilation and curettage of uterus    . Cesarean section N/A 12/31/2015    Procedure: CESAREAN SECTION;  Surgeon: Catalina Antigua, MD;  Location: [facility];  Service: Obstetrics;  Laterality: N/A;       History of present illness and  Hospital Course:     Kindly see H&P for history of present illness and admission details, please review complete Labs, Consult reports and Test reports for all details in brief  HPI  from the history and physical done on the day of admission 01/08/2016 April [name] is a 25 y/o with PMH of depression, GERD. She underwent a C section on 4/3 for malpresentation of twins. She is given 5 units of blood during the  surgery for a drop in Hb to 5.7. Noted to have a large pelvic hematoma on 4/5.  She presents to ED on 4/11 with abdominal pain, drainage from the incision site, burning sensation on urination. She had an episode of vomiting and nausea yesterday but denies any GI symptoms right now. She has constipation and is finding it difficult to pass stools.  Code sepsis called for elevated LA and tachycardia. Concern for impending volume overload as she has LE edema. In ED she was started on vanco, zosyn and started on 300cc/kg fluid bolus. She was also given morphine for pain and 2 gm Mg for elevated BP. 2 units blood ordered. PCCM called to admit the patient.  Hospital Course  25 y/o with PMH of depression, GERD. She underwent a C section on 4/3 for malpresentation of twins. She is given 5 units of blood during the surgery for a drop in Hb to 5.7. Noted to have a large pelvic hematoma on 4/5. Admitted with sepsis giving elevated lactic acid, tachycardia, as well as lower extremity edema, initially on vancomycin  and Zosyn, negative septic workup so far, repeat CT abdomen significant for intra-uterine hematoma and abdominal wall hematoma as well.   SIRS - No clear evidence of infection, blood cultures with no growth to date, positive urinalysis, but urine culture growing 40,000 colonies of lactobacillus, so unlikely UTI. - This is most likely related to significant anemia secondary to abdominal wall/ventral uterine hematoma. - OB following regarding C-section wound, unlikely infected. - IV vancomycin stopped 4/13, IV zosyn stopped as well given negative septic work up, further medial antibiotics on discharge - Agent remains tachycardic with impression on discharge, but this is secondary to deconditioning.  Intrauterine hematoma/abdominal wall hematoma - Monitor H&H closely and transfuse as needed,Hgb remains stable - Received 2 units PRBC on 4/11 - Management per OB/GYN service - Hemoglobin stable during  hospital stay, 11.5 on discharge - Admit to continue with when necessary oxycodone for pain, given a prescription for total of 20 tablets 0-5 mg every 6 hours as needed for pain - Patient was on SCD for DVT prophylaxis during hospital stay  Hypertension - Patient does not carry a diagnosis of hypertension prior To admission - Treated with magnesium sulfate for 24 hours to cover possibility of postpartum preeclampsia - Blood pressure better controlled after starting hydralazine , will discharge and hydralazine 50 mg oral 3 times daily, hopefully this can be stopped as she does not carry a diagnosis of hypertension. To admission, this will be reassessed during [facility] clinic visit as discussed with Dr. Katrinka Blazing.   Lower extremity edema - Significantly improved with diuresis, resolved at time of discharge, no further need for diuresis on discharge - Lower extremity Doppler negative - EF 55-60%, with normal wall motion   Anemia of acute blood loss - Hemoglobin 11.5 by day of discharge  Hypokalemia - Repleted,  Discharge Condition:  Stable   Follow UP  Follow-up Information    Follow up with [facility] In 2 weeks.   Contact information:   [address] [place] [address] [phone]        Discharge Instructions  and  Discharge Medications     Discharge Instructions    Discharge instructions    Complete by:  As directed   Follow with [facility]   Get CBC, CMP,  checked  by Primary MD next visit.    Activity: As tolerated with Full fall precautions use walker/cane & assistance as needed   Disposition Home    Diet: Regular diet , with feeding assistance and aspiration precautions.  For Heart failure patients - Check your Weight same time everyday, if you gain over 2 pounds, or you develop in leg swelling, experience more shortness of breath or chest pain, call your Primary MD immediately. Follow Cardiac Low Salt  Diet and 1.5 lit/day fluid restriction.   On your next visit with your primary care physician please Get Medicines reviewed and adjusted.   Please request your Prim.MD to go over all Hospital Tests and Procedure/Radiological results at the follow up, please get all Hospital records sent to your Prim MD by signing hospital release before you go home.   If you experience worsening of your admission symptoms, develop shortness of breath, life threatening emergency, suicidal or homicidal thoughts you must seek medical attention immediately by calling 911 or calling your MD immediately  if symptoms less severe.  You Must read complete instructions/literature along with all the possible adverse reactions/side effects for all the Medicines you take and that  have been prescribed to you. Take any new Medicines after you have completely understood and accpet all the possible adverse reactions/side effects.   Do not drive, operating heavy machinery, perform activities at heights, swimming or participation in water activities or provide baby sitting services if your were admitted for syncope or siezures until you have seen by Primary MD or a Neurologist and advised to do so again.  Do not drive when taking Pain medications.    Do not take more than prescribed Pain, Sleep and Anxiety Medications  Special Instructions: If you have smoked or chewed Tobacco  in the last 2 yrs please stop smoking, stop any regular Alcohol  and or any Recreational drug use.  Wear Seat belts while driving.   Please note  You were cared for by a hospitalist during your hospital stay. If you have any questions about your discharge medications or the care you received while you were in the hospital after you are discharged, you can call the unit and asked to speak with the hospitalist on call if the hospitalist that took care of you is not available. Once you are discharged, your primary care physician will handle any further  medical issues. Please note that NO REFILLS for any discharge medications will be authorized once you are discharged, as it is imperative that you return to your primary care physician (or establish a relationship with a primary care physician if you do not have one) for your aftercare needs so that they can reassess your need for medications and monitor your lab values.            Medication List    STOP taking these medications        oxyCODONE-acetaminophen 10-325 MG tablet  Commonly known as:  PERCOCET     pantoprazole 40 MG tablet  Commonly known as:  PROTONIX      TAKE these medications        acyclovir 200 MG capsule  Commonly known as:  ZOVIRAX  Take 400 mg by mouth 3 (three) times daily as needed (for outbreaks).     [employer] PRENATAL GUMMY 0.4-113.5 MG Chew  Chew 2 each by mouth daily.     hydrALAZINE 50 MG tablet  Commonly known as:  APRESOLINE  Take 1 tablet (50 mg total) by mouth every 8 (eight) hours.     ibuprofen 600 MG tablet  Commonly known as:  ADVIL,MOTRIN  Take 1 tablet (600 mg total) by mouth every 6 (six) hours.     oxyCODONE 5 MG immediate release tablet  Commonly known as:  Oxy IR/ROXICODONE  Take 1 tablet (5 mg total) by mouth every 6 (six) hours as needed for severe pain.          Diet and Activity recommendation: See Discharge Instructions above   Consults obtained -  [facility]  Major procedures and Radiology Reports - PLEASE review detailed and final reports for all details, in brief -   2 units PRBC transfusion   Ct Angio Chest Pe W/cm &/or Wo Cm  01/08/2016  CLINICAL DATA:  Status post Cesarean section with wound drainage and leg swelling EXAM: CT ANGIOGRAPHY CHEST CT ABDOMEN AND PELVIS WITH CONTRAST TECHNIQUE: Multidetector CT imaging of the chest was performed using the standard protocol during bolus administration of intravenous contrast. Multiplanar CT image reconstructions and MIPs were obtained to evaluate the  vascular anatomy. Multidetector CT imaging of the abdomen and pelvis was performed using the standard protocol during bolus  administration of intravenous contrast. CONTRAST:  100 mL Isovue 370. COMPARISON:  01/02/2016 FINDINGS: CTA CHEST FINDINGS The lungs are well aerated bilaterally. Some minimal dependent atelectatic changes are seen. No focal infiltrate or sizable effusion is noted. The thoracic inlet and thoracic aorta are unremarkable. The pulmonary artery is well visualized and demonstrates a normal branching pattern. No filling defects to suggest pulmonary emboli are identified. No significant hilar or mediastinal adenopathy is noted. The breasts are engorged consistent with the postpartum state. No acute bony abnormality is seen. CT ABDOMEN and PELVIS FINDINGS The gallbladder is contracted. The liver, spleen, adrenal glands and pancreas are within normal limits with the exception of a small cyst within the posterior aspect of the spleen. Kidneys the again demonstrates a left renal cyst. Fullness of the right renal collecting system and ureter is noted without definitive obstructive change. The bladder is partially decompressed by Foley catheter. Air is noted in the bladder consistent with the recent catheter placement. Minimal free pelvic fluid is noted as well as a small amount of free fluid adjacent to the liver tip. There are changes in the uterus consistent with the postpartum state and recent Cesarean section. Adjacent to the C-section line within the myometrium, there is a 9.1 by 7.6 by 10.7 cm mixed attenuation collection consistent with hematoma. It appears slightly larger than that seen on the prior exam. Postsurgical changes in the anterior abdominal wall are seen. Additionally there is a fluid collection anterior to the abdominal musculature which measures 9.2 by 2.3 cm. No air is noted within an this may simply represent a postoperative hematoma. Clinical correlation with the wound drainage is  recommended. This collection is new from the prior exam. The appendix is not well visualized. No other focal abnormality is seen. Review of the MIP images confirms the above findings. IMPRESSION: No evidence of pulmonary embolism. Persistent and slightly increased mixed attenuation hematoma adjacent to the Cesarean section scar within the uterus in the right pelvis New fluid collection anterior to the abdominal wall musculature which may simply represent a seroma or hematoma. It is new from the prior exam. The possibility of an underlying abscess could not be totally excluded. Fullness of the right renal collecting system without definitive obstruction. This is likely related to localized mass effect secondary to the hematoma. Electronically Signed   By: Alcide Clever M.D.   On: 01/08/2016 17:48   Ct Abdomen Pelvis W Contrast  01/08/2016  CLINICAL DATA:  Status post Cesarean section with wound drainage and leg swelling EXAM: CT ANGIOGRAPHY CHEST CT ABDOMEN AND PELVIS WITH CONTRAST TECHNIQUE: Multidetector CT imaging of the chest was performed using the standard protocol during bolus administration of intravenous contrast. Multiplanar CT image reconstructions and MIPs were obtained to evaluate the vascular anatomy. Multidetector CT imaging of the abdomen and pelvis was performed using the standard protocol during bolus administration of intravenous contrast. CONTRAST:  100 mL Isovue 370. COMPARISON:  01/02/2016 FINDINGS: CTA CHEST FINDINGS The lungs are well aerated bilaterally. Some minimal dependent atelectatic changes are seen. No focal infiltrate or sizable effusion is noted. The thoracic inlet and thoracic aorta are unremarkable. The pulmonary artery is well visualized and demonstrates a normal branching pattern. No filling defects to suggest pulmonary emboli are identified. No significant hilar or mediastinal adenopathy is noted. The breasts are engorged consistent with the postpartum state. No acute bony  abnormality is seen. CT ABDOMEN and PELVIS FINDINGS The gallbladder is contracted. The liver, spleen, adrenal glands and pancreas are within  normal limits with the exception of a small cyst within the posterior aspect of the spleen. Kidneys the again demonstrates a left renal cyst. Fullness of the right renal collecting system and ureter is noted without definitive obstructive change. The bladder is partially decompressed by Foley catheter. Air is noted in the bladder consistent with the recent catheter placement. Minimal free pelvic fluid is noted as well as a small amount of free fluid adjacent to the liver tip. There are changes in the uterus consistent with the postpartum state and recent Cesarean section. Adjacent to the C-section line within the myometrium, there is a 9.1 by 7.6 by 10.7 cm mixed attenuation collection consistent with hematoma. It appears slightly larger than that seen on the prior exam. Postsurgical changes in the anterior abdominal wall are seen. Additionally there is a fluid collection anterior to the abdominal musculature which measures 9.2 by 2.3 cm. No air is noted within an this may simply represent a postoperative hematoma. Clinical correlation with the wound drainage is recommended. This collection is new from the prior exam. The appendix is not well visualized. No other focal abnormality is seen. Review of the MIP images confirms the above findings. IMPRESSION: No evidence of pulmonary embolism. Persistent and slightly increased mixed attenuation hematoma adjacent to the Cesarean section scar within the uterus in the right pelvis New fluid collection anterior to the abdominal wall musculature which may simply represent a seroma or hematoma. It is new from the prior exam. The possibility of an underlying abscess could not be totally excluded. Fullness of the right renal collecting system without definitive obstruction. This is likely related to localized mass effect secondary to the  hematoma. Electronically Signed   By: Alcide Clever M.D.   On: 01/08/2016 17:48   Ct Abdomen Pelvis W Contrast  01/02/2016  CLINICAL DATA:  Three days postpartum status post C-section, falling hemoglobin, orthostasis, probable suprapubic hematoma. Ileus. EXAM: CT ABDOMEN AND PELVIS WITH CONTRAST TECHNIQUE: Multidetector CT imaging of the abdomen and pelvis was performed using the standard protocol following bolus administration of intravenous contrast. CONTRAST:  OMNIPAQUE IOHEXOL 300 MG/ML  SOLN COMPARISON:  None. FINDINGS: Lower chest:  Lung bases are clear. Hepatobiliary: Liver is within normal limits. Layering gallbladder sludge (series 2/ image 33). No associated inflammatory changes. Pancreas: Within normal limits. Spleen: 11 mm cyst in the posterior spleen (series 2/ image 22). Adrenals/Urinary Tract: Adrenal glands are within normal limits. Two left renal cysts measuring up to 7 mm in the upper pole (series 2/ image 20). Right kidney is within normal limits. No hydronephrosis. Bladder is notable for two small foci of nondependent gas (series 2/ image 66), likely related to recent instrumentation. On delayed imaging, there is mild fullness of the right renal collecting system/proximal ureter. No excretory contrast extravasation is seen to suggest ureteral injury. Stomach/Bowel: Stomach is within normal limits. No evidence of bowel obstruction. Vascular/Lymphatic: No evidence of abdominal aortic aneurysm. No suspicious abdominopelvic lymphadenopathy. Reproductive: Enlarged uterus, due to recent gravid state. Postsurgical changes along the anterior aspect of the lower uterine segment related to C-section (sagittal image 62). Other: Scattered foci of gas beneath the anterior abdominal wall (for example, series 2/ image 48), postoperative. Additional postsurgical changes along the anterior abdominal wall (for example, series 2/image 67). 7.1 x 6.1 x 7.6 cm hematoma in the right lower pelvis (series 2/image  65), along the right inferior aspect of the C-section scar. No evidence of active extravasation. Additional trace hemorrhage anteriorly in the right pelvis (  series 2/ image 67). Small volume abdominopelvic ascites. Musculoskeletal: Visualized osseous structures are within normal limits. IMPRESSION: Postsurgical changes related to low anterior C-section. 7.1 x 6.1 x 7.6 cm hematoma in the right lower pelvis. No evidence of active extravasation. Mild fullness of the right renal collecting system/proximal ureter. No excretory contrast extravasation to suggest ureteral injury. No evidence of bowel obstruction. Additional ancillary findings as above. Electronically Signed   By: Charline Bills M.D.   On: 01/02/2016 12:42   Dg Chest Portable 1 View  01/08/2016  CLINICAL DATA:  Recent C-section with wound drainage EXAM: PORTABLE CHEST 1 VIEW COMPARISON:  02/19/2015 FINDINGS: The heart size and mediastinal contours are within normal limits. Both lungs are clear. The visualized skeletal structures are unremarkable. IMPRESSION: No active disease. Electronically Signed   By: Alcide Clever M.D.   On: 01/08/2016 16:54   [place] Mfm Ob Limited  12/31/2015  OBSTETRICAL ULTRASOUND: This exam was performed within a [facility] Department. The OB US report was generated in the AS system, and faxed to the ordering physician.  This report is available in the [employer]. See the [facility] Obstetric US report via the Image Link.   Micro Results     Recent Results (from the past 240 hour(s))  Culture, blood (routine x 2)     Status: None (Preliminary result)   Collection Time: 01/08/16  3:18 PM  Result Value Ref Range Status   Specimen Description BLOOD RIGHT ARM  Final   Special Requests BOTTLES DRAWN AEROBIC ONLY  Final   Culture   Final    NO GROWTH 4 DAYS Performed at [facility]    Report Status PENDING  Incomplete  Culture, blood (routine x 2)     Status: None (Preliminary result)   Collection  Time: 01/08/16  4:38 PM  Result Value Ref Range Status   Specimen Description BLOOD LEFT FOREARM  Final   Special Requests BOTTLES DRAWN AEROBIC AND ANAEROBIC  Final   Culture   Final    NO GROWTH 4 DAYS Performed at [facility]    Report Status PENDING  Incomplete  Urine culture     Status: Abnormal   Collection Time: 01/08/16  5:18 PM  Result Value Ref Range Status   Specimen Description URINE, CATHETERIZED  Final   Special Requests NONE  Final   Culture (A)  Final    40,000 COLONIES/mL LACTOBACILLUS SPECIES Standardized susceptibility testing for this organism is not available. Performed at [facility]    Report Status 01/10/2016 FINAL  Final  MRSA PCR Screening     Status: Abnormal   Collection Time: 01/08/16  6:41 PM  Result Value Ref Range Status   MRSA by PCR POSITIVE (A) NEGATIVE Final    Comment:        The GeneXpert MRSA Assay (FDA approved for NASAL specimens only), is one component of a comprehensive MRSA colonization surveillance program. It is not intended to diagnose MRSA infection nor to guide or monitor treatment for MRSA infections. RESULT CALLED TO, READ BACK BY AND VERIFIED WITH: B MCNABB RN 2321 01/08/16 A NAVARRO        Today   Subjective:   Zigmund Gottron today has no headache,no chest pain,Abdominal pain significantly improved, patient was able to ambulate on the hallway yesterday , had to be in over last 24 hours .  Objective:   Blood pressure 120/77, pulse 92, temperature 98.4 F (36.9 C), temperature source Oral, resp.  rate 20, height 5\' 1"  (1.549 m), weight 57.063 kg (125 lb 12.8 oz), SpO2 97 %, unknown if currently breastfeeding.   Intake/Output Summary (Last 24 hours) at 01/13/16 1058 Last data filed at 01/12/16 1600  Gross per 24 hour  Intake    500 ml  Output    200 ml  Net    300 ml    Exam Awake Alert, Oriented X 3,  Supple Neck,No JVD,  Symmetrical Chest wall movement, Good air movement  bilaterally, CTAB Tachycardic, No Gallops,Rubs or new Murmurs, No Parasternal Heave +ve B.Sounds, soft, tenderness over right lower quadrant at the right edge of CS scar significantly improved today. No Cyanosis, Clubbing , trace pedal edema, No new Rash or bruise   Data Review   CBC w Diff: Lab Results  Component Value Date   WBC 10.5 01/13/2016   HGB 11.5* 01/13/2016   HCT 35.5* 01/13/2016   PLT 482* 01/13/2016   LYMPHOPCT 17 01/08/2016   MONOPCT 6 01/08/2016   EOSPCT 0 01/08/2016   BASOPCT 0 01/08/2016    CMP: Lab Results  Component Value Date   NA 139 01/13/2016   K 3.8 01/13/2016   CL 107 01/13/2016   CO2 23 01/13/2016   BUN 14 01/13/2016   CREATININE 0.45 01/13/2016   PROT 5.9* 01/12/2016   ALBUMIN 2.1* 01/12/2016   BILITOT 0.9 01/12/2016   ALKPHOS 179* 01/12/2016   AST 82* 01/12/2016   ALT 46 01/12/2016  .   Total Time in preparing paper work, data evaluation and todays exam - 35 minutes  ELGERGAWY, DAWOOD M.D on 01/13/2016 at 10:58 AM  [facility]   Office  [phone]

## 2016-01-15 ENCOUNTER — Telehealth: Payer: Self-pay | Admitting: Family Medicine

## 2016-01-15 NOTE — Telephone Encounter (Signed)
Refill on Pain Meds

## 2016-01-16 NOTE — Telephone Encounter (Signed)
Pt called the front desk and informed me that she had went to the ED for a hematoma behind her incision and was prescribed Oxycodone 5 mg.  Pt stated that she is continuing to have severe pain and is requesting a refill on the Oxycodone medication.  I advised pt that she would need to come in for evaluation from one our providers because our providers did not prescribe the original prescrition.  Pt stated that she was fine and made an appt for Friday April 21st @ 0800.  Pt agreed to the appt time with no further questions.

## 2016-01-17 ENCOUNTER — Ambulatory Visit: Payer: Medicaid Other | Admitting: Family Medicine

## 2016-01-18 ENCOUNTER — Ambulatory Visit: Payer: Medicaid Other | Admitting: Family Medicine

## 2016-02-12 ENCOUNTER — Emergency Department (HOSPITAL_COMMUNITY)
Admission: EM | Admit: 2016-02-12 | Discharge: 2016-02-12 | Disposition: A | Discharge status: Home / Self Care | Payer: Medicaid Other | Attending: Emergency Medicine | Admitting: Emergency Medicine

## 2016-02-12 ENCOUNTER — Encounter (HOSPITAL_COMMUNITY): Payer: Self-pay

## 2016-02-12 ENCOUNTER — Emergency Department (HOSPITAL_COMMUNITY): Payer: Medicaid Other

## 2016-02-12 DIAGNOSIS — N939 Abnormal uterine and vaginal bleeding, unspecified: Secondary | ICD-10-CM

## 2016-02-12 DIAGNOSIS — Z79891 Long term (current) use of opiate analgesic: Secondary | ICD-10-CM | POA: Diagnosis not present

## 2016-02-12 DIAGNOSIS — R102 Pelvic and perineal pain: Secondary | ICD-10-CM | POA: Insufficient documentation

## 2016-02-12 DIAGNOSIS — F329 Major depressive disorder, single episode, unspecified: Secondary | ICD-10-CM | POA: Insufficient documentation

## 2016-02-12 DIAGNOSIS — Z791 Long term (current) use of non-steroidal anti-inflammatories (NSAID): Secondary | ICD-10-CM | POA: Insufficient documentation

## 2016-02-12 DIAGNOSIS — R109 Unspecified abdominal pain: Secondary | ICD-10-CM | POA: Diagnosis present

## 2016-02-12 DIAGNOSIS — F17210 Nicotine dependence, cigarettes, uncomplicated: Secondary | ICD-10-CM | POA: Diagnosis not present

## 2016-02-12 DIAGNOSIS — K219 Gastro-esophageal reflux disease without esophagitis: Secondary | ICD-10-CM | POA: Insufficient documentation

## 2016-02-12 DIAGNOSIS — Z79899 Other long term (current) drug therapy: Secondary | ICD-10-CM | POA: Insufficient documentation

## 2016-02-12 LAB — HEPATIC FUNCTION PANEL
ALT: 33 U/L (ref 14–54)
AST: 38 U/L (ref 15–41)
Albumin: 3.6 g/dL (ref 3.5–5.0)
Alkaline Phosphatase: 137 U/L — ABNORMAL HIGH (ref 38–126)
BILIRUBIN DIRECT: 0.2 mg/dL (ref 0.1–0.5)
Indirect Bilirubin: 0.4 mg/dL (ref 0.3–0.9)
Total Bilirubin: 0.6 mg/dL (ref 0.3–1.2)
Total Protein: 6.8 g/dL (ref 6.5–8.1)

## 2016-02-12 LAB — BASIC METABOLIC PANEL
ANION GAP: 6 (ref 5–15)
BUN: <5 mg/dL — ABNORMAL LOW (ref 6–20)
CALCIUM: 9.3 mg/dL (ref 8.9–10.3)
CO2: 24 mmol/L (ref 22–32)
Chloride: 110 mmol/L (ref 101–111)
Creatinine, Ser: 0.45 mg/dL (ref 0.44–1.00)
GLUCOSE: 89 mg/dL (ref 65–99)
Potassium: 3.3 mmol/L — ABNORMAL LOW (ref 3.5–5.1)
SODIUM: 140 mmol/L (ref 135–145)

## 2016-02-12 LAB — CBC WITH DIFFERENTIAL/PLATELET
BASOS ABS: 0 10*3/uL (ref 0.0–0.1)
BASOS PCT: 0 %
EOS ABS: 0 10*3/uL (ref 0.0–0.7)
EOS PCT: 1 %
HCT: 35.8 % — ABNORMAL LOW (ref 36.0–46.0)
Hemoglobin: 11.6 g/dL — ABNORMAL LOW (ref 12.0–15.0)
Lymphocytes Relative: 26 %
Lymphs Abs: 2.1 10*3/uL (ref 0.7–4.0)
MCH: 27.3 pg (ref 26.0–34.0)
MCHC: 32.4 g/dL (ref 30.0–36.0)
MCV: 84.2 fL (ref 78.0–100.0)
MONO ABS: 0.5 10*3/uL (ref 0.1–1.0)
Monocytes Relative: 6 %
Neutro Abs: 5.5 10*3/uL (ref 1.7–7.7)
Neutrophils Relative %: 67 %
PLATELETS: 293 10*3/uL (ref 150–400)
RBC: 4.25 MIL/uL (ref 3.87–5.11)
RDW: 16.3 % — AB (ref 11.5–15.5)
WBC: 8.2 10*3/uL (ref 4.0–10.5)

## 2016-02-12 LAB — WET PREP, GENITAL
CLUE CELLS WET PREP: NONE SEEN
Sperm: NONE SEEN
TRICH WET PREP: NONE SEEN
WBC, Wet Prep HPF POC: NONE SEEN
Yeast Wet Prep HPF POC: NONE SEEN

## 2016-02-12 LAB — HCG, QUANTITATIVE, PREGNANCY: HCG, BETA CHAIN, QUANT, S: 1 m[IU]/mL (ref <5)

## 2016-02-12 MED ORDER — OXYCODONE-ACETAMINOPHEN 5-325 MG PO TABS: 1.0000 | ORAL_TABLET | Freq: Four times a day (QID) | ORAL | Status: DC | PRN

## 2016-02-12 MED ORDER — HYDROMORPHONE HCL 1 MG/ML IJ SOLN
1.0000 mg | Freq: Once | INTRAMUSCULAR | Status: AC
Start: 2016-02-12 — End: 2016-02-12
  Administered 2016-02-12: 1 mg via INTRAVENOUS
  Filled 2016-02-12: qty 1

## 2016-02-12 MED ORDER — FERROUS SULFATE 325 (65 FE) MG PO TABS: 325.0000 mg | ORAL_TABLET | Freq: Two times a day (BID) | ORAL | Status: DC

## 2016-02-12 MED ORDER — ONDANSETRON HCL 4 MG/2ML IJ SOLN
4.0000 mg | Freq: Once | INTRAMUSCULAR | Status: AC
  Administered 2016-02-12: 4 mg via INTRAVENOUS
  Filled 2016-02-12: qty 2

## 2016-02-12 MED ORDER — OXYCODONE-ACETAMINOPHEN 5-325 MG PO TABS
1.0000 | ORAL_TABLET | Freq: Once | ORAL | Status: AC
  Administered 2016-02-12: 1 via ORAL
  Filled 2016-02-12: qty 1

## 2016-02-12 MED ORDER — SODIUM CHLORIDE 0.9 % IV BOLUS (SEPSIS)
1000.0000 mL | Freq: Once | INTRAVENOUS | Status: AC
  Administered 2016-02-12: 1000 mL via INTRAVENOUS

## 2016-02-12 MED ORDER — IBUPROFEN 800 MG PO TABS: 800.0000 mg | ORAL_TABLET | Freq: Three times a day (TID) | ORAL | Status: DC

## 2016-02-12 NOTE — ED Provider Notes (Signed)
CSN: [id number]     Arrival date & time 02/12/16  1404 History   First MD Initiated Contact with Patient 02/12/16 1458     Chief Complaint  Patient presents with  . Abdominal Pain     (Consider location/radiation/quality/duration/timing/severity/associated sxs/prior Treatment) HPI Comments: Patient presents with complaint of pelvic pain and vaginal bleeding. Patient was hospitalized in early April 2017 and had cesarean section with twins. This was complicated by blood loss and patient required 5 units of blood postop. She was admitted approximately one month ago with anemia and concern for sepsis related to her surgical site. She was treated in the ICU with antibiotics and fluids. Patient states that she has continued to have pain in this area. She has not followed up with OB/GYN. Over the past 5 days the pain has worsened and she has had heavy bleeding going through a pad approximately every 2 hours. She states that she feels very weak and tired. She gets lightheadedness with standing. She has shortness of breath. CT at previous ED visit showed hematoma related to C-section. She also had a CT a of her chest performed at that time which was negative. She denies fever. Patient states that she has had several episodes of vomiting since last night. No diarrhea. No urinary symptoms. Onset of symptoms acute on chronic. Course is gradually worsening. Nothing makes symptoms better or worse.  Patient is a 25 y.o. female presenting with abdominal pain. The history is provided by the patient.  Abdominal Pain Associated symptoms: nausea, shortness of breath, vaginal bleeding, vaginal discharge and vomiting   Associated symptoms: no chest pain, no cough, no diarrhea, no dysuria, no fever and no sore throat     Past Medical History  Diagnosis Date  . Depression   . GERD (gastroesophageal reflux disease)   . UTI (urinary tract infection) in pregnancy in first trimester   . Herpes   . Infection     UTI    Past Surgical History  Procedure Laterality Date  . Dilation and curettage of uterus    . Cesarean section N/A 12/31/2015    Procedure: CESAREAN SECTION;  Surgeon: Catalina Antigua, MD;  Location: [facility];  Service: Obstetrics;  Laterality: N/A;   Family History  Problem Relation Age of Onset  . Hypertension Maternal Grandmother   . Depression Maternal Grandmother   . Heart disease Maternal Grandmother   . Cancer Maternal Grandfather     lung   Social History  Substance Use Topics  . Smoking status: Current Every Day Smoker -- 0.25 packs/day for 3 years    Types: Cigarettes    Start date: 04/29/2007  . Smokeless tobacco: Never Used     Comment: Declines help  . Alcohol Use: No   OB History    Gravida Para Term Preterm AB TAB SAB Ectopic Multiple Living   Review of Systems  Constitutional: Negative for fever.  HENT: Negative for rhinorrhea and sore throat.   Eyes: Negative for redness.  Respiratory: Positive for shortness of breath. Negative for cough.   Cardiovascular: Negative for chest pain.  Gastrointestinal: Positive for nausea and vomiting. Negative for abdominal pain and diarrhea.  Genitourinary: Positive for vaginal bleeding, vaginal discharge and pelvic pain. Negative for dysuria.  Musculoskeletal: Negative for myalgias.  Skin: Negative for rash.  Neurological: Positive for weakness and light-headedness. Negative for syncope and headaches.    Allergies  Codeine  Home Medications  Prior to Admission medications   Medication Sig Start Date End Date Taking? Authorizing Provider  hydrALAZINE (APRESOLINE) 50 MG tablet Take 1 tablet (50 mg total) by mouth every 8 (eight) hours. Patient not taking: Reported on 02/12/2016 01/13/16   Leana Roe Elgergawy, MD  ibuprofen (ADVIL,MOTRIN) 600 MG tablet Take 1 tablet (600 mg total) by mouth every 6 (six) hours. Patient not taking: Reported on 02/12/2016 01/03/16   Campbell Stall, MD  oxyCODONE (OXY  IR/ROXICODONE) 5 MG immediate release tablet Take 1 tablet (5 mg total) by mouth every 6 (six) hours as needed for severe pain. Patient not taking: Reported on 02/12/2016 01/13/16   Leana Roe Elgergawy, MD   BP 122/78 mmHg  Pulse 85  Temp(Src) 97.8 F (36.6 C) (Oral)  Resp 14  SpO2 100%  LMP 02/12/2016 (Approximate)   Physical Exam  Constitutional: She appears well-developed and well-nourished.  HENT:  Head: Normocephalic and atraumatic.  Mouth/Throat: Oropharynx is clear and moist.  Pale appears pale but conjunctiva appear normal.   Eyes: Conjunctivae are normal. Right eye exhibits no discharge. Left eye exhibits no discharge.  Neck: Normal range of motion. Neck supple.  Cardiovascular: Normal rate, regular rhythm and normal heart sounds.   No murmur heard. Pulmonary/Chest: Effort normal and breath sounds normal. No respiratory distress. She has no wheezes. She has no rales.  Abdominal: Soft. Bowel sounds are normal. There is tenderness (bilateral lower abdomen). There is no rebound and no guarding.  Genitourinary: There is no rash or tenderness on the right labia. There is no rash or tenderness on the left labia. There is bleeding (approx 1 cup of blood noted in vaginal vault) in the vagina. No tenderness in the vagina. No vaginal discharge found.  Neurological: She is alert.  Skin: Skin is warm and dry.  Psychiatric: She has a normal mood and affect.  Nursing note and vitals reviewed.   ED Course  Procedures (including critical care time) Labs Review Labs Reviewed  CBC WITH DIFFERENTIAL/PLATELET - Abnormal; Notable for the following:    Hemoglobin 11.6 (*)    HCT 35.8 (*)    RDW 16.3 (*)    All other components within normal limits  BASIC METABOLIC PANEL - Abnormal; Notable for the following:    Potassium 3.3 (*)    BUN <5 (*)    All other components within normal limits  WET PREP, GENITAL  HCG, QUANTITATIVE, PREGNANCY  HEPATIC FUNCTION PANEL  GC/CHLAMYDIA PROBE AMP  ([place]) NOT AT [facility]    Imaging Review US Transvaginal Non-ob  02/12/2016  CLINICAL DATA:  Heavy vaginal bleeding after C-section on December 31, 2015 EXAM: TRANSABDOMINAL AND TRANSVAGINAL ULTRASOUND OF PELVIS TECHNIQUE: Both transabdominal and transvaginal ultrasound examinations of the pelvis were performed. Transabdominal technique was performed for global imaging of the pelvis including uterus, ovaries, adnexal regions, and pelvic cul-de-sac. It was necessary to proceed with endovaginal exam following the transabdominal exam to visualize the ovaries and uterus. COMPARISON:  December 30, 2015 FINDINGS: Uterus Measurements: 12.1 x 4.6 x 6.8 cm. There is a 7.4 x 7 x 7.7 cm hypoechoic uterine fibroid in the anterior uterine myometrium. Endometrium Thickness:  12.4 mm.  No focal abnormality visualized. Right ovary Measurements: 3 x 1.5 x 2.5 cm. Normal appearance/no adnexal mass. Left ovary Measurements: 3.7 x 1.8 x 2.4 cm. Normal appearance/no adnexal mass. Other findings No abnormal free fluid. IMPRESSION: Uterine fibroid.  Otherwise normal. Electronically Signed   By: Sherian Rein M.D.   On: 02/12/2016 17:28  [place] Pelvis Complete  02/12/2016  CLINICAL DATA:  Heavy vaginal bleeding after C-section on December 31, 2015 EXAM: TRANSABDOMINAL AND TRANSVAGINAL ULTRASOUND OF PELVIS TECHNIQUE: Both transabdominal and transvaginal ultrasound examinations of the pelvis were performed. Transabdominal technique was performed for global imaging of the pelvis including uterus, ovaries, adnexal regions, and pelvic cul-de-sac. It was necessary to proceed with endovaginal exam following the transabdominal exam to visualize the ovaries and uterus. COMPARISON:  December 30, 2015 FINDINGS: Uterus Measurements: 12.1 x 4.6 x 6.8 cm. There is a 7.4 x 7 x 7.7 cm hypoechoic uterine fibroid in the anterior uterine myometrium. Endometrium Thickness:  12.4 mm.  No focal abnormality visualized. Right ovary Measurements: 3 x 1.5 x 2.5 cm. Normal  appearance/no adnexal mass. Left ovary Measurements: 3.7 x 1.8 x 2.4 cm. Normal appearance/no adnexal mass. Other findings No abnormal free fluid. IMPRESSION: Uterine fibroid.  Otherwise normal. Electronically Signed   By: Sherian ReinWei-Chen  Lin M.D.   On: 02/12/2016 17:28   I have personally reviewed and evaluated these images and lab results as part of my medical decision-making.  3:31 PM Patient seen and examined. Hemoglobin stable. Vital signs within normal limits. Will perform pelvic exam. Likely will need pelvic ultrasound performed. Pain control prior to exam.  Vital signs reviewed and are as follows: BP 122/78 mmHg  Pulse 85  Temp(Src) 97.8 F (36.6 C) (Oral)  Resp 14  SpO2 100%  LMP 02/12/2016 (Approximate)  Pelvic exam performed with RN chaperone Pearson Grippe(Kenzie).   Imaging and labs discussed with Dr. Rubin PayorPickering. I discussed the case with Dr. Debroah LoopArnold of OB/GYN who reviewed patient case by telephone. He is also reassured by normal vitals, hemoglobin tonight. He would not start on hormonal therapy at this time. He encourages NSAIDs and iron supplementation. Patient has a follow-up appointment already scheduled for 5/24. Encourage patient to keep this.  Patient informed of results and discussion with OB/GYN. Mother at bedside. Patient is anxious to get home to take care of her children. Will discharge to home with ibuprofen, Percocet, iron supplements. Prescription written. Patient informed of her follow-up appointment on 5/24. We have long discussion about keeping this appointment for recheck. She seems willing to do so.  Encourage patient to return with worsening or changing symptoms including fever, worsening abdominal pain, worsening bleeding, dizziness, syncope, difficulty breathing or other concerns. She verbalizes understanding and agrees with plan.  MDM   Final diagnoses:  Pelvic pain in female   Patient with vaginal bleeding after cesarean section 1.5 months ago. Patient had bleeding  complications after this procedure. She is not orthostatic by numbers tonight. Her hemoglobin is reassuring at 11.6. She does have some active bleeding on exam. She seems stable. Ultrasound shows what appears to be a fibroid. I can also not rule out hematoma given previous CT findings. However, patient is stable and comfortable with discharge. I did discuss case with OB/GYN. They do not have any other recommendations other than NSAIDs and iron supplementation. Patient has follow-up appointment in 8 days. We discussed return and follow-up precautions as discussed above. Patient seems reliable to return with worsening symptoms. No indication for admission or blood transfusion at this time. No concern for infection related to her previous surgery.   Renne CriglerJoshua Athol Bolds, PA-C 02/12/16 2034  Benjiman CoreNathan Pickering, MD 02/13/16 1105

## 2016-02-12 NOTE — ED Notes (Addendum)
Pt presents with c/o lower abdominal pain and vaginal bleeding for the past 5 days. Pt reports she has been through approx 7 sanitary pads today. Pt had a c-section in April with some bleeding complications during the surgery. 20g right forearm placed by EMS.

## 2016-02-12 NOTE — Discharge Instructions (Signed)
Please read and follow all provided instructions.  Your diagnoses today include:  1. Vaginal bleeding   2. Pelvic pain in female     Tests performed today include:  Blood counts and electrolytes  Ultrasound - shows fibroid, no other severe problems  Vital signs. See below for your results today.   Medications prescribed:   Percocet (oxycodone/acetaminophen) - narcotic pain medication  DO NOT drive or perform any activities that require you to be awake and alert because this medicine can make you drowsy. BE VERY CAREFUL not to take multiple medicines containing Tylenol (also called acetaminophen). Doing so can lead to an overdose which can damage your liver and cause liver failure and possibly death.   Ibuprofen (Motrin, Advil) - anti-inflammatory pain medication  Do not exceed 600mg  ibuprofen every 6 hours, take with food  You have been prescribed an anti-inflammatory medication or NSAID. Take with food. Take smallest effective dose for the shortest duration needed for your pain. Stop taking if you experience stomach pain or vomiting.   Take any prescribed medications only as directed.  Home care instructions:   Follow any educational materials contained in this packet.  Follow-up instructions: Please follow-up with your GYN on 5/24 as planned for further evaluation of your symptoms.    Return instructions:  SEEK IMMEDIATE MEDICAL ATTENTION IF:  The pain does not go away or becomes severe   A temperature above 101F develops   Repeated vomiting occurs (multiple episodes)   The pain becomes localized to portions of the abdomen. The right side could possibly be appendicitis. In an adult, the left lower portion of the abdomen could be colitis or diverticulitis.   Blood is being passed in stools or vomit (bright red or black tarry stools)   You develop chest pain, difficulty breathing, dizziness or fainting, or become confused, poorly responsive, or inconsolable (young  children)  If you have any other emergent concerns regarding your health  Additional Information: Abdominal (belly) pain can be caused by many things. Your caregiver performed an examination and possibly ordered blood/urine tests and imaging (CT scan, x-rays, ultrasound). Many cases can be observed and treated at home after initial evaluation in the emergency department. Even though you are being discharged home, abdominal pain can be unpredictable. Therefore, you need a repeated exam if your pain does not resolve, returns, or worsens. Most patients with abdominal pain don't have to be admitted to the hospital or have surgery, but serious problems like appendicitis and gallbladder attacks can start out as nonspecific pain. Many abdominal conditions cannot be diagnosed in one visit, so follow-up evaluations are very important.  Your vital signs today were: BP 113/71 mmHg   Pulse 95   Temp(Src) 97.8 F (36.6 C) (Oral)   Resp 20   SpO2 100%   LMP 02/12/2016 (Approximate) If your blood pressure (bp) was elevated above 135/85 this visit, please have this repeated by your doctor within one month. --------------

## 2016-02-12 NOTE — ED Notes (Signed)
Patient transported to Ultrasound 

## 2016-02-13 LAB — GC/CHLAMYDIA PROBE AMP (~~LOC~~) NOT AT ARMC
CHLAMYDIA, DNA PROBE: NEGATIVE
NEISSERIA GONORRHEA: NEGATIVE

## 2016-02-20 ENCOUNTER — Ambulatory Visit: Payer: Medicaid Other | Admitting: Obstetrics & Gynecology

## 2016-03-17 ENCOUNTER — Ambulatory Visit: Payer: Medicaid Other | Admitting: Medical

## 2016-04-07 ENCOUNTER — Emergency Department (HOSPITAL_COMMUNITY)
Admission: EM | Admit: 2016-04-07 | Discharge: 2016-04-07 | Disposition: A | Discharge status: Home / Self Care | Payer: Medicaid Other | Attending: Emergency Medicine | Admitting: Emergency Medicine

## 2016-04-07 ENCOUNTER — Encounter (HOSPITAL_COMMUNITY): Payer: Self-pay | Admitting: Emergency Medicine

## 2016-04-07 ENCOUNTER — Emergency Department (HOSPITAL_COMMUNITY): Payer: Medicaid Other

## 2016-04-07 DIAGNOSIS — Y999 Unspecified external cause status: Secondary | ICD-10-CM | POA: Insufficient documentation

## 2016-04-07 DIAGNOSIS — T148XXA Other injury of unspecified body region, initial encounter: Secondary | ICD-10-CM

## 2016-04-07 DIAGNOSIS — N390 Urinary tract infection, site not specified: Secondary | ICD-10-CM | POA: Insufficient documentation

## 2016-04-07 DIAGNOSIS — R102 Pelvic and perineal pain: Secondary | ICD-10-CM

## 2016-04-07 DIAGNOSIS — N939 Abnormal uterine and vaginal bleeding, unspecified: Secondary | ICD-10-CM

## 2016-04-07 DIAGNOSIS — Y939 Activity, unspecified: Secondary | ICD-10-CM | POA: Insufficient documentation

## 2016-04-07 DIAGNOSIS — S301XXA Contusion of abdominal wall, initial encounter: Secondary | ICD-10-CM | POA: Diagnosis not present

## 2016-04-07 DIAGNOSIS — X58XXXA Exposure to other specified factors, initial encounter: Secondary | ICD-10-CM | POA: Diagnosis not present

## 2016-04-07 DIAGNOSIS — Y929 Unspecified place or not applicable: Secondary | ICD-10-CM | POA: Diagnosis not present

## 2016-04-07 DIAGNOSIS — F17210 Nicotine dependence, cigarettes, uncomplicated: Secondary | ICD-10-CM | POA: Insufficient documentation

## 2016-04-07 DIAGNOSIS — F329 Major depressive disorder, single episode, unspecified: Secondary | ICD-10-CM | POA: Insufficient documentation

## 2016-04-07 LAB — URINALYSIS, ROUTINE W REFLEX MICROSCOPIC
GLUCOSE, UA: NEGATIVE mg/dL
Ketones, ur: 15 mg/dL — AB
Nitrite: POSITIVE — AB
PROTEIN: 100 mg/dL — AB
Specific Gravity, Urine: 1.011 (ref 1.005–1.030)
pH: 8 (ref 5.0–8.0)

## 2016-04-07 LAB — URINE MICROSCOPIC-ADD ON

## 2016-04-07 LAB — COMPREHENSIVE METABOLIC PANEL
ALBUMIN: 4.1 g/dL (ref 3.5–5.0)
ALK PHOS: 84 U/L (ref 38–126)
ALT: 24 U/L (ref 14–54)
AST: 38 U/L (ref 15–41)
Anion gap: 6 (ref 5–15)
BUN: 6 mg/dL (ref 6–20)
CALCIUM: 9.2 mg/dL (ref 8.9–10.3)
CO2: 25 mmol/L (ref 22–32)
CREATININE: 0.5 mg/dL (ref 0.44–1.00)
Chloride: 105 mmol/L (ref 101–111)
GFR calc Af Amer: >60 mL/min (ref >60)
GFR calc non Af Amer: >60 mL/min (ref >60)
GLUCOSE: 82 mg/dL (ref 65–99)
Potassium: 4.1 mmol/L (ref 3.5–5.1)
SODIUM: 136 mmol/L (ref 135–145)
Total Bilirubin: 0.9 mg/dL (ref 0.3–1.2)
Total Protein: 7 g/dL (ref 6.5–8.1)

## 2016-04-07 LAB — CBC
HCT: 38.1 % (ref 36.0–46.0)
Hemoglobin: 12.5 g/dL (ref 12.0–15.0)
MCH: 28.4 pg (ref 26.0–34.0)
MCHC: 32.8 g/dL (ref 30.0–36.0)
MCV: 86.6 fL (ref 78.0–100.0)
PLATELETS: 272 10*3/uL (ref 150–400)
RBC: 4.4 MIL/uL (ref 3.87–5.11)
RDW: 15.2 % (ref 11.5–15.5)
WBC: 6 10*3/uL (ref 4.0–10.5)

## 2016-04-07 LAB — POC URINE PREG, ED: PREG TEST UR: NEGATIVE

## 2016-04-07 LAB — LIPASE, BLOOD: Lipase: 21 U/L (ref 11–51)

## 2016-04-07 MED ORDER — CIPROFLOXACIN HCL 500 MG PO TABS: 500.0000 mg | ORAL_TABLET | Freq: Two times a day (BID) | ORAL | Status: DC

## 2016-04-07 MED ORDER — HYDROMORPHONE HCL 1 MG/ML IJ SOLN
0.5000 mg | Freq: Once | INTRAMUSCULAR | Status: AC
  Administered 2016-04-07: 0.5 mg via INTRAVENOUS
  Filled 2016-04-07: qty 1

## 2016-04-07 MED ORDER — OXYCODONE-ACETAMINOPHEN 5-325 MG PO TABS: 1.0000 | ORAL_TABLET | Freq: Four times a day (QID) | ORAL | Status: DC | PRN

## 2016-04-07 MED ORDER — IBUPROFEN 800 MG PO TABS: 800.0000 mg | ORAL_TABLET | Freq: Three times a day (TID) | ORAL | Status: DC

## 2016-04-07 MED ORDER — KETOROLAC TROMETHAMINE 30 MG/ML IJ SOLN
30.0000 mg | Freq: Once | INTRAMUSCULAR | Status: AC
  Administered 2016-04-07: 30 mg via INTRAVENOUS
  Filled 2016-04-07 (×2): qty 1

## 2016-04-07 MED ORDER — SODIUM CHLORIDE 0.9 % IV BOLUS (SEPSIS)
1000.0000 mL | Freq: Once | INTRAVENOUS | Status: AC
  Administered 2016-04-07: 1000 mL via INTRAVENOUS

## 2016-04-07 MED ORDER — CIPROFLOXACIN IN D5W 400 MG/200ML IV SOLN
400.0000 mg | Freq: Once | INTRAVENOUS | Status: AC
  Administered 2016-04-07: 400 mg via INTRAVENOUS
  Filled 2016-04-07: qty 200

## 2016-04-07 NOTE — Discharge Instructions (Signed)
You were seen and evaluated today for your continued abdominal/pelvic pain in your bleeding. The bleeding appears to be an normal amount. Likely your continued pain is secondary to the resolving hematoma. Please follow-up outpatient with a primary care physician as well as with an OB/GYN. I have provided information for one of the private practices I'm not sure which insurances they will except. You can also facilitate OB/GYN follow-up through your primary care doctor.  Hematoma A hematoma is a collection of blood under the skin, in an organ, in a body space, in a joint space, or in other tissue. The blood can clot to form a lump that you can see and feel. The lump is often firm and may sometimes become sore and tender. Most hematomas get better in a few days to weeks. However, some hematomas may be serious and require medical care. Hematomas can range in size from very small to very large. CAUSES  A hematoma can be caused by a blunt or penetrating injury. It can also be caused by spontaneous leakage from a blood vessel under the skin. Spontaneous leakage from a blood vessel is more likely to occur in older people, especially those taking blood thinners. Sometimes, a hematoma can develop after certain medical procedures. SIGNS AND SYMPTOMS   A firm lump on the body.  Possible pain and tenderness in the area.  Bruising.Blue, dark blue, purple-red, or yellowish skin may appear at the site of the hematoma if the hematoma is close to the surface of the skin. For hematomas in deeper tissues or body spaces, the signs and symptoms may be subtle. For example, an intra-abdominal hematoma may cause abdominal pain, weakness, fainting, and shortness of breath. An intracranial hematoma may cause a headache or symptoms such as weakness, trouble speaking, or a change in consciousness. DIAGNOSIS  A hematoma can usually be diagnosed based on your medical history and a physical exam. Imaging tests may be needed if  your health care provider suspects a hematoma in deeper tissues or body spaces, such as the abdomen, head, or chest. These tests may include ultrasonography or a CT scan.  TREATMENT  Hematomas usually go away on their own over time. Rarely does the blood need to be drained out of the body. Large hematomas or those that may affect vital organs will sometimes need surgical drainage or monitoring. HOME CARE INSTRUCTIONS   Apply ice to the injured area:   Put ice in a plastic bag.   Place a towel between your skin and the bag.   Leave the ice on for 20 minutes, 2-3 times a day for the first 1 to 2 days.   After the first 2 days, switch to using warm compresses on the hematoma.   Elevate the injured area to help decrease pain and swelling. Wrapping the area with an elastic bandage may also be helpful. Compression helps to reduce swelling and promotes shrinking of the hematoma. Make sure the bandage is not wrapped too tight.   If your hematoma is on a lower extremity and is painful, crutches may be helpful for a couple days.   Only take over-the-counter or prescription medicines as directed by your health care provider. SEEK IMMEDIATE MEDICAL CARE IF:   You have increasing pain, or your pain is not controlled with medicine.   You have a fever.   You have worsening swelling or discoloration.   Your skin over the hematoma breaks or starts bleeding.   Your hematoma is in your chest or  abdomen and you have weakness, shortness of breath, or a change in consciousness.  Your hematoma is on your scalp (caused by a fall or injury) and you have a worsening headache or a change in alertness or consciousness. MAKE SURE YOU:   Understand these instructions.  Will watch your condition.  Will get help right away if you are not doing well or get worse.   This information is not intended to replace advice given to you by your health care provider. Make sure you discuss any questions you  have with your health care provider.   Document Released: 04/29/2004 Document Revised: 05/18/2013 Document Reviewed: 02/23/2013 Elsevier Interactive Patient Education 2016 [employer]  Urinary Tract Infection Urinary tract infections (UTIs) can develop anywhere along your urinary tract. Your urinary tract is your body's drainage system for removing wastes and extra water. Your urinary tract includes two kidneys, two ureters, a bladder, and a urethra. Your kidneys are a pair of bean-shaped organs. Each kidney is about the size of your fist. They are located below your ribs, one on each side of your spine. CAUSES Infections are caused by microbes, which are microscopic organisms, including fungi, viruses, and bacteria. These organisms are so small that they can only be seen through a microscope. Bacteria are the microbes that most commonly cause UTIs. SYMPTOMS  Symptoms of UTIs may vary by age and gender of the patient and by the location of the infection. Symptoms in young women typically include a frequent and intense urge to urinate and a painful, burning feeling in the bladder or urethra during urination. Older women and men are more likely to be tired, shaky, and weak and have muscle aches and abdominal pain. A fever may mean the infection is in your kidneys. Other symptoms of a kidney infection include pain in your back or sides below the ribs, nausea, and vomiting. DIAGNOSIS To diagnose a UTI, your caregiver will ask you about your symptoms. Your caregiver will also ask you to provide a urine sample. The urine sample will be tested for bacteria and white blood cells. White blood cells are made by your body to help fight infection. TREATMENT  Typically, UTIs can be treated with medication. Because most UTIs are caused by a bacterial infection, they usually can be treated with the use of antibiotics. The choice of antibiotic and length of treatment depend on your symptoms and the type of bacteria  causing your infection. HOME CARE INSTRUCTIONS  If you were prescribed antibiotics, take them exactly as your caregiver instructs you. Finish the medication even if you feel better after you have only taken some of the medication.  Drink enough water and fluids to keep your urine clear or pale yellow.  Avoid caffeine, tea, and carbonated beverages. They tend to irritate your bladder.  Empty your bladder often. Avoid holding urine for long periods of time.  Empty your bladder before and after sexual intercourse.  After a bowel movement, women should cleanse from front to back. Use each tissue only once. SEEK MEDICAL CARE IF:   You have back pain.  You develop a fever.  Your symptoms do not begin to resolve within 3 days. SEEK IMMEDIATE MEDICAL CARE IF:   You have severe back pain or lower abdominal pain.  You develop chills.  You have nausea or vomiting.  You have continued burning or discomfort with urination. MAKE SURE YOU:   Understand these instructions.  Will watch your condition.  Will get help right away if  you are not doing well or get worse.   This information is not intended to replace advice given to you by your health care provider. Make sure you discuss any questions you have with your health care provider.   Document Released: 06/25/2005 Document Revised: 06/06/2015 Document Reviewed: 10/24/2011 Elsevier Interactive Patient Education [employer]

## 2016-04-07 NOTE — Progress Notes (Signed)
Confirms pcp [facility] EPIC updated

## 2016-04-07 NOTE — ED Notes (Signed)
Pt c/o vaginal bleeding and lower abdominal pain x 4 days. Pt reports having to change her tampon ever 5 minutes since 0300. Pt also reports N/V.

## 2016-04-07 NOTE — ED Provider Notes (Signed)
CSN: [id number]     Arrival date & time 04/07/16  1101 History   First MD Initiated Contact with Patient 04/07/16 1142     Chief Complaint  Patient presents with  . Vaginal Bleeding  . Abdominal Pain     (Consider location/radiation/quality/duration/timing/severity/associated sxs/prior Treatment) HPI Comments: 25 y.o. Female who is 3 months post partum after a c section for a twin delivery presents for persistent pain in her pelvis along her c section incision and for vaginal bleeding.  The patient states that she had a short period last month on the 20th and that over the last few days it has appeared she has another period but that today it was very heavy and she reports changing her tampon every five minutes for hours.  She reports that her pain at her incision has been getting worse.  She has not followed up outpatient for recheck by and OBGYN.  Denies vomiting, diarrhea.  Reports frequent, painful urination.    Patient is a 25 y.o. female presenting with vaginal bleeding and abdominal pain.  Vaginal Bleeding Associated symptoms: abdominal pain, dysuria and nausea   Associated symptoms: no back pain, no dizziness, no fatigue and no fever   Abdominal Pain Associated symptoms: dysuria, nausea and vaginal bleeding   Associated symptoms: no chest pain, no chills, no constipation, no cough, no diarrhea, no fatigue, no fever, no shortness of breath and no vomiting     Past Medical History  Diagnosis Date  . Depression   . GERD (gastroesophageal reflux disease)   . UTI (urinary tract infection) in pregnancy in first trimester   . Herpes   . Infection     UTI   Past Surgical History  Procedure Laterality Date  . Dilation and curettage of uterus    . Cesarean section N/A 12/31/2015    Procedure: CESAREAN SECTION;  Surgeon: Catalina AntiguaPeggy Constant, MD;  Location: [facility];  Service: Obstetrics;  Laterality: N/A;   Family History  Problem Relation Age of Onset  . Hypertension Maternal Grandmother    . Depression Maternal Grandmother   . Heart disease Maternal Grandmother   . Cancer Maternal Grandfather     lung   Social History  Substance Use Topics  . Smoking status: Current Every Day Smoker -- 0.25 packs/day for 3 years    Types: Cigarettes    Start date: 04/29/2007  . Smokeless tobacco: Never Used     Comment: Declines help  . Alcohol Use: No   OB History    Gravida Para Term Preterm AB TAB SAB Ectopic Multiple Living   4 2 1 1 2 1 1  1 2      Review of Systems  Constitutional: Negative for fever, chills, appetite change and fatigue.  HENT: Negative for congestion.   Eyes: Negative for visual disturbance.  Respiratory: Negative for cough, chest tightness and shortness of breath.   Cardiovascular: Negative for chest pain and palpitations.  Gastrointestinal: Positive for nausea and abdominal pain. Negative for vomiting, diarrhea and constipation.  Genitourinary: Positive for dysuria, frequency, vaginal bleeding and pelvic pain. Negative for flank pain and decreased urine volume.  Musculoskeletal: Negative for myalgias and back pain.  Skin: Positive for wound (was told she has a hematoma at her incision).  Neurological: Negative for dizziness, weakness and light-headedness.  Hematological: Does not bruise/bleed easily.      Allergies  Codeine  Home Medications   Prior to Admission medications   Medication Sig Start Date End Date Taking? Authorizing Provider  ciprofloxacin (CIPRO) 500 MG tablet Take 1 tablet (500 mg total) by mouth every 12 (twelve) hours. 04/07/16   Leta Baptist, MD  ferrous sulfate 325 (65 FE) MG tablet Take 1 tablet (325 mg total) by mouth 2 (two) times daily with a meal. Patient not taking: Reported on 04/07/2016 02/12/16   Renne Crigler, PA-C  ibuprofen (ADVIL,MOTRIN) 800 MG tablet Take 1 tablet (800 mg total) by mouth 3 (three) times daily. 04/07/16   Leta Baptist, MD  oxyCODONE-acetaminophen (PERCOCET/ROXICET) 5-325 MG tablet Take 1-2  tablets by mouth every 6 (six) hours as needed for severe pain. 04/07/16   Leta Baptist, MD   BP 108/68 mmHg  Pulse 81  Temp(Src) 98.4 F (36.9 C) (Oral)  Resp 18  SpO2 100%  LMP 03/18/2016 Physical Exam  Constitutional: She is oriented to person, place, and time. She appears well-developed and well-nourished. No distress.  HENT:  Head: Normocephalic and atraumatic.  Right Ear: External ear normal.  Left Ear: External ear normal.  Nose: Nose normal.  Mouth/Throat: Oropharynx is clear and moist. No oropharyngeal exudate.  Eyes: EOM are normal. Pupils are equal, round, and reactive to light.  Neck: Normal range of motion. Neck supple.  Cardiovascular: Normal rate, regular rhythm, normal heart sounds and intact distal pulses.   No murmur heard. Pulmonary/Chest: Effort normal. No respiratory distress. She has no wheezes. She has no rales.  Abdominal: Soft. She exhibits no distension. There is tenderness (over incision site mostly on the right side).    Genitourinary: There is bleeding (mild, no clots or pooling in vaginal ault) in the vagina. No vaginal discharge found.  Musculoskeletal: Normal range of motion. She exhibits no edema or tenderness.  Neurological: She is alert and oriented to person, place, and time.  Skin: Skin is warm and dry. No rash noted. She is not diaphoretic.  Vitals reviewed.   ED Course  Procedures (including critical care time) Labs Review Labs Reviewed  URINALYSIS, ROUTINE W REFLEX MICROSCOPIC (NOT AT [facility]) - Abnormal; Notable for the following:    Color, Urine RED (*)    APPearance TURBID (*)    Hgb urine dipstick LARGE (*)    Bilirubin Urine MODERATE (*)    Ketones, ur 15 (*)    Protein, ur 100 (*)    Nitrite POSITIVE (*)    Leukocytes, UA MODERATE (*)    All other components within normal limits  URINE MICROSCOPIC-ADD ON - Abnormal; Notable for the following:    Squamous Epithelial / LPF 0-5 (*)    Bacteria, UA RARE (*)    All other  components within normal limits  URINE CULTURE  LIPASE, BLOOD  COMPREHENSIVE METABOLIC PANEL  CBC  POC URINE PREG, ED    Imaging Review US Transvaginal Non-ob  04/07/2016  CLINICAL DATA:  Twin birth 3 months ago by C-section. Heavy bleeding for 4 days. EXAM: TRANSABDOMINAL AND TRANSVAGINAL ULTRASOUND OF PELVIS TECHNIQUE: Both transabdominal and transvaginal ultrasound examinations of the pelvis were performed. Transabdominal technique was performed for global imaging of the pelvis including uterus, ovaries, adnexal regions, and pelvic cul-de-sac. It was necessary to proceed with endovaginal exam following the transabdominal exam to visualize the uterus, endometrium, ovaries and adnexa . COMPARISON:  Ultrasound 02/12/2016.  CT 01/08/2016. FINDINGS: Uterus Measurements: 8.8 x 4.6 x 4.7 cm. No fibroids or other mass visualized. Anterior to the uterus is a 3.9 x 3.8 x 3.7 cm heterogeneous mixed echotexture masslike area. When compared to prior CT, this likely reflects residual  hematoma. Endometrium Thickness: 13 mm in thickness.  No focal abnormality visualized. Right ovary Measurements: 2.9 x 2.6 x 2.1 cm. Normal appearance/no adnexal mass. Left ovary Measurements: 2.3 x 1.7 x 1.2 cm. Normal appearance/no adnexal mass. Other findings Small amount of free fluid in the pelvis. IMPRESSION: Complex soft tissue area anterior to the uterus in the region of the C-section scar, likely residual hematoma. This measures up to 3.9 cm. On prior CT, this measured up to 10.7 cm. Electronically Signed   By: Charlett Nose M.D.   On: 04/07/2016 16:00   US Pelvis Complete  04/07/2016  CLINICAL DATA:  Twin birth 3 months ago by C-section. Heavy bleeding for 4 days. EXAM: TRANSABDOMINAL AND TRANSVAGINAL ULTRASOUND OF PELVIS TECHNIQUE: Both transabdominal and transvaginal ultrasound examinations of the pelvis were performed. Transabdominal technique was performed for global imaging of the pelvis including uterus, ovaries,  adnexal regions, and pelvic cul-de-sac. It was necessary to proceed with endovaginal exam following the transabdominal exam to visualize the uterus, endometrium, ovaries and adnexa . COMPARISON:  Ultrasound 02/12/2016.  CT 01/08/2016. FINDINGS: Uterus Measurements: 8.8 x 4.6 x 4.7 cm. No fibroids or other mass visualized. Anterior to the uterus is a 3.9 x 3.8 x 3.7 cm heterogeneous mixed echotexture masslike area. When compared to prior CT, this likely reflects residual hematoma. Endometrium Thickness: 13 mm in thickness.  No focal abnormality visualized. Right ovary Measurements: 2.9 x 2.6 x 2.1 cm. Normal appearance/no adnexal mass. Left ovary Measurements: 2.3 x 1.7 x 1.2 cm. Normal appearance/no adnexal mass. Other findings Small amount of free fluid in the pelvis. IMPRESSION: Complex soft tissue area anterior to the uterus in the region of the C-section scar, likely residual hematoma. This measures up to 3.9 cm. On prior CT, this measured up to 10.7 cm. Electronically Signed   By: Charlett Nose M.D.   On: 04/07/2016 16:00   I have personally reviewed and evaluated these images and lab results as part of my medical decision-making.   EKG Interpretation None      MDM  Patient was seen and evaluated in stable condition.  Patient with tenderness in pelvic region and along incision site.  [place] with healing hematoma.  On pelvic examination patient with normal about of vaginal bleeding.  Stable hemoglobin and stable vitals.  UA consistent with infection - reported intolerance to Keflex and so Cipro given and prescribed.  Discussed at length with patient and family the need for outpatient follow up and establishing primary care for the patient's health and for the best care long term possible.  She and family expressed understanding and agreement with plan for discharge and outpatient follow up. Final diagnoses:  Hematoma    1. UTI  2. Hematoma, healing    Leta Baptist, MD 04/07/16 2230

## 2016-04-08 LAB — URINE CULTURE

## 2016-04-19 ENCOUNTER — Encounter (HOSPITAL_COMMUNITY): Payer: Self-pay

## 2016-04-19 ENCOUNTER — Emergency Department (HOSPITAL_COMMUNITY)
Admission: EM | Admit: 2016-04-19 | Discharge: 2016-04-19 | Disposition: A | Discharge status: Home / Self Care | Payer: Medicaid Other | Attending: Emergency Medicine | Admitting: Emergency Medicine

## 2016-04-19 DIAGNOSIS — F329 Major depressive disorder, single episode, unspecified: Secondary | ICD-10-CM | POA: Diagnosis not present

## 2016-04-19 DIAGNOSIS — R300 Dysuria: Secondary | ICD-10-CM | POA: Insufficient documentation

## 2016-04-19 DIAGNOSIS — G8918 Other acute postprocedural pain: Secondary | ICD-10-CM | POA: Insufficient documentation

## 2016-04-19 DIAGNOSIS — R112 Nausea with vomiting, unspecified: Secondary | ICD-10-CM | POA: Diagnosis not present

## 2016-04-19 DIAGNOSIS — L7682 Other postprocedural complications of skin and subcutaneous tissue: Secondary | ICD-10-CM

## 2016-04-19 DIAGNOSIS — R1030 Lower abdominal pain, unspecified: Secondary | ICD-10-CM | POA: Diagnosis present

## 2016-04-19 DIAGNOSIS — F17210 Nicotine dependence, cigarettes, uncomplicated: Secondary | ICD-10-CM | POA: Insufficient documentation

## 2016-04-19 DIAGNOSIS — G8929 Other chronic pain: Secondary | ICD-10-CM

## 2016-04-19 DIAGNOSIS — R109 Unspecified abdominal pain: Secondary | ICD-10-CM

## 2016-04-19 LAB — URINALYSIS, ROUTINE W REFLEX MICROSCOPIC
BILIRUBIN URINE: NEGATIVE
GLUCOSE, UA: NEGATIVE mg/dL
HGB URINE DIPSTICK: NEGATIVE
Ketones, ur: NEGATIVE mg/dL
Leukocytes, UA: NEGATIVE
NITRITE: NEGATIVE
PH: 7.5 (ref 5.0–8.0)
Protein, ur: NEGATIVE mg/dL
SPECIFIC GRAVITY, URINE: 1.012 (ref 1.005–1.030)

## 2016-04-19 LAB — COMPREHENSIVE METABOLIC PANEL
ALBUMIN: 4.2 g/dL (ref 3.5–5.0)
ALT: 23 U/L (ref 14–54)
AST: 26 U/L (ref 15–41)
Alkaline Phosphatase: 80 U/L (ref 38–126)
Anion gap: 4 — ABNORMAL LOW (ref 5–15)
BUN: 9 mg/dL (ref 6–20)
CHLORIDE: 106 mmol/L (ref 101–111)
CO2: 27 mmol/L (ref 22–32)
CREATININE: 0.44 mg/dL (ref 0.44–1.00)
Calcium: 9.3 mg/dL (ref 8.9–10.3)
GFR calc Af Amer: >60 mL/min (ref >60)
GFR calc non Af Amer: >60 mL/min (ref >60)
GLUCOSE: 88 mg/dL (ref 65–99)
Potassium: 4 mmol/L (ref 3.5–5.1)
SODIUM: 137 mmol/L (ref 135–145)
Total Bilirubin: 0.3 mg/dL (ref 0.3–1.2)
Total Protein: 7.1 g/dL (ref 6.5–8.1)

## 2016-04-19 LAB — CBC WITH DIFFERENTIAL/PLATELET
BASOS ABS: 0 10*3/uL (ref 0.0–0.1)
Basophils Relative: 0 %
EOS ABS: 0.1 10*3/uL (ref 0.0–0.7)
EOS PCT: 1 %
HCT: 38.7 % (ref 36.0–46.0)
HEMOGLOBIN: 12.5 g/dL (ref 12.0–15.0)
Lymphocytes Relative: 38 %
Lymphs Abs: 2.6 10*3/uL (ref 0.7–4.0)
MCH: 29 pg (ref 26.0–34.0)
MCHC: 32.3 g/dL (ref 30.0–36.0)
MCV: 89.8 fL (ref 78.0–100.0)
Monocytes Absolute: 0.4 10*3/uL (ref 0.1–1.0)
Monocytes Relative: 6 %
NEUTROS PCT: 55 %
Neutro Abs: 3.8 10*3/uL (ref 1.7–7.7)
PLATELETS: 251 10*3/uL (ref 150–400)
RBC: 4.31 MIL/uL (ref 3.87–5.11)
RDW: 14.7 % (ref 11.5–15.5)
WBC: 6.9 10*3/uL (ref 4.0–10.5)

## 2016-04-19 LAB — I-STAT BETA HCG BLOOD, ED (MC, WL, AP ONLY): I-stat hCG, quantitative: <5 m[IU]/mL (ref <5)

## 2016-04-19 MED ORDER — NAPROXEN 500 MG PO TABS: 500.0000 mg | ORAL_TABLET | Freq: Two times a day (BID) | ORAL | Status: DC | PRN

## 2016-04-19 MED ORDER — SODIUM CHLORIDE 0.9 % IV BOLUS (SEPSIS)
1000.0000 mL | Freq: Once | INTRAVENOUS | Status: AC
  Administered 2016-04-19: 1000 mL via INTRAVENOUS

## 2016-04-19 MED ORDER — ONDANSETRON HCL 4 MG/2ML IJ SOLN
4.0000 mg | Freq: Once | INTRAMUSCULAR | Status: AC
  Administered 2016-04-19: 4 mg via INTRAVENOUS
  Filled 2016-04-19: qty 2

## 2016-04-19 MED ORDER — ONDANSETRON 4 MG PO TBDP: 4.0000 mg | ORAL_TABLET | Freq: Three times a day (TID) | ORAL | Status: DC | PRN

## 2016-04-19 MED ORDER — MORPHINE SULFATE (PF) 4 MG/ML IV SOLN
4.0000 mg | Freq: Once | INTRAVENOUS | Status: AC
  Administered 2016-04-19: 4 mg via INTRAVENOUS
  Filled 2016-04-19: qty 1

## 2016-04-19 NOTE — ED Provider Notes (Signed)
CSN: [id number]     Arrival date & time 04/19/16  1102 History   First MD Initiated Contact with Patient 04/19/16 1109     Chief Complaint  Patient presents with  . Dysuria     (Consider location/radiation/quality/duration/timing/severity/associated sxs/prior Treatment) HPI Comments: April [name] is a 25 y.o. female who is 3.5 months postpartum after c-section for twin delivery on 12/31/15, with a PMHx of depression, GERD, and recurrent UTIs, who presents to the ED with complaints of ongoing lower abdominal pain and dysuria 2 weeks. Patient states that she has had issues ever since her twins were born, and that she was seen in the ER on 04/07/16 and diagnosed with a UTI and given Cipro. She states that she still has 2 tablets left of Cipro because she has not taken them regularly. She believes that her symptoms today are due to an unresolved UTI. She describes her abdominal pain as 10/10 sharp constant left lower quadrant and suprapubic pain radiating into her groin, worse with urination and standing, improved somewhat with oxycodone 5 mg taking 2 tablets at a time, and unimproved with ibuprofen. She states that she ran out of her oxycodone prescription. Associated symptoms include dysuria, increased urgency, and malodorous urine. She also reports that this morning her pain became so severe that she developed nausea and 4 episodes of nonbloody nonbilious emesis, but she strongly believes that this was pain related. She has never followed up with the OB/GYN that delivered her twins, although she was admitted shortly after her delivery for postop infection/hematoma and was seen by OB/GYN staff during that hospitalization. She does have a PCP appointment on 04/28/16.  She denies any fevers, chills, chest pain, shortness of breath, hematemesis, melena, hematochezia, diarrhea, constipation, obstipation, increased urinary frequency, hematuria, vaginal bleeding or discharge, vaginal itching, numbness, tingling,  weakness, recent travel, sick contacts, suspicious food intake, or alcohol use. She does endorse regular NSAID use. LMP 04/07/16  Patient is a 26 y.o. female presenting with dysuria and abdominal pain. The history is provided by the patient and medical records. No language interpreter was used.  Dysuria Associated symptoms: abdominal pain, nausea and vomiting   Associated symptoms: no fever and no vaginal discharge   Abdominal Pain Pain location:  Suprapubic and LLQ Pain quality: sharp   Pain radiates to:  Groin Pain severity:  Severe Onset quality:  Gradual Duration:  2 weeks Timing:  Constant Progression:  Unchanged Chronicity:  Recurrent Context: recent illness (recent UTI)   Context: not recent travel, not sick contacts and not suspicious food intake   Relieved by: oxycodone 5mg  x2. Worsened by:  Urination and position changes Ineffective treatments:  NSAIDs Associated symptoms: dysuria, nausea and vomiting   Associated symptoms: no chest pain, no chills, no constipation, no diarrhea, no fever, no flatus, no hematemesis, no hematochezia, no hematuria, no melena, no shortness of breath, no vaginal bleeding and no vaginal discharge   Risk factors: NSAID use   Risk factors: no alcohol abuse and has not had multiple surgeries     Past Medical History  Diagnosis Date  . Depression   . GERD (gastroesophageal reflux disease)   . UTI (urinary tract infection) in pregnancy in first trimester   . Herpes   . Infection     UTI   Past Surgical History  Procedure Laterality Date  . Dilation and curettage of uterus    . Cesarean section N/A 12/31/2015    Procedure: CESAREAN SECTION;  Surgeon: Catalina Antigua, MD;  Location:  [facility];  Service: Obstetrics;  Laterality: N/A;   Family History  Problem Relation Age of Onset  . Hypertension Maternal Grandmother   . Depression Maternal Grandmother   . Heart disease Maternal Grandmother   . Cancer Maternal Grandfather     lung   Social  History  Substance Use Topics  . Smoking status: Current Every Day Smoker -- 0.25 packs/day for 3 years    Types: Cigarettes    Start date: 04/29/2007  . Smokeless tobacco: Never Used     Comment: Declines help  . Alcohol Use: No   OB History    Gravida Para Term Preterm AB TAB SAB Ectopic Multiple Living   4 2 1 1 2 1 1  1 2      Review of Systems  Constitutional: Negative for fever and chills.  Respiratory: Negative for shortness of breath.   Cardiovascular: Negative for chest pain.  Gastrointestinal: Positive for nausea, vomiting and abdominal pain. Negative for diarrhea, constipation, blood in stool, melena, hematochezia, anal bleeding, flatus and hematemesis.  Genitourinary: Positive for dysuria and urgency. Negative for frequency, hematuria, vaginal bleeding and vaginal discharge.       +malodorous urine  Musculoskeletal: Negative for myalgias and arthralgias.  Skin: Negative for color change.  Allergic/Immunologic: Negative for immunocompromised state.  Neurological: Negative for weakness and numbness.  Psychiatric/Behavioral: Negative for confusion.   10 Systems reviewed and are negative for acute change except as noted in the HPI.    Allergies  Codeine  Home Medications   Prior to Admission medications   Medication Sig Start Date End Date Taking? Authorizing Provider  ciprofloxacin (CIPRO) 500 MG tablet Take 1 tablet (500 mg total) by mouth every 12 (twelve) hours. 04/07/16   Leta Baptist, MD  ferrous sulfate 325 (65 FE) MG tablet Take 1 tablet (325 mg total) by mouth 2 (two) times daily with a meal. Patient not taking: Reported on 04/07/2016 02/12/16   Renne Crigler, PA-C  ibuprofen (ADVIL,MOTRIN) 800 MG tablet Take 1 tablet (800 mg total) by mouth 3 (three) times daily. 04/07/16   Leta Baptist, MD  oxyCODONE-acetaminophen (PERCOCET/ROXICET) 5-325 MG tablet Take 1-2 tablets by mouth every 6 (six) hours as needed for severe pain. 04/07/16   Leta Baptist, MD    BP 115/78 mmHg  Pulse 77  Temp(Src) 97.8 F (36.6 C) (Oral)  Resp 18  Ht 5\' 1"  (1.549 m)  Wt 58.968 kg  BMI 24.58 kg/m2  SpO2 100%  LMP 03/18/2016 Physical Exam  Constitutional: She is oriented to person, place, and time. Vital signs are normal. She appears well-developed and well-nourished.  Non-toxic appearance. No distress.  Afebrile, nontoxic, NAD  HENT:  Head: Normocephalic and atraumatic.  Mouth/Throat: Oropharynx is clear and moist and mucous membranes are normal.  Eyes: Conjunctivae and EOM are normal. Right eye exhibits no discharge. Left eye exhibits no discharge.  Neck: Normal range of motion. Neck supple.  Cardiovascular: Normal rate, regular rhythm, normal heart sounds and intact distal pulses.  Exam reveals no gallop and no friction rub.   No murmur heard. Pulmonary/Chest: Effort normal and breath sounds normal. No respiratory distress. She has no decreased breath sounds. She has no wheezes. She has no rhonchi. She has no rales.  Abdominal: Soft. Normal appearance and bowel sounds are normal. She exhibits no distension. There is tenderness in the right lower quadrant, suprapubic area and left lower quadrant. There is no rigidity, no rebound, no guarding, no CVA tenderness, no tenderness at McBurney's  point and negative Murphy's sign.    Soft, nondistended, +BS throughout, with mild lower abdominal TTP mostly located over the suprapubic region, no r/g/r, neg murphy's, neg mcburney's, no CVA TTP. Well healed surgical incision to the suprapubic area, no overlying skin changes  Musculoskeletal: Normal range of motion.  Neurological: She is alert and oriented to person, place, and time. She has normal strength. No sensory deficit.  Skin: Skin is warm, dry and intact. No rash noted.  Psychiatric: She has a normal mood and affect.  Nursing note and vitals reviewed.   ED Course  Procedures (including critical care time) Labs Review Labs Reviewed  COMPREHENSIVE METABOLIC  PANEL - Abnormal; Notable for the following:    Anion gap 4 (*)    All other components within normal limits  URINE CULTURE  CBC WITH DIFFERENTIAL/PLATELET  URINALYSIS, ROUTINE W REFLEX MICROSCOPIC (NOT AT [facility])  I-STAT BETA HCG BLOOD, ED ([facility], [facility], [facility] ONLY)     Results for orders placed or performed during the hospital encounter of 04/07/16  Urine culture  Result Value Ref Range   Specimen Description URINE, CLEAN CATCH    Special Requests NONE    Culture MULTIPLE SPECIES PRESENT, SUGGEST RECOLLECTION (A)    Report Status 04/08/2016 FINAL    Imaging Review No results found.   US Transvaginal Non-ob  04/07/2016  CLINICAL DATA:  Twin birth 3 months ago by C-section. Heavy bleeding for 4 days. EXAM: TRANSABDOMINAL AND TRANSVAGINAL ULTRASOUND OF PELVIS TECHNIQUE: Both transabdominal and transvaginal ultrasound examinations of the pelvis were performed. Transabdominal technique was performed for global imaging of the pelvis including uterus, ovaries, adnexal regions, and pelvic cul-de-sac. It was necessary to proceed with endovaginal exam following the transabdominal exam to visualize the uterus, endometrium, ovaries and adnexa . COMPARISON:  Ultrasound 02/12/2016.  CT 01/08/2016. FINDINGS: Uterus Measurements: 8.8 x 4.6 x 4.7 cm. No fibroids or other mass visualized. Anterior to the uterus is a 3.9 x 3.8 x 3.7 cm heterogeneous mixed echotexture masslike area. When compared to prior CT, this likely reflects residual hematoma. Endometrium Thickness: 13 mm in thickness.  No focal abnormality visualized. Right ovary Measurements: 2.9 x 2.6 x 2.1 cm. Normal appearance/no adnexal mass. Left ovary Measurements: 2.3 x 1.7 x 1.2 cm. Normal appearance/no adnexal mass. Other findings Small amount of free fluid in the pelvis. IMPRESSION: Complex soft tissue area anterior to the uterus in the region of the C-section scar, likely residual hematoma. This measures up to 3.9 cm. On prior CT, this measured up to 10.7  cm. Electronically Signed   By: Charlett Nose M.D.   On: 04/07/2016 16:00   US Pelvis Complete  04/07/2016  CLINICAL DATA:  Twin birth 3 months ago by C-section. Heavy bleeding for 4 days. EXAM: TRANSABDOMINAL AND TRANSVAGINAL ULTRASOUND OF PELVIS TECHNIQUE: Both transabdominal and transvaginal ultrasound examinations of the pelvis were performed. Transabdominal technique was performed for global imaging of the pelvis including uterus, ovaries, adnexal regions, and pelvic cul-de-sac. It was necessary to proceed with endovaginal exam following the transabdominal exam to visualize the uterus, endometrium, ovaries and adnexa . COMPARISON:  Ultrasound 02/12/2016.  CT 01/08/2016. FINDINGS: Uterus Measurements: 8.8 x 4.6 x 4.7 cm. No fibroids or other mass visualized. Anterior to the uterus is a 3.9 x 3.8 x 3.7 cm heterogeneous mixed echotexture masslike area. When compared to prior CT, this likely reflects residual hematoma. Endometrium Thickness: 13 mm in thickness.  No focal abnormality visualized. Right ovary Measurements: 2.9 x 2.6 x 2.1 cm.  Normal appearance/no adnexal mass. Left ovary Measurements: 2.3 x 1.7 x 1.2 cm. Normal appearance/no adnexal mass. Other findings Small amount of free fluid in the pelvis. IMPRESSION: Complex soft tissue area anterior to the uterus in the region of the C-section scar, likely residual hematoma. This measures up to 3.9 cm. On prior CT, this measured up to 10.7 cm. Electronically Signed   By: Charlett Nose M.D.   On: 04/07/2016 16:00     I have personally reviewed and evaluated these images and lab results as part of my medical decision-making.   EKG Interpretation None      MDM   Final diagnoses:  Chronic abdominal pain  Incisional pain  Dysuria  Non-intractable vomiting with nausea, vomiting of unspecified type    25 y.o. female here with ongoing dysuria and lower abdominal pain x2wks, was seen on 04/07/16 for similar symptoms and didn't take the cipro she was  given according to it's instructions, therefore she still has some left that she's taking; states she thinks this is still an ongoing UTI. On exam, very mild tenderness in the lower abdomen, mostly in the suprapubic area, nonperitoneal. She vomited this morning but thinks it's due to pain. On the 04/07/16 visit she had a pelvic exam, do not feel we need to repeat this; pelvic U/S that day revealed resolving hematoma along c-section scar, doubt we need to repeat imaging today. UCx from last visit had multiple species grown, suggested recollection. Will get CBC and CMP, U/A, UCx, istat beta HCG, and give fluids/zofran/morphine for symptom control. Will reassess shortly  12:46 PM U/A remarkably clean, cipro may be working despite not taking it appropriately. Otherwise labs completely unremarkable. Discussed that this could still just be incisional pain and a resolving UTI, continue taking cipro, use naprosyn/tylenol for pain, and heat and massage along the incision to help with break up of adhesive scar tissue, etc. At that point she became upset that she wasn't getting narcotics, had a long conversation about the fact that narcotics aren't indicated for chronic pain, and that she needs to f/up with OBGYN and her PCP, and could discuss chronic narcotic regimens with them if indicated. She then stated she wanted her IV out so she could leave because she "needs something for her pain". I discussed again that she is being given NSAID and instructed to use tylenol/heat/massage for pain, but she demanded to have her IV out so she could leave and was upset that narcotics weren't being prescribed. Rx for zofran given, pt tolerated PO here in the ER prior to discharge. Instructed to f/up with PCP and OBGYN in 1wk. I explained the diagnosis and have given explicit precautions to return to the ER including for any other new or worsening symptoms. The patient understands and accepts the medical plan as it's been dictated and I  have answered their questions. Discharge instructions concerning home care and prescriptions have been given. The patient is STABLE and is discharged to home in good condition.  BP 110/78 mmHg  Pulse 49  Temp(Src) 97.8 F (36.6 C) (Oral)  Resp 16  Ht 5\' 1"  (1.549 m)  Wt 58.968 kg  BMI 24.58 kg/m2  SpO2 100%  LMP 03/18/2016  Meds ordered this encounter  Medications  . sodium chloride 0.9 % bolus 1,000 mL    Sig:   . ondansetron (ZOFRAN) injection 4 mg    Sig:   . morphine 4 MG/ML injection 4 mg    Sig:   . ondansetron (ZOFRAN  ODT) 4 MG disintegrating tablet    Sig: Take 1 tablet (4 mg total) by mouth every 8 (eight) hours as needed for nausea or vomiting.    Dispense:  15 tablet    Refill:  0    Order Specific Question:  Supervising Provider    Answer:  MILLER, BRIAN [[id number]]  . naproxen (NAPROSYN) 500 MG tablet    Sig: Take 1 tablet (500 mg total) by mouth 2 (two) times daily as needed for mild pain or moderate pain (TAKE WITH MEALS.).    Dispense:  20 tablet    Refill:  0    Order Specific Question:  Supervising Provider    Answer:  Eber Hong [[id number]]       Saphia Vanderford Camprubi-Soms, PA-C 04/19/16 1249  Azalia Bilis, MD 04/19/16 1714

## 2016-04-19 NOTE — ED Notes (Addendum)
Patient reports dysuria x 1 1/2 weeks and abdominal pain. Patient states she has had issues with both since twins born 4 months ago. Patient denies having a fever. Patient states she vomited x 4 today as well.

## 2016-04-19 NOTE — ED Notes (Signed)
Pt made aware of need for urine sample, call light at bedside.

## 2016-04-19 NOTE — ED Notes (Signed)
Patient had a mild histamine reaction to Morphine along IV track.  No distress.

## 2016-04-19 NOTE — Discharge Instructions (Signed)
Abdominal (belly) pain can be caused by many things. Your symptoms are likely from ongoing incisional pain from your c-section, and a resolving urinary tract infection. Continue your home cipro as directed. Use zofran as directed as needed for nausea. Stay well hydrated. Use naprosyn and tylenol as needed for pain. Use heat to the area to help with pain. Follow up with your regular doctor in 1 week for recheck and ongoing management of your chronic pain. Follow up with your OBGYN for ongoing management of your incisional pain/issues. [facility] for emergent changes or worsening symptoms.  Your caregiver performed an examination and possibly ordered blood/urine tests and imaging (CT scan, x-rays, ultrasound). Many cases can be observed and treated at home after initial evaluation in the emergency department. Even though you are being discharged home, abdominal pain can be unpredictable. Therefore, you need a repeated exam if your pain does not resolve, returns, or worsens. Most patients with abdominal pain don't have to be admitted to the hospital or have surgery, but serious problems like appendicitis and gallbladder attacks can start out as nonspecific pain. Many abdominal conditions cannot be diagnosed in one visit, so follow-up evaluations are very important. SEEK IMMEDIATE MEDICAL ATTENTION IF YOU DEVELOP ANY OF THE FOLLOWING SYMPTOMS:  The pain does not go away or becomes severe.   A temperature above 101 develops.   Repeated vomiting occurs (multiple episodes).   The pain becomes localized to portions of the abdomen. The right side could possibly be appendicitis. In an adult, the left lower portion of the abdomen could be colitis or diverticulitis.   Blood is being passed in stools or vomit (bright red or black tarry stools).   Return also if you develop chest pain, difficulty breathing, dizziness or fainting, or become confused, poorly responsive, or inconsolable (young  children).  The constipation stays for more than 4 days.   There is belly (abdominal) or rectal pain.   You do not seem to be getting better.     Abdominal Pain, Adult Many things can cause belly (abdominal) pain. Most times, the belly pain is not dangerous. Many cases of belly pain can be watched and treated at home. HOME CARE   Do not take medicines that help you go poop (laxatives) unless told to by your doctor.  Only take medicine as told by your doctor.  Eat or drink as told by your doctor. Your doctor will tell you if you should be on a special diet. GET HELP IF:  You do not know what is causing your belly pain.  You have belly pain while you are sick to your stomach (nauseous) or have runny poop (diarrhea).  You have pain while you pee or poop.  Your belly pain wakes you up at night.  You have belly pain that gets worse or better when you eat.  You have belly pain that gets worse when you eat fatty foods.  You have a fever. GET HELP RIGHT AWAY IF:   The pain does not go away within 2 hours.  You keep throwing up (vomiting).  The pain changes and is only in the right or left part of the belly.  You have bloody or tarry looking poop. MAKE SURE YOU:   Understand these instructions.  Will watch your condition.  Will get help right away if you are not doing well or get worse.   This information is not intended to replace advice given to you by your health care  provider. Make sure you discuss any questions you have with your health care provider.   Document Released: 03/03/2008 Document Revised: 10/06/2014 Document Reviewed: 05/25/2013 Elsevier Interactive Patient Education 2016 Elsevier Inc.  Dysuria Dysuria is pain or discomfort while urinating. The pain or discomfort may be felt in the tube that carries urine out of the bladder (urethra) or in the surrounding tissue of the genitals. The pain may also be felt in the groin area, lower abdomen, and lower  back. You may have to urinate frequently or have the sudden feeling that you have to urinate (urgency). Dysuria can affect both men and women, but is more common in women. Dysuria can be caused by many different things, including:  Urinary tract infection in women.  Infection of the kidney or bladder.  Kidney stones or bladder stones.  Certain sexually transmitted infections (STIs), such as chlamydia.  Dehydration.  Inflammation of the vagina.  Use of certain medicines.  Use of certain soaps or scented products that cause irritation. HOME CARE INSTRUCTIONS Watch your dysuria for any changes. The following actions may help to reduce any discomfort you are feeling:  Drink enough fluid to keep your urine clear or pale yellow.  Empty your bladder often. Avoid holding urine for long periods of time.  After a bowel movement or urination, women should cleanse from front to back, using each tissue only once.  Empty your bladder after sexual intercourse.  Take medicines only as directed by your health care provider.  If you were prescribed an antibiotic medicine, finish it all even if you start to feel better.  Avoid caffeine, tea, and alcohol. They can irritate the bladder and make dysuria worse. In men, alcohol may irritate the prostate.  Keep all follow-up visits as directed by your health care provider. This is important.  If you had any tests done to find the cause of dysuria, it is your responsibility to obtain your test results. Ask the lab or department performing the test when and how you will get your results. Talk with your health care provider if you have any questions about your results. SEEK MEDICAL CARE IF:  You develop pain in your back or sides.  You have a fever.  You have nausea or vomiting.  You have blood in your urine.  You are not urinating as often as you usually do. SEEK IMMEDIATE MEDICAL CARE IF:  You pain is severe and not relieved with  medicines.  You are unable to hold down any fluids.  You or someone else notices a change in your mental function.  You have a rapid heartbeat at rest.  You have shaking or chills.  You feel extremely weak.   This information is not intended to replace advice given to you by your health care provider. Make sure you discuss any questions you have with your health care provider.   Document Released: 06/13/2004 Document Revised: 10/06/2014 Document Reviewed: 05/11/2014 Elsevier Interactive Patient Education 2016 Elsevier Inc.  Nausea and Vomiting Nausea is a sick feeling that often comes before throwing up (vomiting). Vomiting is a reflex where stomach contents come out of your mouth. Vomiting can cause severe loss of body fluids (dehydration). Children and elderly adults can become dehydrated quickly, especially if they also have diarrhea. Nausea and vomiting are symptoms of a condition or disease. It is important to find the cause of your symptoms. CAUSES   Direct irritation of the stomach lining. This irritation can result from increased acid production (gastroesophageal reflux  disease), infection, food poisoning, taking certain medicines (such as nonsteroidal anti-inflammatory drugs), alcohol use, or tobacco use.  Signals from the brain.These signals could be caused by a headache, heat exposure, an inner ear disturbance, increased pressure in the brain from injury, infection, a tumor, or a concussion, pain, emotional stimulus, or metabolic problems.  An obstruction in the gastrointestinal tract (bowel obstruction).  Illnesses such as diabetes, hepatitis, gallbladder problems, appendicitis, kidney problems, cancer, sepsis, atypical symptoms of a heart attack, or eating disorders.  Medical treatments such as chemotherapy and radiation.  Receiving medicine that makes you sleep (general anesthetic) during surgery. DIAGNOSIS Your caregiver may ask for tests to be done if the problems  do not improve after a few days. Tests may also be done if symptoms are severe or if the reason for the nausea and vomiting is not clear. Tests may include:  Urine tests.  Blood tests.  Stool tests.  Cultures (to look for evidence of infection).  X-rays or other imaging studies. Test results can help your caregiver make decisions about treatment or the need for additional tests. TREATMENT You need to stay well hydrated. Drink frequently but in small amounts.You may wish to drink water, sports drinks, clear broth, or eat frozen ice pops or gelatin dessert to help stay hydrated.When you eat, eating slowly may help prevent nausea.There are also some antinausea medicines that may help prevent nausea. HOME CARE INSTRUCTIONS   Take all medicine as directed by your caregiver.  If you do not have an appetite, do not force yourself to eat. However, you must continue to drink fluids.  If you have an appetite, eat a normal diet unless your caregiver tells you differently.  Eat a variety of complex carbohydrates (rice, wheat, potatoes, bread), lean meats, yogurt, fruits, and vegetables.  Avoid high-fat foods because they are more difficult to digest.  Drink enough water and fluids to keep your urine clear or pale yellow.  If you are dehydrated, ask your caregiver for specific rehydration instructions. Signs of dehydration may include:  Severe thirst.  Dry lips and mouth.  Dizziness.  Dark urine.  Decreasing urine frequency and amount.  Confusion.  Rapid breathing or pulse. SEEK IMMEDIATE MEDICAL CARE IF:   You have blood or brown flecks (like coffee grounds) in your vomit.  You have black or bloody stools.  You have a severe headache or stiff neck.  You are confused.  You have severe abdominal pain.  You have chest pain or trouble breathing.  You do not urinate at least once every 8 hours.  You develop cold or clammy skin.  You continue to vomit for longer than 24  to 48 hours.  You have a fever. MAKE SURE YOU:   Understand these instructions.  Will watch your condition.  Will get help right away if you are not doing well or get worse.   This information is not intended to replace advice given to you by your health care provider. Make sure you discuss any questions you have with your health care provider.   Document Released: 09/15/2005 Document Revised: 12/08/2011 Document Reviewed: 02/12/2011 Elsevier Interactive Patient Education [employer]

## 2016-04-20 LAB — URINE CULTURE: CULTURE: NO GROWTH

## 2016-04-30 ENCOUNTER — Telehealth: Payer: Self-pay | Admitting: *Deleted

## 2016-04-30 NOTE — Telephone Encounter (Signed)
Received a voicemail message on nurse line on 04/30/16 at 1413.  Haywood Lasso from [facility] states patient delivered twins in April and has 2 hematomas.  Would like to schedule patient an appointment ASAP.  Requests a return call to [phone].  Will forward message to registration staff to schedule appointment.

## 2016-05-07 ENCOUNTER — Ambulatory Visit: Payer: Medicaid Other | Admitting: Obstetrics & Gynecology

## 2016-05-08 ENCOUNTER — Encounter: Payer: Medicaid Other | Admitting: Family Medicine

## 2016-11-11 ENCOUNTER — Encounter (HOSPITAL_COMMUNITY): Payer: Self-pay

## 2016-11-11 ENCOUNTER — Emergency Department (HOSPITAL_COMMUNITY)
Admission: EM | Admit: 2016-11-11 | Discharge: 2016-11-11 | Discharge status: Against Medical Advice | Payer: Medicaid Other | Attending: Emergency Medicine | Admitting: Emergency Medicine

## 2016-11-11 ENCOUNTER — Emergency Department (HOSPITAL_COMMUNITY): Payer: Medicaid Other

## 2016-11-11 DIAGNOSIS — Z3201 Encounter for pregnancy test, result positive: Secondary | ICD-10-CM | POA: Insufficient documentation

## 2016-11-11 DIAGNOSIS — R69 Illness, unspecified: Secondary | ICD-10-CM

## 2016-11-11 DIAGNOSIS — J111 Influenza due to unidentified influenza virus with other respiratory manifestations: Secondary | ICD-10-CM | POA: Diagnosis not present

## 2016-11-11 DIAGNOSIS — R112 Nausea with vomiting, unspecified: Secondary | ICD-10-CM

## 2016-11-11 DIAGNOSIS — F17210 Nicotine dependence, cigarettes, uncomplicated: Secondary | ICD-10-CM | POA: Insufficient documentation

## 2016-11-11 DIAGNOSIS — R102 Pelvic and perineal pain: Secondary | ICD-10-CM | POA: Insufficient documentation

## 2016-11-11 LAB — URINALYSIS, ROUTINE W REFLEX MICROSCOPIC
Bilirubin Urine: NEGATIVE
GLUCOSE, UA: NEGATIVE mg/dL
Hgb urine dipstick: NEGATIVE
Ketones, ur: 5 mg/dL — AB
Leukocytes, UA: NEGATIVE
Nitrite: NEGATIVE
Protein, ur: NEGATIVE mg/dL
SPECIFIC GRAVITY, URINE: 1.013 (ref 1.005–1.030)
pH: 6 (ref 5.0–8.0)

## 2016-11-11 LAB — CBC WITH DIFFERENTIAL/PLATELET
Basophils Absolute: 0 10*3/uL (ref 0.0–0.1)
Basophils Relative: 0 %
Eosinophils Absolute: 0.1 10*3/uL (ref 0.0–0.7)
Eosinophils Relative: 1 %
HEMATOCRIT: 37.1 % (ref 36.0–46.0)
HEMOGLOBIN: 12.6 g/dL (ref 12.0–15.0)
LYMPHS ABS: 3.3 10*3/uL (ref 0.7–4.0)
LYMPHS PCT: 33 %
MCH: 28.6 pg (ref 26.0–34.0)
MCHC: 34 g/dL (ref 30.0–36.0)
MCV: 84.1 fL (ref 78.0–100.0)
Monocytes Absolute: 0.5 10*3/uL (ref 0.1–1.0)
Monocytes Relative: 5 %
NEUTROS PCT: 61 %
Neutro Abs: 6 10*3/uL (ref 1.7–7.7)
Platelets: 311 10*3/uL (ref 150–400)
RBC: 4.41 MIL/uL (ref 3.87–5.11)
RDW: 13.1 % (ref 11.5–15.5)
WBC: 10 10*3/uL (ref 4.0–10.5)

## 2016-11-11 LAB — COMPREHENSIVE METABOLIC PANEL
ALT: 31 U/L (ref 14–54)
AST: 31 U/L (ref 15–41)
Albumin: 4.8 g/dL (ref 3.5–5.0)
Alkaline Phosphatase: 78 U/L (ref 38–126)
Anion gap: 8 (ref 5–15)
BUN: 5 mg/dL — AB (ref 6–20)
CHLORIDE: 104 mmol/L (ref 101–111)
CO2: 25 mmol/L (ref 22–32)
Calcium: 9.8 mg/dL (ref 8.9–10.3)
Creatinine, Ser: 0.49 mg/dL (ref 0.44–1.00)
GFR calc Af Amer: >60 mL/min (ref >60)
Glucose, Bld: 82 mg/dL (ref 65–99)
POTASSIUM: 3.2 mmol/L — AB (ref 3.5–5.1)
Sodium: 137 mmol/L (ref 135–145)
Total Bilirubin: 0.5 mg/dL (ref 0.3–1.2)
Total Protein: 8.2 g/dL — ABNORMAL HIGH (ref 6.5–8.1)

## 2016-11-11 LAB — HCG, QUANTITATIVE, PREGNANCY: HCG, BETA CHAIN, QUANT, S: 21945 m[IU]/mL — AB (ref <5)

## 2016-11-11 LAB — I-STAT BETA HCG BLOOD, ED (MC, WL, AP ONLY): I-stat hCG, quantitative: >2000 m[IU]/mL — ABNORMAL HIGH (ref <5)

## 2016-11-11 MED ORDER — PRENATAL VITAMINS 28-0.8 MG PO TABS: 1.0000 | ORAL_TABLET | ORAL | 0 refills | Status: DC

## 2016-11-11 MED ORDER — MECLIZINE HCL 25 MG PO TABS: 25.0000 mg | ORAL_TABLET | Freq: Three times a day (TID) | ORAL | 0 refills | Status: DC | PRN

## 2016-11-11 MED ORDER — SODIUM CHLORIDE 0.9 % IV BOLUS (SEPSIS)
1000.0000 mL | Freq: Once | INTRAVENOUS | Status: AC
  Administered 2016-11-11: 1000 mL via INTRAVENOUS

## 2016-11-11 MED ORDER — ACETAMINOPHEN 500 MG PO TABS: 500.0000 mg | ORAL_TABLET | Freq: Four times a day (QID) | ORAL | 0 refills | Status: DC | PRN

## 2016-11-11 MED ORDER — ACETAMINOPHEN 325 MG PO TABS
650.0000 mg | ORAL_TABLET | Freq: Once | ORAL | Status: AC
  Administered 2016-11-11: 650 mg via ORAL
  Filled 2016-11-11: qty 2

## 2016-11-11 MED ORDER — PROMETHAZINE HCL 25 MG/ML IJ SOLN
25.0000 mg | Freq: Once | INTRAMUSCULAR | Status: AC
Start: 2016-11-11 — End: 2016-11-11
  Administered 2016-11-11: 25 mg via INTRAVENOUS
  Filled 2016-11-11: qty 1

## 2016-11-11 NOTE — ED Provider Notes (Signed)
[facility]-EMERGENCY DEPT Provider Note   CSN: [id number] Arrival date & time: 11/11/16  1713     History   Chief Complaint Chief Complaint  Patient presents with  . Emesis    HPI ASHETON VIRAMONTES is a 26 y.o. female.  LANAI CONLEE is a 26 y.o. Female who presents to the ED complaining of flu like symptoms for the past 4 days. Patient reports 4 days ago she began having subjective fevers, body aches, sneezing, nasal congestion, postnasal drip, ear pressure, and some slight cough. She was seen in the ER at [facility] 4 days ago and started on tamiflu and began having nausea and vomiting. She reports stopping the Tamiflu but her nausea and vomiting has persisted. She complains of flulike symptoms with associated nausea and vomiting today. She reports generalized abdominal pain. She reports taking Tylenol and Zofran early this morning with little relief. She reports only slight coughing. No wheezing or shortness of breath. She denies hematemesis, diarrhea, urinary symptoms, chest pain, shortness of breath, hemoptysis, or rashes.   The history is provided by the patient. No language interpreter was used.  Emesis   Associated symptoms include abdominal pain, cough, a fever (Subjective.) and myalgias. Pertinent negatives include no diarrhea and no headaches.    Past Medical History:  Diagnosis Date  . Depression   . GERD (gastroesophageal reflux disease)   . Herpes   . Infection    UTI  . UTI (urinary tract infection) in pregnancy in first trimester     Patient Active Problem List   Diagnosis Date Noted  . Lower leg edema   . Hypertension in pregnancy, postpartum condition 01/09/2016  . Pelvic hematoma, delivered with postpartum complication 01/09/2016  . Sepsis (HCC) 01/08/2016  . S/P cesarean section 12/31/2015  . Rubella non-immune status, antepartum 12/22/2015  . Dichorionic diamniotic twin pregnancy, antepartum 12/22/2015  . 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 Back pain  affecting pregnancy in second trimester 10/10/2015  . Twin pregnancy, antepartum condition or complication 06/28/2015  . Menorrhagia 07/07/2013  . Sleep difficulties 11/07/2011  . Depressive disorder 10/23/2011  . ABNORMAL VAGINAL BLEEDING 07/17/2008  . RhD negative 03/07/2008  . UTI'S, HX OF 03/07/2008  . TOBACCO ABUSE 10/27/2007    Past Surgical History:  Procedure Laterality Date  . CESAREAN SECTION N/A 12/31/2015   Procedure: CESAREAN SECTION;  Surgeon: Catalina Antigua, MD;  Location: [facility] ORS;  Service: Obstetrics;  Laterality: N/A;  . DILATION AND CURETTAGE OF UTERUS      OB History    Gravida Para Term Preterm AB Living   4 2 1 1 2 2    SAB TAB Ectopic Multiple Live Births   1 1   1 2        Home Medications    Prior to Admission medications   Medication Sig Start Date End Date Taking? Authorizing Provider  ibuprofen (ADVIL,MOTRIN) 200 MG tablet Take 200 mg by mouth every 4 (four) hours as needed for fever, headache, mild pain, moderate pain or cramping.   Yes Historical Provider, MD  ondansetron (ZOFRAN-ODT) 4 MG disintegrating tablet Take 4 mg by mouth every 8 (eight) hours as needed for nausea or vomiting.   Yes Historical Provider, MD  acetaminophen (TYLENOL) 500 MG tablet Take 1 tablet (500 mg total) by mouth every 6 (six) hours as needed. 11/11/16   Everlene Farrier, PA-C  ferrous sulfate 325 (65 FE) MG tablet Take 1 tablet (325 mg total) by mouth 2 (two) times daily with a meal. Patient  not taking: Reported on 04/07/2016 02/12/16   Renne CriglerJoshua Geiple, PA-C  meclizine (ANTIVERT) 25 MG tablet Take 1 tablet (25 mg total) by mouth 3 (three) times daily as needed for dizziness. 11/11/16   Everlene FarrierWilliam Sohan Potvin, PA-C  Prenatal Vit-Fe Fumarate-FA (PRENATAL VITAMINS) 28-0.8 MG TABS Take 1 tablet by mouth as directed. 11/11/16   Everlene FarrierWilliam Lorren Rossetti, PA-C    Family History Family History  Problem Relation Age of Onset  . Hypertension Maternal Grandmother   . Depression Maternal Grandmother   .  Heart disease Maternal Grandmother   . Cancer Maternal Grandfather     lung    Social History Social History  Substance Use Topics  . Smoking status: Current Every Day Smoker    Packs/day: 0.25    Years: 3.00    Types: Cigarettes    Start date: 04/29/2007  . Smokeless tobacco: Never Used     Comment: Declines help  . Alcohol use No     Allergies   Nsaids; Tamiflu [oseltamivir phosphate]; and Codeine   Review of Systems Review of Systems  Constitutional: Positive for fatigue and fever (Subjective.).  HENT: Positive for congestion, ear pain, postnasal drip, rhinorrhea and sneezing. Negative for mouth sores, sore throat and trouble swallowing.   Eyes: Negative for visual disturbance.  Respiratory: Positive for cough. Negative for shortness of breath and wheezing.   Cardiovascular: Negative for chest pain.  Gastrointestinal: Positive for abdominal pain, nausea and vomiting. Negative for blood in stool and diarrhea.  Genitourinary: Negative for difficulty urinating, dysuria, flank pain, frequency, hematuria and urgency.  Musculoskeletal: Positive for myalgias. Negative for neck pain and neck stiffness.  Skin: Negative for rash.  Neurological: Negative for headaches.     Physical Exam Updated Vital Signs BP 100/60 (BP Location: Left Arm)   Pulse 91   Temp 98.3 F (36.8 C) (Oral)   Resp 18   LMP 10/07/2016   SpO2 100%   Physical Exam  Constitutional: She appears well-developed and well-nourished. No distress.  Nontoxic appearing.  HENT:  Head: Normocephalic and atraumatic.  Right Ear: External ear normal.  Left Ear: External ear normal.  Mouth/Throat: Oropharynx is clear and moist.  No middle ear effusion noted bilaterally. No TM erythema or loss of landmarks. Boggy nasal turbinates bilaterally. Rhinorrhea present. There is clear. No tonsillar hypertrophy exudate. Evidence of postnasal drip in her posterior oropharynx.  Eyes: Conjunctivae are normal. Pupils are  equal, round, and reactive to light. Right eye exhibits no discharge. Left eye exhibits no discharge.  Neck: Normal range of motion. Neck supple. No JVD present. No tracheal deviation present.  Cardiovascular: Normal rate, regular rhythm, normal heart sounds and intact distal pulses.  Exam reveals no gallop and no friction rub.   No murmur heard. Pulmonary/Chest: Effort normal and breath sounds normal. No stridor. No respiratory distress. She has no wheezes. She has no rales.  Lungs clear to auscultation bilaterally. No increased work of breathing. No rales or rhonchi.  Abdominal: Soft. Bowel sounds are normal. She exhibits no distension and no mass. There is tenderness. There is no rebound and no guarding.  Abdomen is soft and bowel sounds are present. She is mild generalized abdominal tenderness to palpation without focal tenderness. No peritoneal signs.  Musculoskeletal: She exhibits no edema.  Lymphadenopathy:    She has no cervical adenopathy.  Neurological: She is alert. Coordination normal.  Skin: Skin is warm and dry. Capillary refill takes less than 2 seconds. No rash noted. She is not diaphoretic. No erythema. No  pallor.  Psychiatric: She has a normal mood and affect. Her behavior is normal.  Nursing note and vitals reviewed.    ED Treatments / Results  Labs (all labs ordered are listed, but only abnormal results are displayed) Labs Reviewed  COMPREHENSIVE METABOLIC PANEL - Abnormal; Notable for the following:       Result Value   Potassium 3.2 (*)    BUN 5 (*)    Total Protein 8.2 (*)    All other components within normal limits  URINALYSIS, ROUTINE W REFLEX MICROSCOPIC - Abnormal; Notable for the following:    APPearance HAZY (*)    Ketones, ur 5 (*)    All other components within normal limits  I-STAT BETA HCG BLOOD, ED ([facility], [facility], [facility] ONLY) - Abnormal; Notable for the following:    I-stat hCG, quantitative >2,000.0 (*)    All other components within normal limits  CBC  WITH DIFFERENTIAL/PLATELET  HCG, QUANTITATIVE, PREGNANCY    EKG  EKG Interpretation None       Radiology No results found.  Procedures Procedures (including critical care time)  Medications Ordered in ED Medications  sodium chloride 0.9 % bolus 1,000 mL (1,000 mLs Intravenous New Bag/Given 11/11/16 2108)  promethazine (PHENERGAN) injection 25 mg (25 mg Intravenous Given 11/11/16 2135)  acetaminophen (TYLENOL) tablet 650 mg (650 mg Oral Given 11/11/16 2202)     Initial Impression / Assessment and Plan / ED Course  I have reviewed the triage vital signs and the nursing notes.  Pertinent labs & imaging results that were available during my care of the patient were reviewed by me and considered in my medical decision making (see chart for details).    This  is a 26 y.o. Female who presents to the ED complaining of flu like symptoms for the past 4 days. Patient reports 4 days ago she began having subjective fevers, body aches, sneezing, nasal congestion, postnasal drip, ear pressure, and some slight cough. She was seen in the ER at [facility] 4 days ago and started on tamiflu and began having nausea and vomiting. She reports stopping the Tamiflu but her nausea and vomiting has persisted. She complains of flulike symptoms with associated nausea and vomiting today. She reports generalized abdominal pain. She reports taking Tylenol and Zofran early this morning with little relief. She reports only slight coughing. No wheezing or shortness of breath. On exam the patient is afebrile nontoxic appearing. Lungs clear to auscultation bilaterally. Abdomen is soft and she has mild generalized abdominal tenderness to palpation. No peritoneal signs. Urinalysis is without sign of infection. CBC is unremarkable. His CMP is unremarkable. Pregnancy test returned positive. Will obtain ultrasound to rule out ectopic pregnancy due to the patient's nausea abdominal pain. She denies any vaginal bleeding or  vaginal discharge. Initially, she agrees to pelvic ultrasound. Later, I was called to bedside as the patient reports she does not want the ultrasound. She tells me she is tired and feeling better and wants to go home. She is tolerated to say much about vomiting or nausea. I discussed the reasoning again behind doing an ultrasound. I discussed the risk of ectopic pregnancy. I discussed that the risk is her life. I discussed that ectopic pregnancy is deadly. Patient still declines ultrasound and requests to leave. Patient will sign out AMA. I encouraged close follow-up with her OB/GYN. Discussed strict and specific return precautions. She declines Tamiflu as this caused bad side effects. I advised the patient to follow-up with their primary care  provider this week. I advised the patient to return to the emergency department with new or worsening symptoms or new concerns.   Final Clinical Impressions(s) / ED Diagnoses   Final diagnoses:  Pelvic pain  Positive pregnancy test  Non-intractable vomiting with nausea, unspecified vomiting type  Influenza-like illness    New Prescriptions New Prescriptions   ACETAMINOPHEN (TYLENOL) 500 MG TABLET    Take 1 tablet (500 mg total) by mouth every 6 (six) hours as needed.   MECLIZINE (ANTIVERT) 25 MG TABLET    Take 1 tablet (25 mg total) by mouth 3 (three) times daily as needed for dizziness.   PRENATAL VIT-FE FUMARATE-FA (PRENATAL VITAMINS) 28-0.8 MG TABS    Take 1 tablet by mouth as directed.     Everlene Farrier, PA-C 11/11/16 2215    Lyndal Pulley, MD 11/12/16 [phone]

## 2016-11-11 NOTE — ED Triage Notes (Signed)
[facility]  Pt states they did not test her.  Pt started having emesis and body aches on Friday night.  Can't keep fluids down.  Pt given zofran and it is not working.

## 2016-11-11 NOTE — ED Notes (Signed)
Patient decided to leave AMA, refusing ultrasound to r/o ectopic pregnancy. Discharge instructions, prescriptions, and risks of leaving without exam discussed with patient. Patient given opportunity to ask questions, patient a/o and in no distress.

## 2017-01-29 ENCOUNTER — Encounter (HOSPITAL_COMMUNITY): Payer: Self-pay | Admitting: Obstetrics and Gynecology

## 2017-03-25 IMAGING — US US OB TRANSVAGINAL
1 series · 15 of 28 positions shown · non-contrast
Comparison: None.

CLINICAL DATA: Pelvic pain, cramps, and back pain for 1 month.
Diagnosed with urinary tract infection tonight. Nausea and vomiting.
Estimated gestational age by LMP is 9 weeks 3 days. Quantitative
beta HCG is not recorded.

EXAM:
TWIN OBSTETRIC <14WK US AND TRANSVAGINAL OB US

[Series 1: us ob transvaginal · 115 acquisitions, 15 frames shown]
[im 1/115]
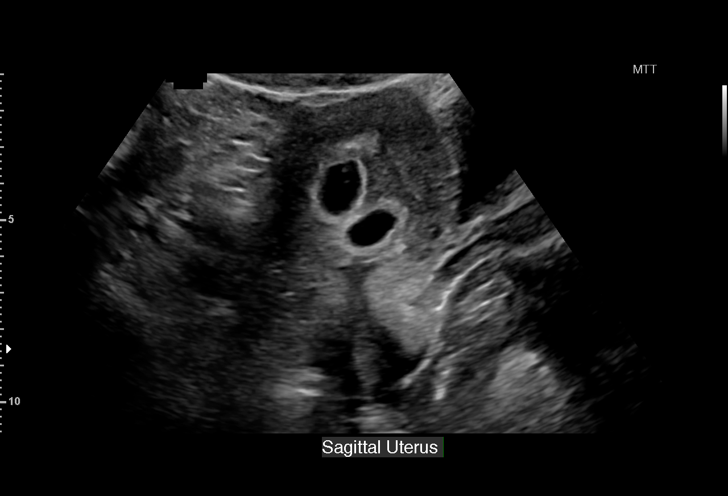
[im 9/115]
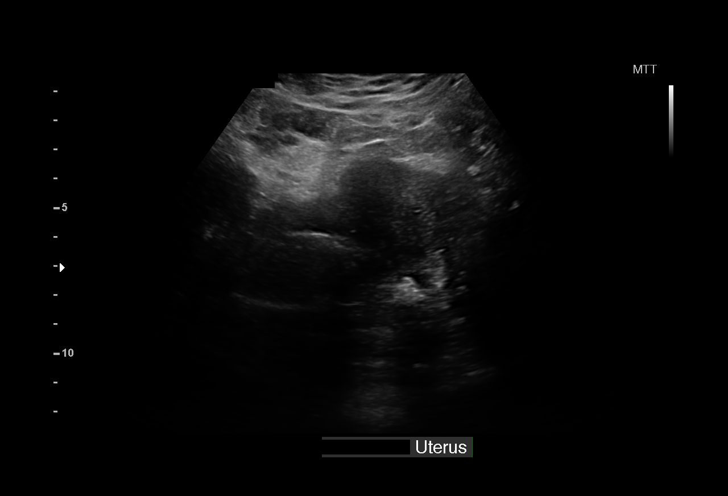
[im 17/115]
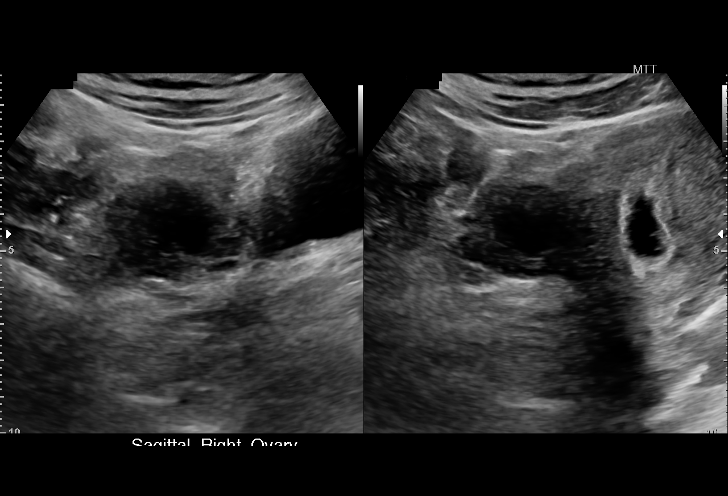
[im 26/115]
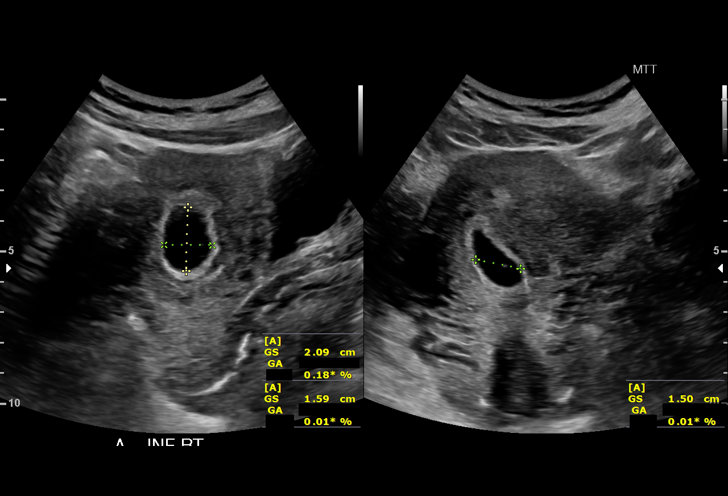
[im 34/115]
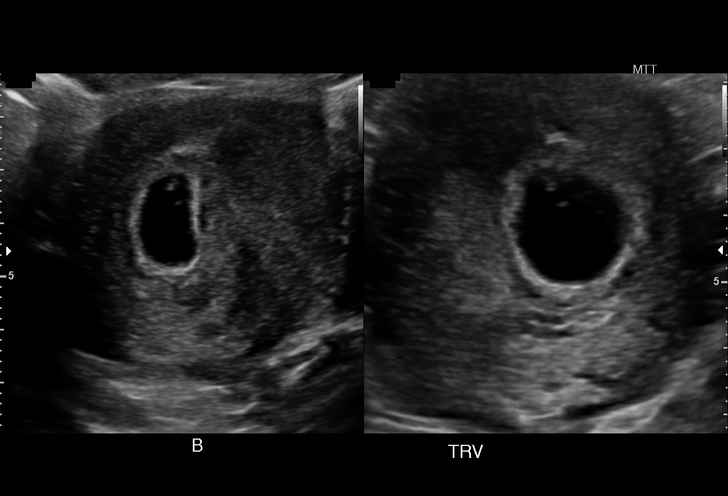
[im 43/115]
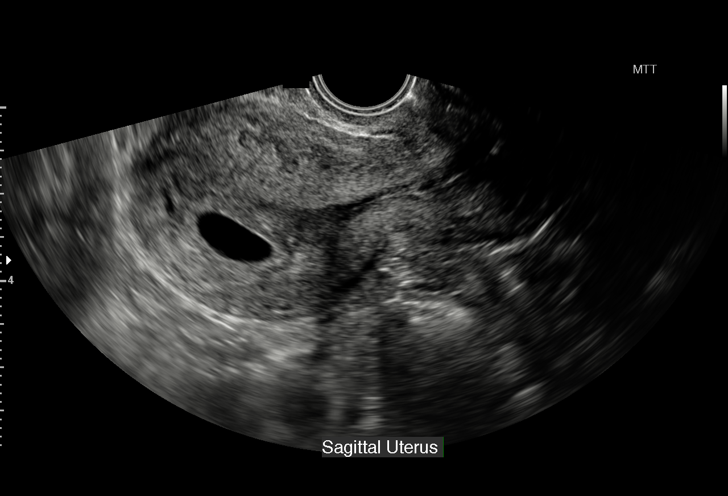
[im 51/115]
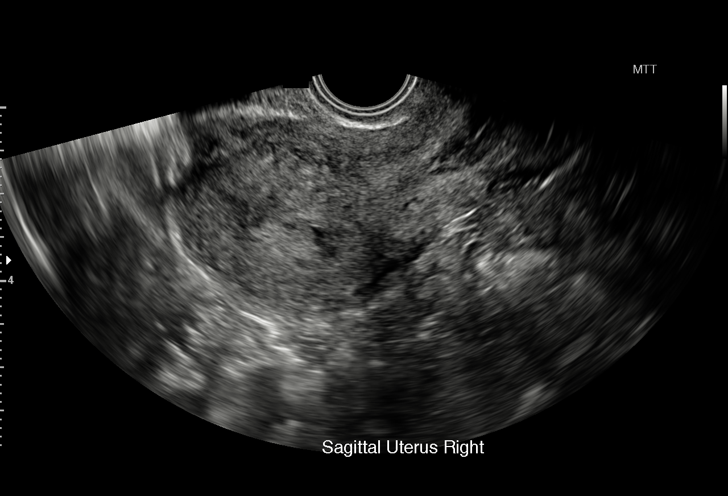
[im 60/115]
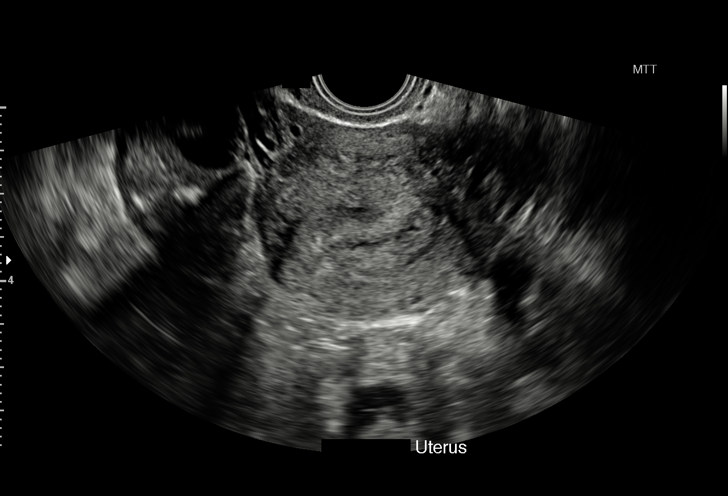
[im 64/115]
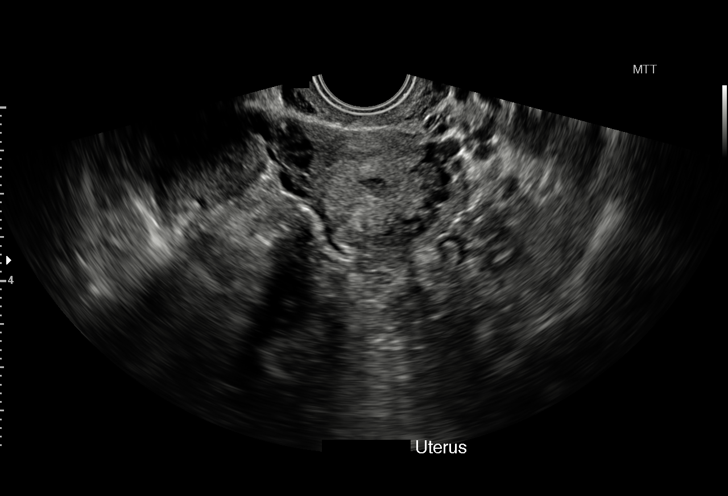
[im 72/115]
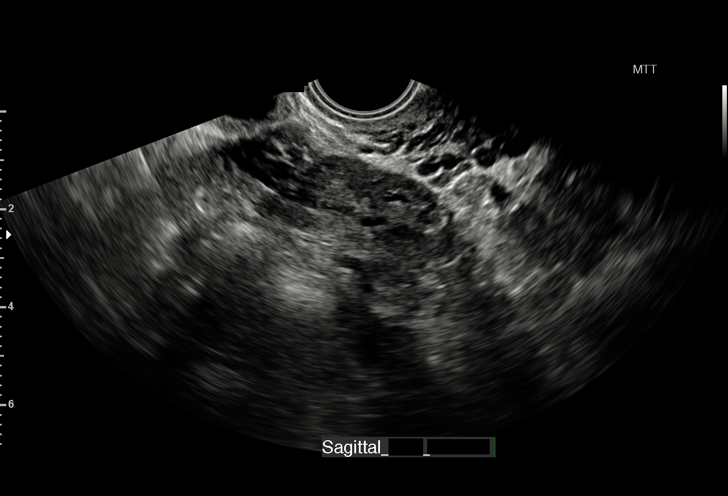
[im 81/115]
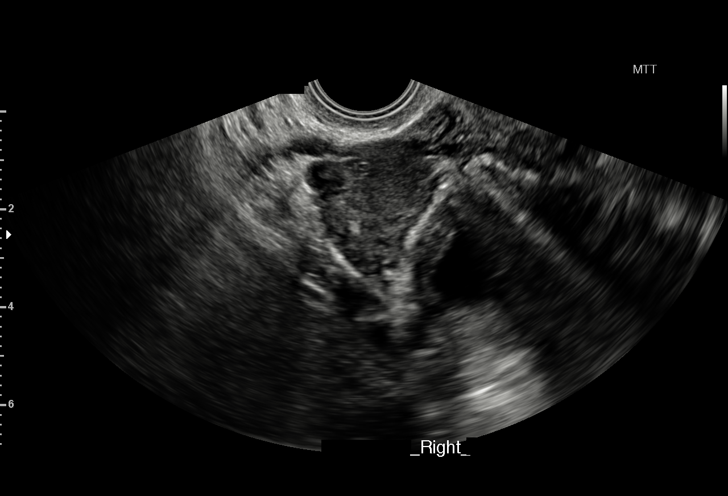
[im 89/115]
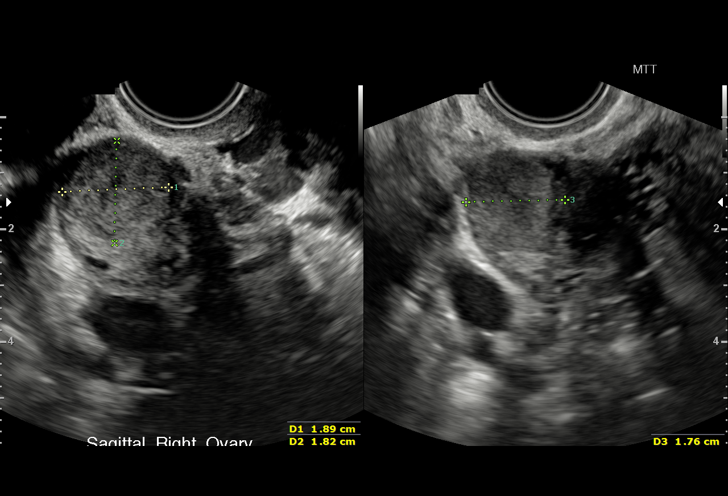
[im 98/115]
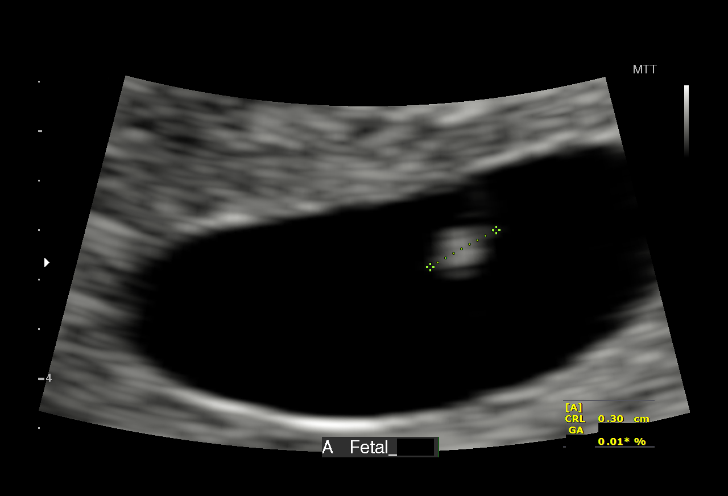
[im 106/115]
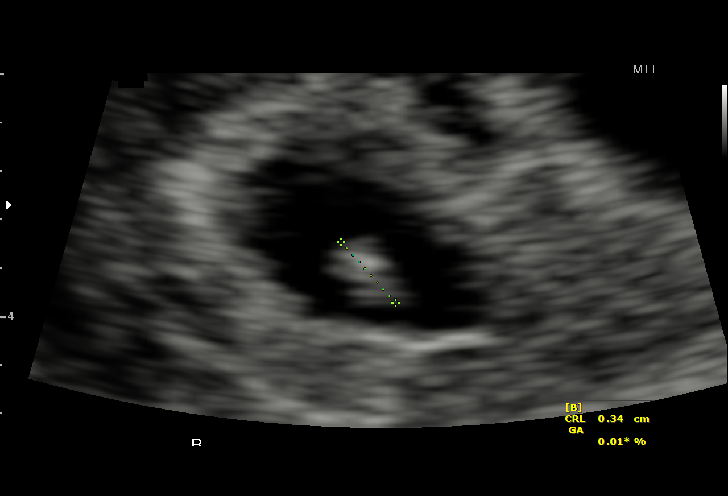
[im 115/115]
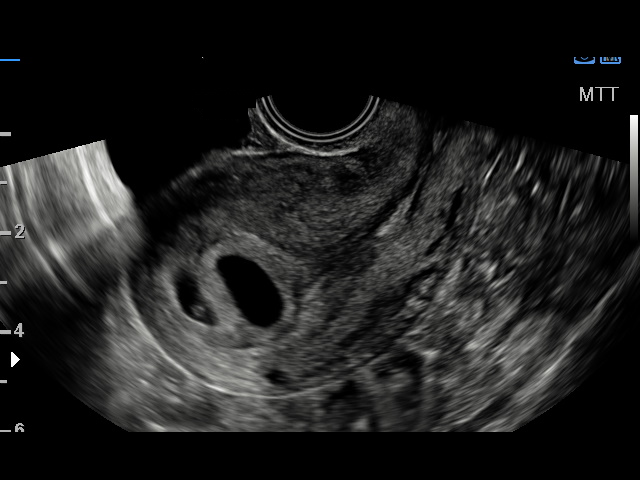

[15 of 28 positions shown; findings below may reference images not displayed]

FINDINGS: Twin intrauterine gestational is demonstrated. Two discrete
gestational sacs are present.

TWIN 1

Intrauterine gestational sac: Visualized/normal in shape.

Yolk sac:  Yolk sac is present.

Embryo:  Fetal pole is present.

Cardiac Activity: Fetal cardiac activity is observed.

Heart Rate: 100 bpm

CRL:  3.2  mm   5 w 6 d                  US EDC: 01/29/2016

TWIN 2

Intrauterine gestational sac: Visualized/normal in shape.

Yolk sac:  Yolk sac is present.

Embryo:  Fetal pole is present.

Cardiac Activity: Fetal cardiac activity is observed.

Heart Rate: 112 bpm

CRL:  3.1  mm   5 w 6 d                  US EDC: 01/29/2016

Maternal uterus/adnexae: Uterus is anteverted. No myometrial mass
lesions identified. Suggestion of a small subchorionic hemorrhage.
Both ovaries are visualized. Right ovary demonstrates cystic
structures likely representing two separate corpus luteal cysts. No
abnormal adnexal masses are seen. Minimal free fluid in the pelvis.
IMPRESSION: Twin intrauterine gestations. Fetal crown-rump length measurements
of both twin 1 and twin 2 are consistent with estimated gestational
age of 5 weeks 6 days. Small subchorionic hemorrhage is present.

## 2017-05-06 IMAGING — US US OB EACH ADDL GEST<[ID]
2 series · 15 of 28 positions shown · non-contrast
Comparison: None.

CLINICAL DATA: Spotting and cramping.  Twin gestations.

EXAM:
TWIN OBSTETRICAL ULTRASOUND <14 WKS

[Series 1: us ob each addl gest<(id) · 14 of 28 slices shown (1 of 2)]
[im 1/28]
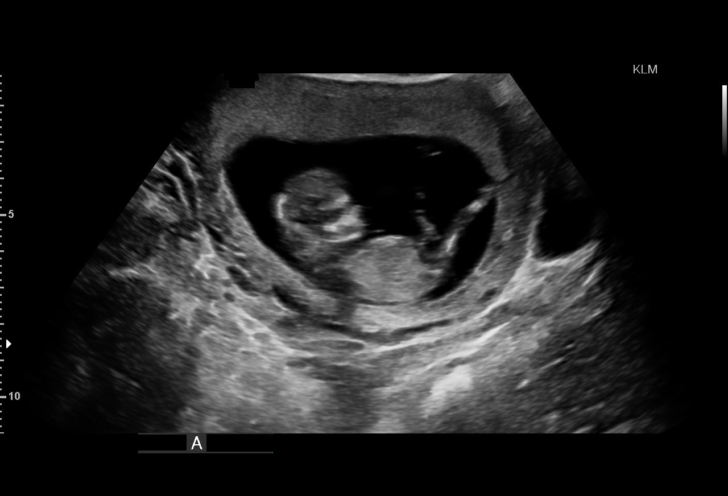
[im 3/28]
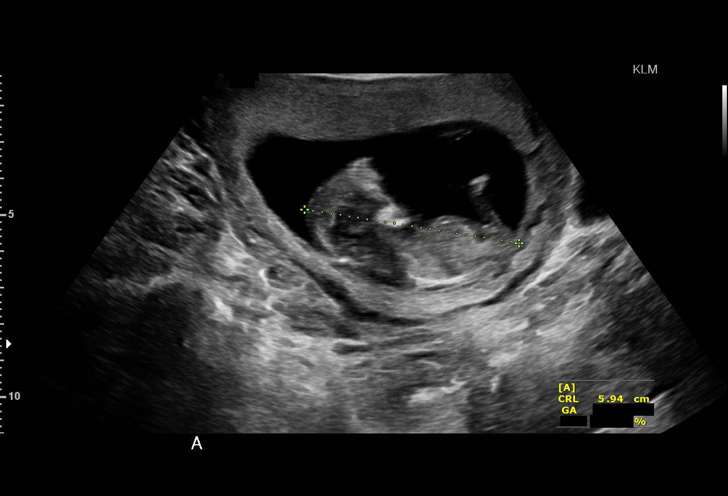
[im 5/28]
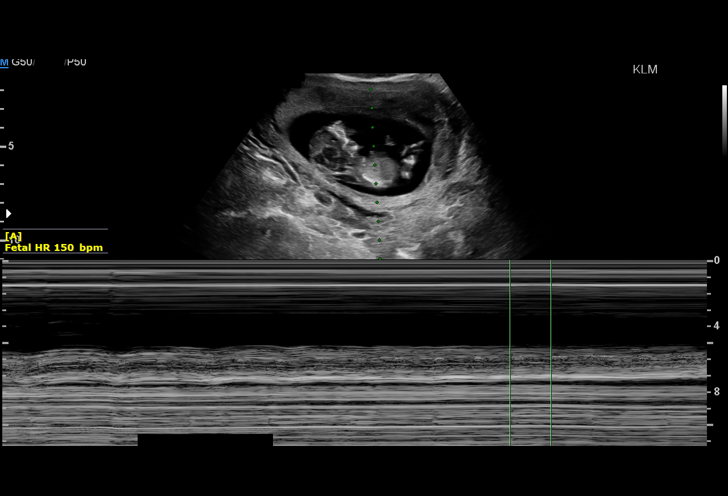
[im 7/28]
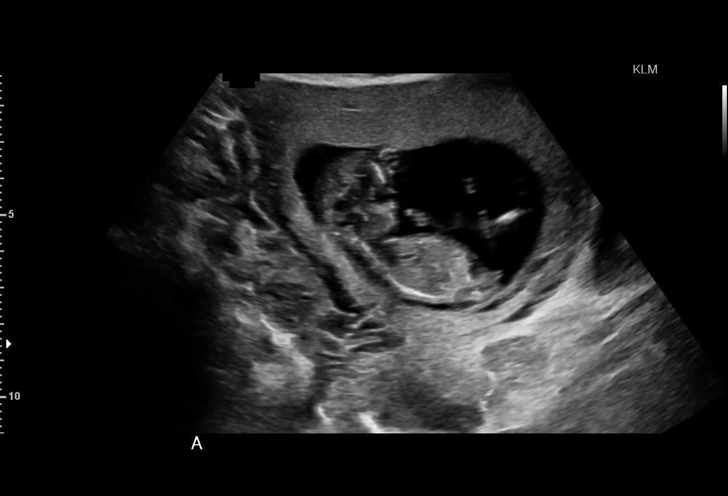
[im 9/28]
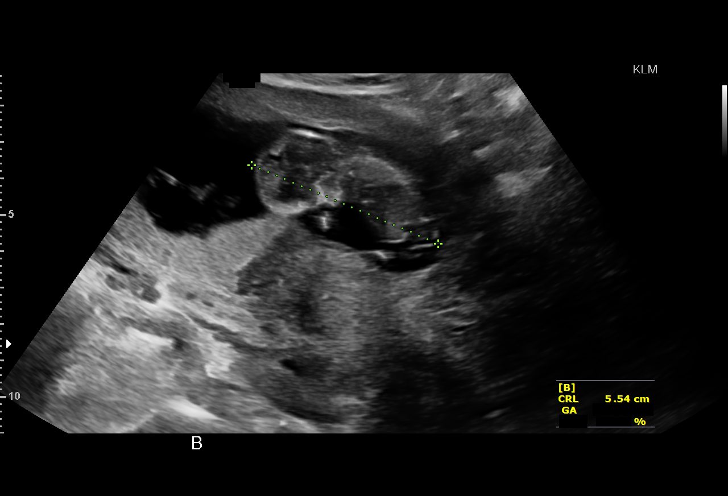
[im 11/28]
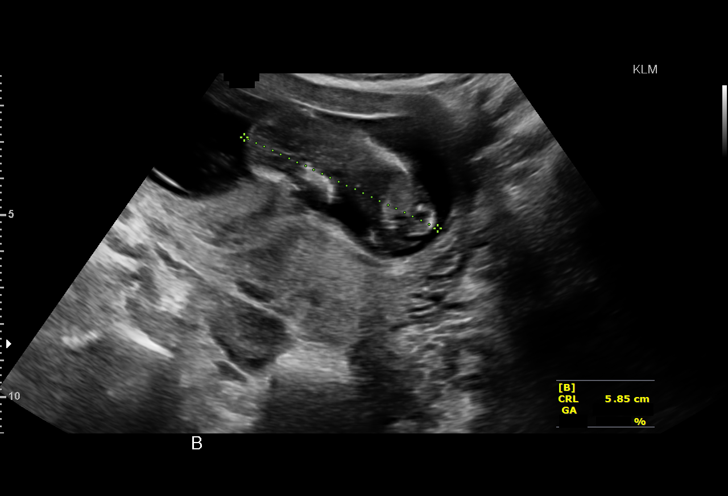
[im 13/28]
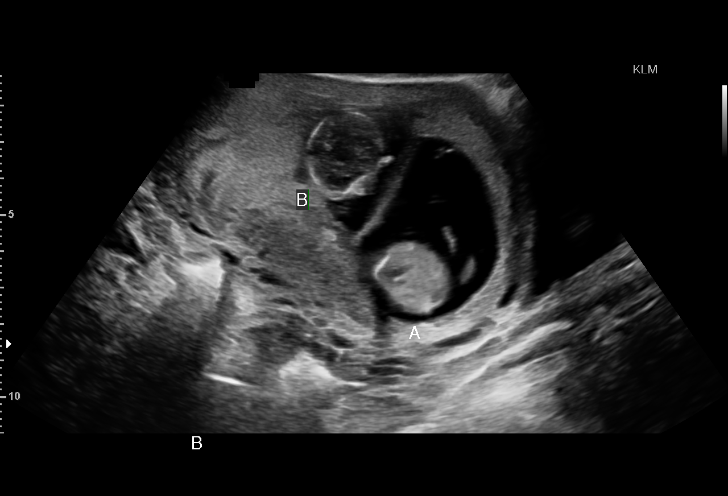
[im 15/28]
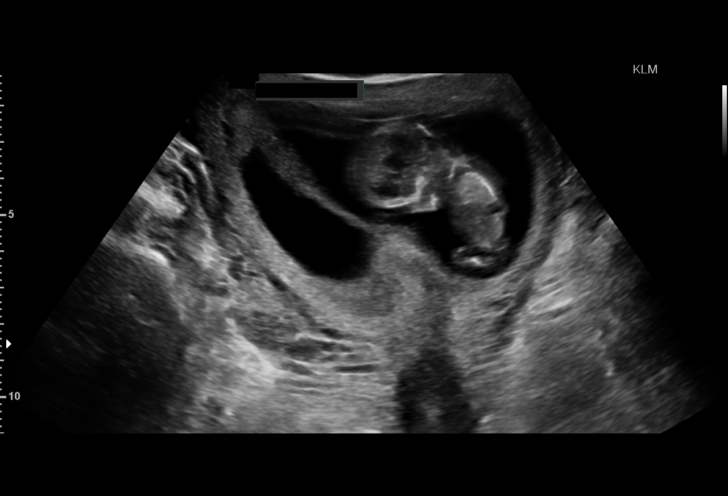
[im 16/28]
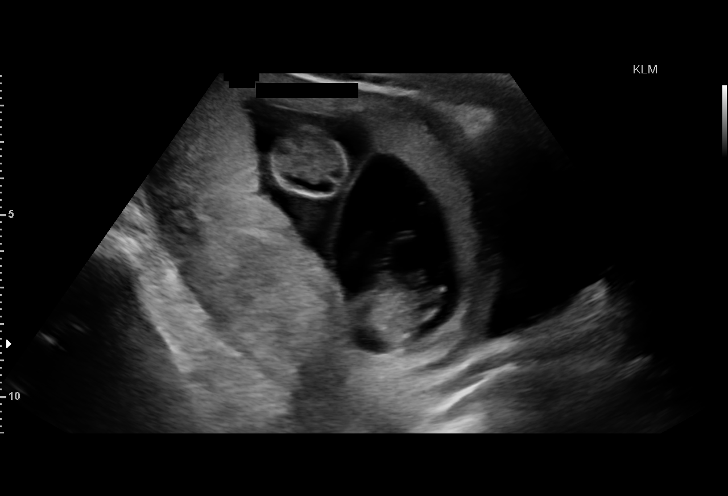
[im 18/28]
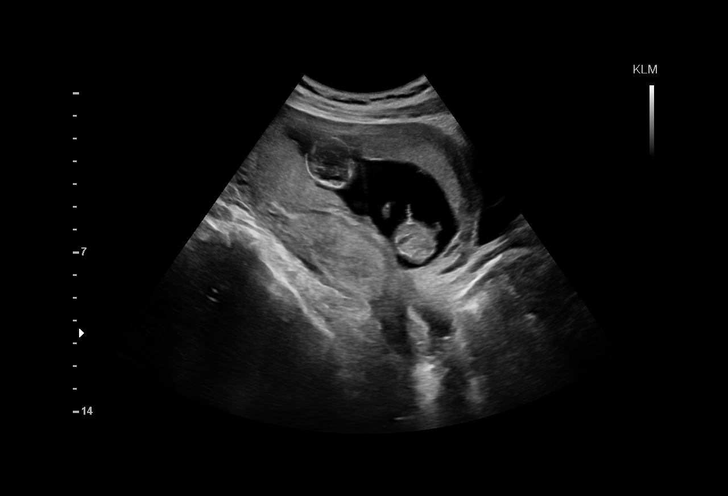
[im 20/28]
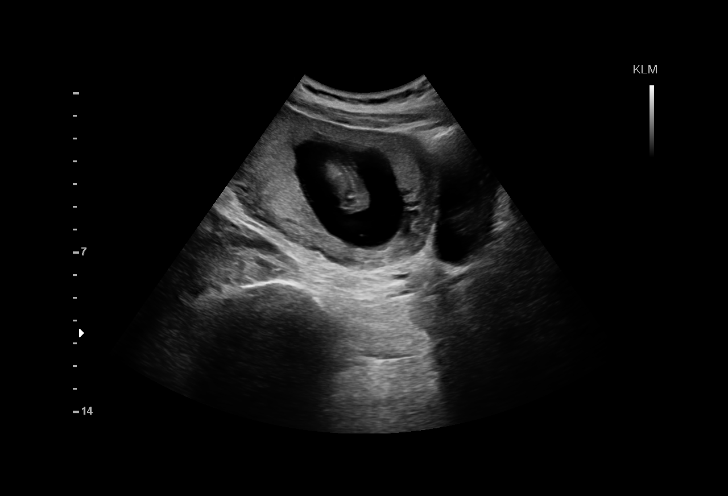
[im 22/28]
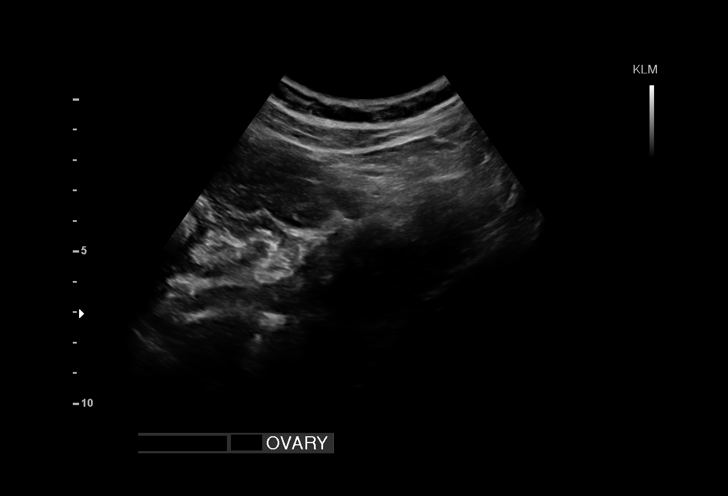
[im 24/28]
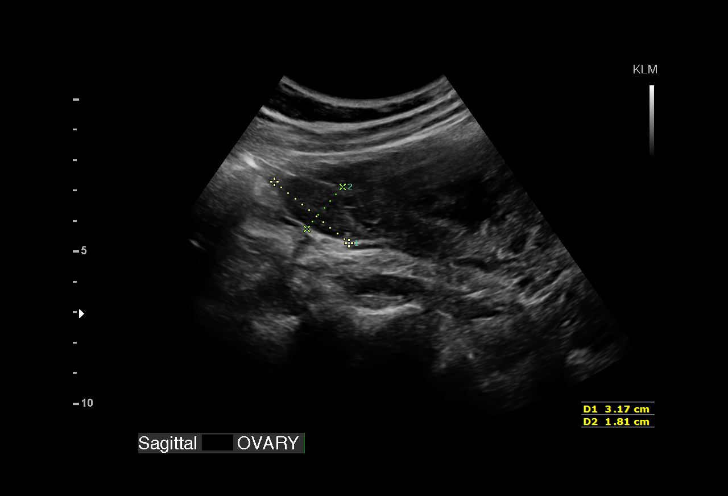
[im 26/28]
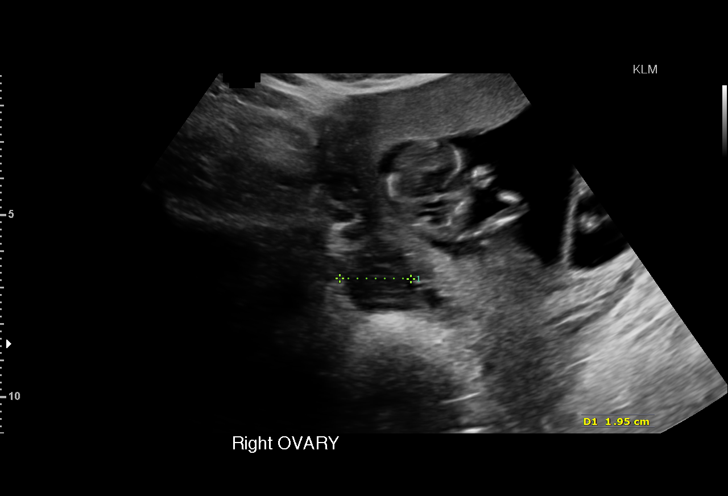

[Series 3: us ob each addl gest<(id) · 1 of 1 slices shown (2 of 2)]
[im 1/1]
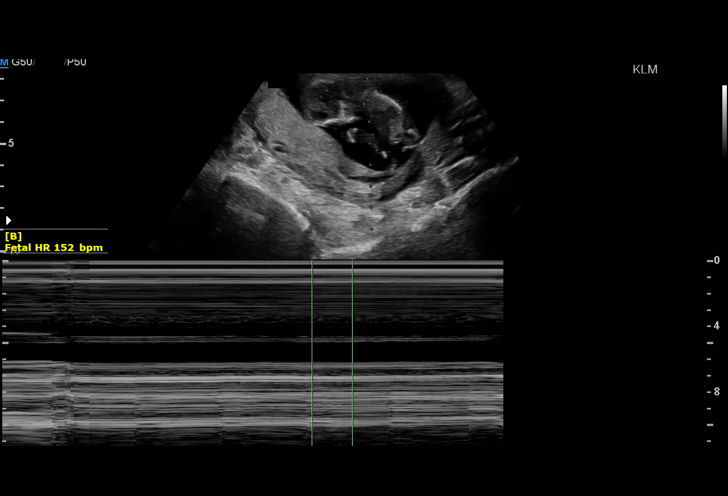

[15 of 28 positions shown; findings below may reference images not displayed]

FINDINGS: TWIN 1

Intrauterine gestational sac: Visualized/normal in shape.

Yolk sac:  No

Embryo:  Yes

Cardiac Activity: Yes

Heart Rate: 150 bpm

CRL:   59.9  mm   12 w 4d                  US EDC: 01/24/2016

TWIN 2

Intrauterine gestational sac: Visualized/normal in shape.

Yolk sac:  No

Embryo:  Yes

Cardiac Activity: Yes

Heart Rate: 152 bpm

CRL:   5.66  mm   12 w 2d                  US EDC: 01/24/2016

Maternal uterus/adnexae:

Subchorionic hemorrhage: None

Right ovary: Normal

Left ovary: Normal

Other :None

Free fluid:  None
IMPRESSION: 1. Living twin gestations.  No complications identified.

## 2017-11-30 ENCOUNTER — Encounter: Payer: Self-pay | Admitting: *Deleted

## 2018-07-22 ENCOUNTER — Encounter: Payer: Self-pay | Admitting: *Deleted

## 2021-02-21 ENCOUNTER — Encounter: Payer: Self-pay | Admitting: *Deleted

## 2021-07-29 ENCOUNTER — Encounter: Payer: Self-pay | Admitting: *Deleted

## 2023-05-07 DIAGNOSIS — F1910 Other psychoactive substance abuse, uncomplicated: Secondary | ICD-10-CM | POA: Diagnosis not present

## 2024-06-07 ENCOUNTER — Emergency Department (HOSPITAL_COMMUNITY)
Admission: EM | Admit: 2024-06-07 | Discharge: 2024-06-07 | Discharge status: Against Medical Advice | Attending: Emergency Medicine | Admitting: Emergency Medicine

## 2024-06-07 ENCOUNTER — Other Ambulatory Visit: Payer: Self-pay

## 2024-06-07 DIAGNOSIS — N6320 Unspecified lump in the left breast, unspecified quadrant: Secondary | ICD-10-CM | POA: Insufficient documentation

## 2024-06-07 DIAGNOSIS — Z5321 Procedure and treatment not carried out due to patient leaving prior to being seen by health care provider: Secondary | ICD-10-CM | POA: Diagnosis not present

## 2024-06-07 DIAGNOSIS — N644 Mastodynia: Secondary | ICD-10-CM | POA: Diagnosis present

## 2024-06-07 NOTE — ED Triage Notes (Signed)
 Patient reports left breast swelling with pain and lump for several weeks , denies injury or drainage . No fever or chills .

## 2024-06-07 NOTE — ED Notes (Signed)
 Pt left AMA due to overly crowded lobby and long ED wait times.

## 2024-06-07 NOTE — ED Notes (Signed)
 This phlebotomist stuck the patient twice unable to collect labs

## 2024-06-15 ENCOUNTER — Emergency Department (HOSPITAL_BASED_OUTPATIENT_CLINIC_OR_DEPARTMENT_OTHER)
Admission: EM | Admit: 2024-06-15 | Discharge: 2024-06-15 | Discharge status: Against Medical Advice | Attending: Emergency Medicine | Admitting: Emergency Medicine

## 2024-06-15 ENCOUNTER — Emergency Department (HOSPITAL_BASED_OUTPATIENT_CLINIC_OR_DEPARTMENT_OTHER)

## 2024-06-15 DIAGNOSIS — D649 Anemia, unspecified: Secondary | ICD-10-CM | POA: Insufficient documentation

## 2024-06-15 DIAGNOSIS — Z5329 Procedure and treatment not carried out because of patient's decision for other reasons: Secondary | ICD-10-CM | POA: Diagnosis not present

## 2024-06-15 DIAGNOSIS — R222 Localized swelling, mass and lump, trunk: Secondary | ICD-10-CM | POA: Insufficient documentation

## 2024-06-15 LAB — CBC WITH DIFFERENTIAL/PLATELET
Abs Immature Granulocytes: 0.02 K/uL (ref 0.00–0.07)
Basophils Absolute: 0 K/uL (ref 0.0–0.1)
Basophils Relative: 0 %
Eosinophils Absolute: 0 K/uL (ref 0.0–0.5)
Eosinophils Relative: 1 %
HCT: 31.5 % — ABNORMAL LOW (ref 36.0–46.0)
Hemoglobin: 9.7 g/dL — ABNORMAL LOW (ref 12.0–15.0)
Immature Granulocytes: 0 %
Lymphocytes Relative: 28 %
Lymphs Abs: 1.9 K/uL (ref 0.7–4.0)
MCH: 23.4 pg — ABNORMAL LOW (ref 26.0–34.0)
MCHC: 30.8 g/dL (ref 30.0–36.0)
MCV: 75.9 fL — ABNORMAL LOW (ref 80.0–100.0)
Monocytes Absolute: 0.4 K/uL (ref 0.1–1.0)
Monocytes Relative: 5 %
Neutro Abs: 4.6 K/uL (ref 1.7–7.7)
Neutrophils Relative %: 66 %
Platelets: 315 K/uL (ref 150–400)
RBC: 4.15 MIL/uL (ref 3.87–5.11)
RDW: 18.8 % — ABNORMAL HIGH (ref 11.5–15.5)
WBC: 7 K/uL (ref 4.0–10.5)
nRBC: 0 % (ref 0.0–0.2)

## 2024-06-15 LAB — BASIC METABOLIC PANEL WITH GFR
Anion gap: 11 (ref 5–15)
BUN: 10 mg/dL (ref 6–20)
CO2: 25 mmol/L (ref 22–32)
Calcium: 9.7 mg/dL (ref 8.9–10.3)
Chloride: 102 mmol/L (ref 98–111)
Creatinine, Ser: 0.52 mg/dL (ref 0.44–1.00)
GFR, Estimated: >60 mL/min (ref >60)
Glucose, Bld: 100 mg/dL — ABNORMAL HIGH (ref 70–99)
Potassium: 4.1 mmol/L (ref 3.5–5.1)
Sodium: 138 mmol/L (ref 135–145)

## 2024-06-15 MED ORDER — ACETAMINOPHEN 500 MG PO TABS
1000.0000 mg | ORAL_TABLET | Freq: Once | ORAL | Status: AC
  Administered 2024-06-15: 1000 mg via ORAL
  Filled 2024-06-15: qty 2

## 2024-06-15 NOTE — ED Provider Notes (Signed)
 [place] AT [facility] Provider Note   CSN: [id number] Arrival date & time: 06/15/24  1238     Patient presents with: Breast Mass   April [name] is a 33 y.o. female who presents with concern for a bump on her left upper chest underneath her left breast.  States this has been there for the past 5 months.  She states it does not seem to be changing in size.  Reports there is some pain to the area.  Denies any skin changes to the chest or breast.  Denies any nipple discharge.  Denies any injuries to this area.   Patient with history of IV drug use, previous MRSA bacteremia and epidural abscess, osteomyelitis    HPI     Prior to Admission medications   Medication Sig Start Date End Date Taking? Authorizing Provider  acetaminophen  (TYLENOL ) 500 MG tablet Take 1 tablet (500 mg total) by mouth every 6 (six) hours as needed. 11/11/16   Dansie, William, PA-C  ferrous sulfate  325 (65 FE) MG tablet Take 1 tablet (325 mg total) by mouth 2 (two) times daily with a meal. Patient not taking: Reported on 04/07/2016 02/12/16   Desiderio Chew, PA-C  ibuprofen  (ADVIL ,MOTRIN ) 200 MG tablet Take 200 mg by mouth every 4 (four) hours as needed for fever, headache, mild pain, moderate pain or cramping.    [provider]  meclizine  (ANTIVERT ) 25 MG tablet Take 1 tablet (25 mg total) by mouth 3 (three) times daily as needed for dizziness. 11/11/16   Dansie, William, PA-C  ondansetron  (ZOFRAN -ODT) 4 MG disintegrating tablet Take 4 mg by mouth every 8 (eight) hours as needed for nausea or vomiting.    [provider]  Prenatal Vit-Fe Fumarate-FA (PRENATAL VITAMINS) 28-0.8 MG TABS Take 1 tablet by mouth as directed. 11/11/16   Dansie, William, PA-C  hydrALAZINE  (APRESOLINE ) 50 MG tablet Take 1 tablet (50 mg total) by mouth every 8 (eight) hours. Patient not taking: Reported on 02/12/2016 01/13/16 02/12/16  Elgergawy, Brayton RAMAN, MD    Allergies: Nsaids, Tamiflu   [oseltamivir  phosphate], and Codeine     Review of Systems  Skin:  Negative for color change and wound.    Updated Vital Signs BP 105/64 (BP Location: Right Arm)   Pulse (!) 107   Temp 98.2 F (36.8 C) (Oral)   Resp 18   SpO2 98%   Physical Exam Vitals and nursing note reviewed.  Constitutional:      Appearance: Normal appearance.  HENT:     Head: Atraumatic.  Cardiovascular:     Rate and Rhythm: Normal rate and regular rhythm.  Pulmonary:     Effort: Pulmonary effort is normal.  Chest:       Comments: Breast exam performed with RN chaperone present  Lump appreciated overlying the left anterior rib under the left breast, approximately 3 cm in diameter.  No overlying skin change.  The area feels soft, but not fluctuant.  No drainage from the area.  Small nodule appreciated at the 7 o'clock position on the right breast.  No drainage from this area and no overlying skin change.  No other breast masses noted on palpation of the right or left breast.  No inverted nipple or skin dimpling.  No nipple discharge.   Abdominal:     Palpations: Abdomen is soft.     Tenderness: There is no abdominal tenderness.  Neurological:     General: No focal deficit present.     Mental Status:  She is alert.  Psychiatric:        Mood and Affect: Mood normal.        Behavior: Behavior normal.     (all labs ordered are listed, but only abnormal results are displayed) Labs Reviewed  CBC WITH DIFFERENTIAL/PLATELET - Abnormal; Notable for the following components:      Result Value   Hemoglobin 9.7 (*)    HCT 31.5 (*)    MCV 75.9 (*)    MCH 23.4 (*)    RDW 18.8 (*)    All other components within normal limits  BASIC METABOLIC PANEL WITH GFR - Abnormal; Notable for the following components:   Glucose, Bld 100 (*)    All other components within normal limits    EKG: None  Radiology: US  CHEST SOFT TISSUE Result Date: 06/15/2024 CLINICAL DATA:  Left breast mass. EXAM: ULTRASOUND OF  CHEST SOFT TISSUES TECHNIQUE: Ultrasound examination of the chest wall soft tissues was performed in the area of clinical concern. COMPARISON:  None Available. FINDINGS: Limited and targeted sonographic images of the soft tissues of the left breast for evaluation of possible abscess. There is a 2.7 x 0.5 x 2.3 cm hypoechoic area in the region of the concern likely represents a small loculated collection. No internal vascularity or hyperemia in the surrounding tissue on color images. This may represent an inflammatory/infectious process. Other etiologies including malignancy are not excluded and not evaluated by this ultrasound. Correlation with clinical exam and dedicated mammographic studies, as clinically indicated, recommended. Follow-up after treatment is advised to document resolution. IMPRESSION: Small loculated collection. Clinical correlation and follow-up recommended. Please note this study is not a diagnostic ultrasound for possible underlying breast pathology. Electronically Signed   By: Vanetta Chou M.D.   On: 06/15/2024 15:50     Procedures   Medications Ordered in the ED  acetaminophen  (TYLENOL ) tablet 1,000 mg (1,000 mg Oral Given 06/15/24 1407)                                    Medical Decision Making Amount and/or Complexity of Data Reviewed Labs: ordered. Radiology: ordered.  Risk OTC drugs.     Differential diagnosis includes but is not limited to lipoma, breast mass, malignancy, abscess, cellulitis   ED Course:  Upon initial evaluation, patient is well-appearing, no acute distress.  Stable vitals.  She has a lump over the left chest wall underneath the left breast.  This seems to be overlying a rib.  The area is soft, but is not fluctuant.  No overlying skin wounds, erythema, or other color change.  No drainage from the area.  This does not appear infectious such as an abscess or cellulitis.  Patient does have a small nodule on the right breast, this does not have  any overlying skin change, no drainage from the area.  No other breast masses noted.  No skin change or nipple discharge of the breasts bilaterally. To begin workup, ultrasound was performed for initial evaluation.  This showed a small loculated collection that was of unclear etiology.  I discussed with patient this finding and that I would recommend a CT of the chest to further evaluate this area during her ER visit.  She declined any further evaluation in the ER, stating she wanted to go home. I discussed that this area could represent a possible malignancy, infection, or other etiology that would need treatment.  She  verbalized understanding and still states she wants to leave without any further imaging at this time.  I discussed the risks of leaving AMA with the patient including possible worsening of symptoms, disability, death.  Patient is not confused and does not appear intoxicated. Patient has decision making capacity. Patient verbalized understanding of these risks and wishes to leave.   Patient also was found to have a low hemoglobin on her CBC at 9.7.  She states she has heavy menstrual cycles, this could cause the low hemoglobin seen today.  No recent hemoglobin to compare to.  She denies any melanotic stools.  Lower concern for GI bleed.  She denies any recent traumas.  Hemoglobin not below 7, no concern for active bleed, does not need any blood transfusion at this time.  I discussed this finding with the patient.  She understands this is a very low hemoglobin and she needs to follow-up with her PCP within the next week for recheck of this value and for further management.   Labs Ordered: I Ordered, and personally interpreted labs.  The pertinent results include:   CBC without leukocytosis.  Hemoglobin 9.7 BMP within normal limits  Imaging Studies ordered: I ordered imaging studies including ultrasound chest I independently visualized the imaging with scope of interpretation limited to  determining acute life threatening conditions related to emergency care. Imaging showed  TECHNIQUE:  Ultrasound examination of the chest wall soft tissues was performed  in the area of clinical concern.    COMPARISON:  None Available.    FINDINGS:  Limited and targeted sonographic images of the soft tissues of the  left breast for evaluation of possible abscess.    There is a 2.7 x 0.5 x 2.3 cm hypoechoic area in the region of the  concern likely represents a small loculated collection. No internal  vascularity or hyperemia in the surrounding tissue on color images.    This may represent an inflammatory/infectious process. Other  etiologies including malignancy are not excluded and not evaluated  by this ultrasound. Correlation with clinical exam and dedicated  mammographic studies, as clinically indicated, recommended.  Follow-up after treatment is advised to document resolution.    IMPRESSION:  Small loculated collection. Clinical correlation and follow-up  recommended.    Please note this study is not a diagnostic ultrasound for possible  underlying breast pathology.   I agree with the radiologist interpretation   Medications Given: None  Impression: Mass on chest wall  Disposition:  Discharged AMA - Pt was competent to make medical decisions and patient was thoroughly explained alternative treatments and risks and understands these.  She was encouraged to follow-up with her PCP or return to the ER for further evaluation at any time.  She understands that the lump on her chest wall and the nodule on her right breast may need further workup with mammography and likely chest CT.  She also understands that the hemoglobin today was low when she was to have this repeated and followed by her PCP within the next week. Return precautions given.   This chart was dictated using voice recognition software, Dragon. Despite the best efforts of this provider to proofread and correct  errors, errors may still occur which can change documentation meaning.       Final diagnoses:  Chest wall mass  Low hemoglobin    ED Discharge Orders     None          Veta Palma, [place] 06/15/24 1716    Tegeler,  Lonni PARAS, MD 06/15/24 2148

## 2024-06-15 NOTE — ED Triage Notes (Signed)
 Reports swelling to left breast x 2 months. Denies drainage and fevers.

## 2024-06-15 NOTE — ED Notes (Signed)
 Left without receiving DC papers or education.

## 2024-06-15 NOTE — Discharge Instructions (Addendum)
 Your ultrasound showed a mass in the area of concern, but it is unclear what this is at this time.  I recommended a CT scan in the emergency room today for further evaluation, but you have declined.  I have included your ultrasound results below for your reference.  Please return to the ER at anytime for further evaluation  On your blood work, your hemoglobin which is one of your blood counts is low.  This could be due to heavy menstrual cycles, iron deficiency, or other causes.  Please follow-up with your PCP within the next week for repeat labs and further evaluation. Please have your PCP send a referral for you to the breast center for a mammogram of the nodule on your right breast within the next week.    If you become very weak, dizzy, short of breath, this could be a sign of your hemoglobin dropping even further.  Please return to the ER if you develop the symptoms.  Please return to the ER for any new or worsening symptoms, fevers, chills, worsening pain, any other new or concerning symptoms   EXAM:  ULTRASOUND OF CHEST SOFT TISSUES    TECHNIQUE:  Ultrasound examination of the chest wall soft tissues was performed  in the area of clinical concern.    COMPARISON:  None Available.    FINDINGS:  Limited and targeted sonographic images of the soft tissues of the  left breast for evaluation of possible abscess.    There is a 2.7 x 0.5 x 2.3 cm hypoechoic area in the region of the  concern likely represents a small loculated collection. No internal  vascularity or hyperemia in the surrounding tissue on color images.    This may represent an inflammatory/infectious process. Other  etiologies including malignancy are not excluded and not evaluated  by this ultrasound. Correlation with clinical exam and dedicated  mammographic studies, as clinically indicated, recommended.  Follow-up after treatment is advised to document resolution.    IMPRESSION:  Small loculated collection.  Clinical correlation and follow-up  recommended.    Please note this study is not a diagnostic ultrasound for possible  underlying breast pathology.      Electronically Signed    By: Vanetta Chou M.D.    On: 06/15/2024 15:50

## 2024-06-15 NOTE — ED Notes (Signed)
 Pt left without discharge papers.

## 2024-07-04 ENCOUNTER — Other Ambulatory Visit: Payer: Self-pay

## 2024-07-04 ENCOUNTER — Encounter (HOSPITAL_COMMUNITY): Payer: Self-pay

## 2024-07-04 ENCOUNTER — Emergency Department (HOSPITAL_COMMUNITY)

## 2024-07-04 ENCOUNTER — Inpatient Hospital Stay (HOSPITAL_COMMUNITY)
Admission: EM | Admit: 2024-07-04 | Discharge: 2024-07-11 | DRG: 600 | Disposition: A | Discharge status: Home / Self Care | Attending: Internal Medicine | Admitting: Internal Medicine

## 2024-07-04 DIAGNOSIS — N6320 Unspecified lump in the left breast, unspecified quadrant: Principal | ICD-10-CM | POA: Diagnosis present

## 2024-07-04 DIAGNOSIS — L02213 Cutaneous abscess of chest wall: Secondary | ICD-10-CM | POA: Diagnosis present

## 2024-07-04 DIAGNOSIS — F32A Depression, unspecified: Secondary | ICD-10-CM | POA: Diagnosis present

## 2024-07-04 DIAGNOSIS — M7989 Other specified soft tissue disorders: Secondary | ICD-10-CM | POA: Diagnosis present

## 2024-07-04 DIAGNOSIS — Z886 Allergy status to analgesic agent status: Secondary | ICD-10-CM

## 2024-07-04 DIAGNOSIS — Z885 Allergy status to narcotic agent status: Secondary | ICD-10-CM

## 2024-07-04 DIAGNOSIS — Z888 Allergy status to other drugs, medicaments and biological substances status: Secondary | ICD-10-CM

## 2024-07-04 DIAGNOSIS — Z818 Family history of other mental and behavioral disorders: Secondary | ICD-10-CM

## 2024-07-04 DIAGNOSIS — D509 Iron deficiency anemia, unspecified: Secondary | ICD-10-CM | POA: Diagnosis present

## 2024-07-04 DIAGNOSIS — F1191 Opioid use, unspecified, in remission: Secondary | ICD-10-CM | POA: Diagnosis not present

## 2024-07-04 DIAGNOSIS — M19021 Primary osteoarthritis, right elbow: Secondary | ICD-10-CM | POA: Diagnosis present

## 2024-07-04 DIAGNOSIS — L03313 Cellulitis of chest wall: Secondary | ICD-10-CM | POA: Diagnosis present

## 2024-07-04 DIAGNOSIS — Z8614 Personal history of Methicillin resistant Staphylococcus aureus infection: Secondary | ICD-10-CM | POA: Diagnosis not present

## 2024-07-04 DIAGNOSIS — R222 Localized swelling, mass and lump, trunk: Secondary | ICD-10-CM | POA: Diagnosis present

## 2024-07-04 DIAGNOSIS — Z8249 Family history of ischemic heart disease and other diseases of the circulatory system: Secondary | ICD-10-CM

## 2024-07-04 DIAGNOSIS — F17210 Nicotine dependence, cigarettes, uncomplicated: Secondary | ICD-10-CM | POA: Diagnosis present

## 2024-07-04 LAB — BASIC METABOLIC PANEL WITH GFR
Anion gap: 14 (ref 5–15)
BUN: 9 mg/dL (ref 6–20)
CO2: 22 mmol/L (ref 22–32)
Calcium: 9.7 mg/dL (ref 8.9–10.3)
Chloride: 100 mmol/L (ref 98–111)
Creatinine, Ser: 0.56 mg/dL (ref 0.44–1.00)
GFR, Estimated: >60 mL/min (ref >60)
Glucose, Bld: 76 mg/dL (ref 70–99)
Potassium: 3.8 mmol/L (ref 3.5–5.1)
Sodium: 136 mmol/L (ref 135–145)

## 2024-07-04 LAB — CBC
HCT: 33.2 % — ABNORMAL LOW (ref 36.0–46.0)
Hemoglobin: 9.7 g/dL — ABNORMAL LOW (ref 12.0–15.0)
MCH: 23.2 pg — ABNORMAL LOW (ref 26.0–34.0)
MCHC: 29.2 g/dL — ABNORMAL LOW (ref 30.0–36.0)
MCV: 79.2 fL — ABNORMAL LOW (ref 80.0–100.0)
Platelets: 321 K/uL (ref 150–400)
RBC: 4.19 MIL/uL (ref 3.87–5.11)
RDW: 17.6 % — ABNORMAL HIGH (ref 11.5–15.5)
WBC: 5.6 K/uL (ref 4.0–10.5)
nRBC: 0 % (ref 0.0–0.2)

## 2024-07-04 LAB — HEPATIC FUNCTION PANEL
ALT: 26 U/L (ref 0–44)
AST: 34 U/L (ref 15–41)
Albumin: 4.3 g/dL (ref 3.5–5.0)
Alkaline Phosphatase: 137 U/L — ABNORMAL HIGH (ref 38–126)
Bilirubin, Direct: <0.1 mg/dL (ref 0.0–0.2)
Total Bilirubin: 0.3 mg/dL (ref 0.0–1.2)
Total Protein: 7.9 g/dL (ref 6.5–8.1)

## 2024-07-04 LAB — HCG, SERUM, QUALITATIVE: Preg, Serum: NEGATIVE

## 2024-07-04 MED ORDER — HYDROMORPHONE HCL 1 MG/ML IJ SOLN
0.5000 mg | Freq: Once | INTRAMUSCULAR | Status: AC
  Administered 2024-07-04: 0.5 mg via INTRAVENOUS
  Filled 2024-07-04: qty 1

## 2024-07-04 MED ORDER — SODIUM CHLORIDE 0.9 % IV SOLN
2.0000 g | Freq: Once | INTRAVENOUS | Status: AC
  Administered 2024-07-04: 2 g via INTRAVENOUS
  Filled 2024-07-04: qty 12.5

## 2024-07-04 MED ORDER — BISACODYL 5 MG PO TBEC: 5.0000 mg | DELAYED_RELEASE_TABLET | Freq: Every day | ORAL | Status: DC | PRN

## 2024-07-04 MED ORDER — ONDANSETRON HCL 4 MG/2ML IJ SOLN
4.0000 mg | Freq: Once | INTRAMUSCULAR | Status: AC
  Administered 2024-07-04: 4 mg via INTRAVENOUS
  Filled 2024-07-04: qty 2

## 2024-07-04 MED ORDER — OXYCODONE-ACETAMINOPHEN 5-325 MG PO TABS
1.0000 | ORAL_TABLET | Freq: Four times a day (QID) | ORAL | Status: DC | PRN
  Administered 2024-07-04 – 2024-07-05 (×2): 1 via ORAL
  Filled 2024-07-04 (×2): qty 1

## 2024-07-04 MED ORDER — KETOROLAC TROMETHAMINE 15 MG/ML IJ SOLN
15.0000 mg | Freq: Once | INTRAMUSCULAR | Status: AC
  Administered 2024-07-04: 15 mg via INTRAVENOUS
  Filled 2024-07-04: qty 1

## 2024-07-04 MED ORDER — ENOXAPARIN SODIUM 40 MG/0.4ML IJ SOSY
40.0000 mg | PREFILLED_SYRINGE | INTRAMUSCULAR | Status: DC
  Administered 2024-07-05: 40 mg via SUBCUTANEOUS
  Filled 2024-07-04: qty 0.4

## 2024-07-04 MED ORDER — IOHEXOL 300 MG/ML  SOLN
100.0000 mL | Freq: Once | INTRAMUSCULAR | Status: AC | PRN
  Administered 2024-07-04: 100 mL via INTRAVENOUS

## 2024-07-04 MED ORDER — ONDANSETRON HCL 4 MG/2ML IJ SOLN
4.0000 mg | Freq: Four times a day (QID) | INTRAMUSCULAR | Status: DC | PRN
  Administered 2024-07-06 – 2024-07-09 (×6): 4 mg via INTRAVENOUS
  Filled 2024-07-04 (×6): qty 2

## 2024-07-04 MED ORDER — SENNOSIDES-DOCUSATE SODIUM 8.6-50 MG PO TABS: 1.0000 | ORAL_TABLET | Freq: Every evening | ORAL | Status: DC | PRN

## 2024-07-04 MED ORDER — DIPHENHYDRAMINE HCL 25 MG PO CAPS
25.0000 mg | ORAL_CAPSULE | Freq: Four times a day (QID) | ORAL | Status: DC | PRN
Start: 2024-07-04 — End: 2024-07-11
  Administered 2024-07-04: 25 mg via ORAL
  Filled 2024-07-04: qty 1

## 2024-07-04 MED ORDER — ACETAMINOPHEN 325 MG PO TABS
650.0000 mg | ORAL_TABLET | Freq: Four times a day (QID) | ORAL | Status: DC | PRN
  Administered 2024-07-05: 650 mg via ORAL
  Filled 2024-07-04: qty 2

## 2024-07-04 MED ORDER — ACETAMINOPHEN 650 MG RE SUPP: 650.0000 mg | Freq: Four times a day (QID) | RECTAL | Status: DC | PRN

## 2024-07-04 MED ORDER — ONDANSETRON HCL 4 MG PO TABS
4.0000 mg | ORAL_TABLET | Freq: Four times a day (QID) | ORAL | Status: DC | PRN
  Administered 2024-07-10: 4 mg via ORAL
  Filled 2024-07-04: qty 1

## 2024-07-04 MED ORDER — VANCOMYCIN HCL IN DEXTROSE 1-5 GM/200ML-% IV SOLN
1000.0000 mg | Freq: Once | INTRAVENOUS | Status: AC
  Administered 2024-07-04: 1000 mg via INTRAVENOUS
  Filled 2024-07-04: qty 200

## 2024-07-04 NOTE — H&P (Signed)
 History and Physical  YISELL [name] FMW:[id number] DOB: [birth year] DOA: 07/04/2024  PCP: [name]   Chief Complaint: Painful left breast mass  HPI: April [name] is a 33 y.o. 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  ED Course: Initial vitals show patient afebrile and normotensive.  Initial labs shows Hgb 9.7 otherwise normal renal function, LFTs and negative pregnancy test.  CT chest/abdomen/pelvis shows a 6.4 x 3.7 cm area of inflammation underneath the left 6th, 7th and 8th ribs with small areas of fluid within and another area of fluid measuring 2.3 x 1.2 cm.  Pt received IV Dilaudid , IV Toradol  IV Zofran , IV vancomycin  and IV cefepime.  Oncology was consulted for evaluation. [facility] was consulted for admission.   Review of Systems: Please see HPI for pertinent positives and negatives. A complete 10 system review of systems are otherwise negative.  Past Medical History:  Diagnosis Date   Depression    GERD (gastroesophageal reflux disease)    Herpes    Infection    UTI   UTI (urinary tract infection) in pregnancy in first trimester    Past Surgical History:  Procedure Laterality Date   CESAREAN SECTION MULTI-GESTATIONAL  12/31/2015   Procedure: CESAREAN SECTION MULTI-GESTATIONAL;  Surgeon: Winton Felt, MD;  Location: [facility];  Service: Obstetrics;;   DILATION AND CURETTAGE OF UTERUS     Social History:  reports that she has been smoking cigarettes. She started smoking about 17 years ago. She has a 4.3 pack-year smoking history. She has never used smokeless tobacco. She reports that she does not drink alcohol and does not use drugs.  Allergies  Allergen Reactions   Nsaids Other (See Comments)    Abdominal pain    Tamiflu  [Oseltamivir  Phosphate] Hives, Itching and Nausea And  Vomiting   Codeine  Nausea And Vomiting    Specifically tylenol  3    Family History  Problem Relation Age of Onset   Hypertension Maternal Grandmother    Depression Maternal Grandmother    Heart disease Maternal Grandmother    Cancer Maternal Grandfather        lung     Prior to Admission medications   Medication Sig Start Date End Date Taking? Authorizing Provider  acetaminophen  (TYLENOL ) 500 MG tablet Take 1 tablet (500 mg total) by mouth every 6 (six) hours as needed. 11/11/16   Dansie, William, PA-C  ferrous sulfate  325 (65 FE) MG tablet Take 1 tablet (325 mg total) by mouth 2 (two) times daily with a meal. Patient not taking: Reported on 04/07/2016 02/12/16   Desiderio Chew, PA-C  ibuprofen  (ADVIL ,MOTRIN ) 200 MG tablet Take 200 mg by mouth every 4 (four) hours as needed for fever, headache, mild pain, moderate pain or cramping.    [provider]  meclizine  (ANTIVERT ) 25 MG tablet Take 1 tablet (25 mg total) by mouth 3 (three) times daily as needed for dizziness. 11/11/16   Dansie, William, PA-C  ondansetron  (ZOFRAN -ODT) 4 MG disintegrating tablet Take 4 mg by mouth every 8 (eight) hours as needed for nausea or vomiting.    [provider]  Prenatal Vit-Fe Fumarate-FA (PRENATAL VITAMINS) 28-0.8 MG TABS Take 1 tablet by mouth as directed. 11/11/16   Dansie, William, PA-C  hydrALAZINE  (APRESOLINE ) 50 MG tablet Take 1 tablet (50 mg total) by mouth every 8 (eight) hours. Patient not  taking: Reported on 02/12/2016 01/13/16 02/12/16  Elgergawy, Brayton RAMAN, MD    Physical Exam: BP 112/70 (BP Location: Left Arm)   Pulse 60   Temp 97.7 F (36.5 C) (Oral)   Resp 16   Ht 5' 1 (1.549 m)   Wt 54.4 kg   SpO2 100%   BMI 22.67 kg/m  General: Pleasant, well-appearing *** laying in bed. No acute distress. HEENT: [place]/AT. Anicteric sclera CV: RRR. No murmurs, rubs, or gallops. No LE edema Pulmonary: Lungs CTAB. Normal effort. No wheezing or rales. Abdominal: Soft, nontender,  nondistended. Normal bowel sounds. Extremities: Palpable radial and DP pulses. Normal ROM. Skin: Warm and dry. No obvious rash or lesions. Neuro: A&Ox3. Moves all extremities. Normal sensation to light touch. No focal deficit. Psych: Normal mood and affect          Labs on Admission:  Basic Metabolic Panel: Recent Labs  Lab 07/04/24 1120  NA 136  K 3.8  CL 100  CO2 22  GLUCOSE 76  BUN 9  CREATININE 0.56  CALCIUM 9.7   Liver Function Tests: Recent Labs  Lab 07/04/24 1918  AST 34  ALT 26  ALKPHOS 137*  BILITOT 0.3  PROT 7.9  ALBUMIN 4.3   No results for input(s): LIPASE, AMYLASE in the last 168 hours. No results for input(s): AMMONIA in the last 168 hours. CBC: Recent Labs  Lab 07/04/24 1120  WBC 5.6  HGB 9.7*  HCT 33.2*  MCV 79.2*  PLT 321   Cardiac Enzymes: No results for input(s): CKTOTAL, CKMB, CKMBINDEX, TROPONINI in the last 168 hours. BNP (last 3 results) No results for input(s): BNP in the last 8760 hours.  ProBNP (last 3 results) No results for input(s): PROBNP in the last 8760 hours.  CBG: No results for input(s): GLUCAP in the last 168 hours.  Radiological Exams on Admission: CT CHEST ABDOMEN PELVIS W CONTRAST Result Date: 07/04/2024 CLINICAL DATA:  Palpable abnormality in the left anterior chest wall below the left breast EXAM: CT CHEST, ABDOMEN, AND PELVIS WITH CONTRAST TECHNIQUE: Multidetector CT imaging of the chest, abdomen and pelvis was performed following the standard protocol during bolus administration of intravenous contrast. RADIATION DOSE REDUCTION: This exam was performed according to the departmental dose-optimization program which includes automated exposure control, adjustment of the mA and/or kV according to patient size and/or use of iterative reconstruction technique. CONTRAST:  OMNIPAQUE  IOHEXOL  300 MG/ML  SOLN COMPARISON:  None Available. FINDINGS: CT CHEST FINDINGS Cardiovascular: Heart is not  significantly enlarged in size. Thoracic aorta and pulmonary artery as visualized are within normal limits. No coronary calcifications are seen. Mediastinum/Nodes: Thoracic inlet is within normal limits. No hilar or mediastinal adenopathy is noted. Some residual thymus tissue is noted in the anterior mediastinum. The esophagus as visualized is within normal limits. Lungs/Pleura: Lungs are well aerated bilaterally. No focal infiltrate or sizable effusion is noted. Musculoskeletal: No acute rib fracture is noted. No compression deformity is seen. In the anterior left chest wall surrounding the costal cartilage of the sixth, seventh and eighth ribs on the left there is an irregular approximately 6.4 by 3.7 cm area of inflammation with small areas of fluid within. There is an area of fluid measuring up to 2.3 x 1.2 cm best seen on image number 44 this would correspond to that seen on prior ultrasound several weeks ago. Some mass effect upon the adjacent liver is noted. No encroachment into the liver is seen. Infection is felt to be primary consideration with  neoplasm being less likely although not completely excluded on the basis of this exam. Remote trauma with localized hematoma could not be excluded although no traumatic history is given. CT ABDOMEN PELVIS FINDINGS Hepatobiliary: No focal liver abnormality is seen. No gallstones, gallbladder wall thickening, or biliary dilatation. Pancreas: Unremarkable. No pancreatic ductal dilatation or surrounding inflammatory changes. Spleen: Normal in size without focal abnormality. Adrenals/Urinary Tract: Adrenal glands are within normal limits. Kidneys demonstrate a normal enhancement pattern bilaterally. No renal calculi or obstructive changes are seen. The bladder is partially distended. Stomach/Bowel: Scattered fecal material is noted throughout the colon. No obstructive or inflammatory changes are noted. The appendix is within normal limits. Small bowel and stomach are  unremarkable. Vascular/Lymphatic: No significant vascular findings are present. No enlarged abdominal or pelvic lymph nodes. Reproductive: Uterus is unremarkable. Simple appearing cyst is noted within the right ovary measuring 3.1 cm. Other: No abdominal wall hernia or abnormality. No abdominopelvic ascites. Musculoskeletal: No acute bony abnormality in the abdomen and pelvis. IMPRESSION: Changes about the costal cartilage of the left sixth through eighth ribs as described. Infection is again felt to be most likely. Outpatient tissue sampling and or fluid sampling is recommended for further evaluation. Right ovarian simple-appearing cyst measuring 3.1 cm. No follow-up imaging is recommended. Reference: JACR 2020 Feb;17(2):248-254 Electronically Signed   By: Oneil Devonshire M.D.   On: 07/04/2024 20:35   Assessment/Plan CALLIE FACEY is a 33 y.o. 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 Inpatient status is judged to be reasonable and necessary in order to provide the required intensity of service to ensure the patient's safety. The patient's presenting symptoms, physical exam findings, and initial radiographic and laboratory data in the context of their chronic comorbidities is felt to place them at high risk for further clinical deterioration. Furthermore, it is not anticipated that the patient will be medically stable for discharge from the hospital within 2 midnights of admission.   * I certify that at the point of admission it is my clinical  judgment that the patient will require inpatient hospital care spanning beyond 2 midnights from the point of admission due to high intensity of service, high risk for further deterioration and high frequency of surveillance required.*  Level of care: Med-Surg    Lou Claretta HERO, MD 07/04/2024, 10:14 PM [facility] Pager: [phone] Isaiah 41:10   If 7PM-7AM, please contact night-coverage [email or web address] Password [email or web address]

## 2024-07-04 NOTE — ED Triage Notes (Addendum)
 Patient said she has has a hard ball mass under her left breast for 6 months. Stated it has gotten bigger, hard when she touches it. Denies nipple drainage. Was told she needs a CT scan.

## 2024-07-04 NOTE — ED Provider Notes (Signed)
 [place] EMERGENCY DEPARTMENT AT [facility] Provider Note   CSN: [id number] Arrival date & time: 07/04/24  1055     Patient presents with: Breast Mass   April [name] is a 33 y.o. female patient with past medical history of IV drug use with history of bacteremia and epidural abscess, GERD, depression, tobacco use is presenting to emergency room with complaint of chest nodule.  Patient reports that she has had a nodule to her left chest wall with surrounding erythema and pain that has been ongoing for approximately 6 months but worsening.  She reports that she was seen here somewhat recently and told she needed to have a CT scan of her chest and abdmoen to further characterize what this mass is.  She denies any fevers, spontaneous drainage of the area, overlying redness or nipple drainage. Reports has not used IVDU in 3-4 months, reports history of heroin use.    HPI     Prior to Admission medications   Medication Sig Start Date End Date Taking? Authorizing Provider  acetaminophen  (TYLENOL ) 500 MG tablet Take 1 tablet (500 mg total) by mouth every 6 (six) hours as needed. Patient not taking: Reported on 07/04/2024 11/11/16   Dansie, William, PA-C  ferrous sulfate  325 (65 FE) MG tablet Take 1 tablet (325 mg total) by mouth 2 (two) times daily with a meal. Patient not taking: Reported on 04/07/2016 02/12/16   Desiderio Chew, PA-C  ibuprofen  (ADVIL ,MOTRIN ) 200 MG tablet Take 200 mg by mouth every 4 (four) hours as needed for fever, headache, mild pain, moderate pain or cramping. Patient not taking: Reported on 07/04/2024    [provider]  meclizine  (ANTIVERT ) 25 MG tablet Take 1 tablet (25 mg total) by mouth 3 (three) times daily as needed for dizziness. Patient not taking: Reported on 07/04/2024 11/11/16   Dansie, William, PA-C  ondansetron  (ZOFRAN -ODT) 4 MG disintegrating tablet Take 4 mg by mouth every 8 (eight) hours as needed for nausea or vomiting. Patient not  taking: Reported on 07/04/2024    [provider]  Prenatal Vit-Fe Fumarate-FA (PRENATAL VITAMINS) 28-0.8 MG TABS Take 1 tablet by mouth as directed. Patient not taking: Reported on 07/04/2024 11/11/16   Dansie, William, PA-C  hydrALAZINE  (APRESOLINE ) 50 MG tablet Take 1 tablet (50 mg total) by mouth every 8 (eight) hours. Patient not taking: Reported on 02/12/2016 01/13/16 02/12/16  Elgergawy, Brayton RAMAN, MD    Allergies: Nsaids, Tamiflu  [oseltamivir  phosphate], and Codeine     Review of Systems  Skin:  Positive for rash.    Updated Vital Signs BP 112/70 (BP Location: Left Arm)   Pulse 60   Temp 98.3 F (36.8 C)   Resp 16   Ht 5' 1 (1.549 m)   Wt 54.4 kg   SpO2 100%   BMI 22.67 kg/m   Physical Exam Vitals and nursing note reviewed.  Constitutional:      General: She is not in acute distress.    Appearance: She is not toxic-appearing.  HENT:     Head: Normocephalic and atraumatic.  Eyes:     General: No scleral icterus.    Conjunctiva/sclera: Conjunctivae normal.  Cardiovascular:     Rate and Rhythm: Normal rate and regular rhythm.     Pulses: Normal pulses.     Heart sounds: Normal heart sounds.  Pulmonary:     Effort: Pulmonary effort is normal. No respiratory distress.     Breath sounds: Normal breath sounds.  Abdominal:  General: Abdomen is flat. Bowel sounds are normal.     Palpations: Abdomen is soft.     Tenderness: There is no abdominal tenderness.  Musculoskeletal:     Right lower leg: No edema.     Left lower leg: No edema.     Comments: Soft tissue mass to left chest wall under left breast. No significant area of fluctuance nor overlying erythema.  Skin:    General: Skin is warm and dry.     Findings: No lesion.  Neurological:     General: No focal deficit present.     Mental Status: She is alert and oriented to person, place, and time. Mental status is at baseline.     (all labs ordered are listed, but only abnormal results are  displayed) Labs Reviewed  CBC - Abnormal; Notable for the following components:      Result Value   Hemoglobin 9.7 (*)    HCT 33.2 (*)    MCV 79.2 (*)    MCH 23.2 (*)    MCHC 29.2 (*)    RDW 17.6 (*)    All other components within normal limits  HEPATIC FUNCTION PANEL - Abnormal; Notable for the following components:   Alkaline Phosphatase 137 (*)    All other components within normal limits  CULTURE, BLOOD (ROUTINE X 2)  CULTURE, BLOOD (ROUTINE X 2)  BASIC METABOLIC PANEL WITH GFR  HCG, SERUM, QUALITATIVE  HIV ANTIBODY (ROUTINE TESTING W REFLEX)  BASIC METABOLIC PANEL WITH GFR  CBC    EKG: None  Radiology: CT CHEST ABDOMEN PELVIS W CONTRAST Result Date: 07/04/2024 CLINICAL DATA:  Palpable abnormality in the left anterior chest wall below the left breast EXAM: CT CHEST, ABDOMEN, AND PELVIS WITH CONTRAST TECHNIQUE: Multidetector CT imaging of the chest, abdomen and pelvis was performed following the standard protocol during bolus administration of intravenous contrast. RADIATION DOSE REDUCTION: This exam was performed according to the departmental dose-optimization program which includes automated exposure control, adjustment of the mA and/or kV according to patient size and/or use of iterative reconstruction technique. CONTRAST:  OMNIPAQUE  IOHEXOL  300 MG/ML  SOLN COMPARISON:  None Available. FINDINGS: CT CHEST FINDINGS Cardiovascular: Heart is not significantly enlarged in size. Thoracic aorta and pulmonary artery as visualized are within normal limits. No coronary calcifications are seen. Mediastinum/Nodes: Thoracic inlet is within normal limits. No hilar or mediastinal adenopathy is noted. Some residual thymus tissue is noted in the anterior mediastinum. The esophagus as visualized is within normal limits. Lungs/Pleura: Lungs are well aerated bilaterally. No focal infiltrate or sizable effusion is noted. Musculoskeletal: No acute rib fracture is noted. No compression deformity is  seen. In the anterior left chest wall surrounding the costal cartilage of the sixth, seventh and eighth ribs on the left there is an irregular approximately 6.4 by 3.7 cm area of inflammation with small areas of fluid within. There is an area of fluid measuring up to 2.3 x 1.2 cm best seen on image number 44 this would correspond to that seen on prior ultrasound several weeks ago. Some mass effect upon the adjacent liver is noted. No encroachment into the liver is seen. Infection is felt to be primary consideration with neoplasm being less likely although not completely excluded on the basis of this exam. Remote trauma with localized hematoma could not be excluded although no traumatic history is given. CT ABDOMEN PELVIS FINDINGS Hepatobiliary: No focal liver abnormality is seen. No gallstones, gallbladder wall thickening, or biliary dilatation. Pancreas: Unremarkable. No pancreatic  ductal dilatation or surrounding inflammatory changes. Spleen: Normal in size without focal abnormality. Adrenals/Urinary Tract: Adrenal glands are within normal limits. Kidneys demonstrate a normal enhancement pattern bilaterally. No renal calculi or obstructive changes are seen. The bladder is partially distended. Stomach/Bowel: Scattered fecal material is noted throughout the colon. No obstructive or inflammatory changes are noted. The appendix is within normal limits. Small bowel and stomach are unremarkable. Vascular/Lymphatic: No significant vascular findings are present. No enlarged abdominal or pelvic lymph nodes. Reproductive: Uterus is unremarkable. Simple appearing cyst is noted within the right ovary measuring 3.1 cm. Other: No abdominal wall hernia or abnormality. No abdominopelvic ascites. Musculoskeletal: No acute bony abnormality in the abdomen and pelvis. IMPRESSION: Changes about the costal cartilage of the left sixth through eighth ribs as described. Infection is again felt to be most likely. Outpatient tissue sampling  and or fluid sampling is recommended for further evaluation. Right ovarian simple-appearing cyst measuring 3.1 cm. No follow-up imaging is recommended. Reference: JACR 2020 Feb;17(2):248-254 Electronically Signed   By: April [name] M.D.   On: 07/04/2024 20:35     Procedures   Medications Ordered in the ED  vancomycin  (VANCOCIN ) IVPB 1000 mg/200 mL premix (1,000 mg Intravenous New Bag/Given 07/04/24 2259)  acetaminophen  (TYLENOL ) tablet 650 mg (has no administration in time range)    Or  acetaminophen  (TYLENOL ) suppository 650 mg (has no administration in time range)  senna-docusate (Senokot-S) tablet 1 tablet (has no administration in time range)  bisacodyl  (DULCOLAX) EC tablet 5 mg (has no administration in time range)  ondansetron  (ZOFRAN ) tablet 4 mg (has no administration in time range)    Or  ondansetron  (ZOFRAN ) injection 4 mg (has no administration in time range)  enoxaparin (LOVENOX) injection 40 mg (has no administration in time range)  ketorolac  (TORADOL ) 15 MG/ML injection 15 mg (15 mg Intravenous Given 07/04/24 1850)  iohexol  (OMNIPAQUE ) 300 MG/ML solution 100 mL (100 mLs Intravenous Contrast Given 07/04/24 1925)  HYDROmorphone  (DILAUDID ) injection 0.5 mg (0.5 mg Intravenous Given 07/04/24 2202)  ondansetron  (ZOFRAN ) injection 4 mg (4 mg Intravenous Given 07/04/24 2202)  ceFEPIme (MAXIPIME) 2 g in sodium chloride  0.9 % 100 mL IVPB (2 g Intravenous New Bag/Given 07/04/24 2221)                                    Medical Decision Making Amount and/or Complexity of Data Reviewed Labs: ordered. Radiology: ordered.  Risk Prescription drug management. Decision regarding hospitalization.   This patient presents to the ED for concern of abdominal pain, this involves an extensive number of treatment options, and is a complaint that carries with it a high risk of complications and morbidity.  The differential diagnosis includes cholecystitis, AAA, appendicitis, renal stone,  UTI   Co morbidities that complicate the patient evaluation  Hx of IVDU   Additional history obtained:  Additional history obtained from recent ED visit 06/15/2024 patient was offered CT scan but decided to leave AGAINST MEDICAL ADVICE.  She is back to obtain CT scan today. Has history of MRSA bacteriemia on chart review.    Lab Tests:  I personally interpreted labs.  The pertinent results include:   CT scan shows no leukocytosis.  Hemoglobin is 9.7 which is similar to prior recent labs 2 weeks ago.  BMP unremarkable.   Imaging Studies ordered:  I ordered imaging studies including the scan of the chest abdomen pelvis. I independently visualized and interpreted  imaging which showed area of left chest wall surrounding the 6th, 7th and 8th ribs with irregular mass approximately 6.4 x 3.7 cm with surrounding fluid collection.  There is no mass effect on the liver.  Differential primarily thought to be an infection although neoplasm versus hematoma is on differential diagnosis. I agree with the radiologist interpretation   Cardiac Monitoring: / EKG:  The patient was maintained on a cardiac monitor.     Consultations Obtained:  I requested consultation with the Dr. [facility],  and discussed lab and imaging findings as well as pertinent plan - they recommend: Starting IV antibiotics and having ultrasound-guided tissue sampling done during admission stay recommending consulting hospitalist. I have consulted the hospital team for admission.   Problem List / ED Course / Critical interventions / Medication management  Patient reports to ER with complaint of mass to upper left abd/chest wall that has been present for months. No overlying cellulitis and no area of fluctuance. I did order CT scan to further characterize. This shows a large irregular mass with surrounding edema favoring infectious etiology. Given concern started on IV Abx. She does appear to have MRSA hx and IVDU history  thus blood cultures were ordered. No fever, no leukocytosis with this. I did consider the ddx of neoplasm, I consulted hem-onc for this. They are untimely recommending IV Abx and admission to the hospital team for further workup and recommending IR eval for possible soft tissue sampling.  I ordered medication including cefepime, Vanco. Reevaluation of the patient after these medicines showed that the patient stayed the same I have reviewed the patients home medicines and have made adjustments as needed.      Final diagnoses:  Soft tissue mass    ED Discharge Orders     None          Shermon Warren SAILOR, PA-C 07/04/24 2331    Ula Prentice SAUNDERS, MD 07/05/24 [phone]

## 2024-07-04 NOTE — Progress Notes (Signed)
 ED Pharmacy Antibiotic Sign Off An antibiotic consult was received from an ED provider for Vancomycin  and Cefepime per pharmacy dosing for wound infection. A chart review was completed to assess appropriateness.   The following one time order(s) were placed:  Vancomycin  1g IV  Cefepime 2g IV  Further antibiotic and/or antibiotic pharmacy consults should be ordered by the admitting provider if indicated.   Thank you for allowing pharmacy to be a part of this patient's care.   Arvin Gauss, PharmD Clinical Pharmacist 07/04/24 10:11 PM

## 2024-07-05 ENCOUNTER — Inpatient Hospital Stay (HOSPITAL_COMMUNITY)

## 2024-07-05 DIAGNOSIS — L02213 Cutaneous abscess of chest wall: Secondary | ICD-10-CM | POA: Diagnosis not present

## 2024-07-05 DIAGNOSIS — F1191 Opioid use, unspecified, in remission: Secondary | ICD-10-CM

## 2024-07-05 DIAGNOSIS — M7989 Other specified soft tissue disorders: Principal | ICD-10-CM

## 2024-07-05 DIAGNOSIS — D509 Iron deficiency anemia, unspecified: Secondary | ICD-10-CM

## 2024-07-05 LAB — IRON AND TIBC
Iron: 45 ug/dL (ref 28–170)
Saturation Ratios: 12 % (ref 10.4–31.8)
TIBC: 392 ug/dL (ref 250–450)
UIBC: 347 ug/dL

## 2024-07-05 LAB — CBC
HCT: 31.6 % — ABNORMAL LOW (ref 36.0–46.0)
Hemoglobin: 9.6 g/dL — ABNORMAL LOW (ref 12.0–15.0)
MCH: 24 pg — ABNORMAL LOW (ref 26.0–34.0)
MCHC: 30.4 g/dL (ref 30.0–36.0)
MCV: 79 fL — ABNORMAL LOW (ref 80.0–100.0)
Platelets: 321 K/uL (ref 150–400)
RBC: 4 MIL/uL (ref 3.87–5.11)
RDW: 17.6 % — ABNORMAL HIGH (ref 11.5–15.5)
WBC: 6.5 K/uL (ref 4.0–10.5)
nRBC: 0 % (ref 0.0–0.2)

## 2024-07-05 LAB — BASIC METABOLIC PANEL WITH GFR
Anion gap: 9 (ref 5–15)
BUN: 11 mg/dL (ref 6–20)
CO2: 25 mmol/L (ref 22–32)
Calcium: 9.2 mg/dL (ref 8.9–10.3)
Chloride: 102 mmol/L (ref 98–111)
Creatinine, Ser: 0.47 mg/dL (ref 0.44–1.00)
GFR, Estimated: >60 mL/min (ref >60)
Glucose, Bld: 86 mg/dL (ref 70–99)
Potassium: 4 mmol/L (ref 3.5–5.1)
Sodium: 136 mmol/L (ref 135–145)

## 2024-07-05 LAB — PROTIME-INR
INR: 0.9 (ref 0.8–1.2)
Prothrombin Time: 13 s (ref 11.4–15.2)

## 2024-07-05 LAB — HIV ANTIBODY (ROUTINE TESTING W REFLEX): HIV Screen 4th Generation wRfx: NONREACTIVE

## 2024-07-05 LAB — MRSA NEXT GEN BY PCR, NASAL: MRSA by PCR Next Gen: DETECTED — AB

## 2024-07-05 LAB — FERRITIN: Ferritin: 15 ng/mL (ref 11–307)

## 2024-07-05 LAB — VITAMIN B12: Vitamin B-12: 553 pg/mL (ref 180–914)

## 2024-07-05 MED ORDER — HYDROMORPHONE HCL 1 MG/ML IJ SOLN
0.5000 mg | Freq: Once | INTRAMUSCULAR | Status: AC
  Administered 2024-07-05: 0.5 mg via INTRAVENOUS
  Filled 2024-07-05: qty 0.5

## 2024-07-05 MED ORDER — TRAMADOL HCL 50 MG PO TABS
50.0000 mg | ORAL_TABLET | Freq: Four times a day (QID) | ORAL | Status: DC | PRN
  Administered 2024-07-05: 50 mg via ORAL
  Filled 2024-07-05: qty 1

## 2024-07-05 MED ORDER — VANCOMYCIN HCL 750 MG/150ML IV SOLN
750.0000 mg | Freq: Two times a day (BID) | INTRAVENOUS | Status: DC
  Administered 2024-07-05 – 2024-07-07 (×5): 750 mg via INTRAVENOUS
  Filled 2024-07-05 (×6): qty 150

## 2024-07-05 MED ORDER — MUPIROCIN 2 % EX OINT
1.0000 | TOPICAL_OINTMENT | Freq: Two times a day (BID) | CUTANEOUS | Status: AC
  Administered 2024-07-05 – 2024-07-09 (×10): 1 via NASAL
  Filled 2024-07-05 (×2): qty 22

## 2024-07-05 MED ORDER — ENOXAPARIN SODIUM 40 MG/0.4ML IJ SOSY
40.0000 mg | PREFILLED_SYRINGE | INTRAMUSCULAR | Status: DC
  Filled 2024-07-05: qty 0.4

## 2024-07-05 MED ORDER — SODIUM CHLORIDE 0.9 % IV SOLN
2.0000 g | Freq: Three times a day (TID) | INTRAVENOUS | Status: DC
  Administered 2024-07-05 – 2024-07-08 (×10): 2 g via INTRAVENOUS
  Filled 2024-07-05 (×11): qty 12.5

## 2024-07-05 MED ORDER — CHLORHEXIDINE GLUCONATE CLOTH 2 % EX PADS
6.0000 | MEDICATED_PAD | Freq: Every day | CUTANEOUS | Status: AC
  Administered 2024-07-05 – 2024-07-09 (×5): 6 via TOPICAL

## 2024-07-05 MED ORDER — LACTATED RINGERS IV SOLN: INTRAVENOUS | Status: AC

## 2024-07-05 MED ORDER — OXYCODONE-ACETAMINOPHEN 5-325 MG PO TABS
1.0000 | ORAL_TABLET | Freq: Four times a day (QID) | ORAL | Status: DC | PRN
  Administered 2024-07-06 (×3): 2 via ORAL
  Filled 2024-07-05 (×3): qty 2

## 2024-07-05 MED ORDER — OXYCODONE-ACETAMINOPHEN 5-325 MG PO TABS
1.0000 | ORAL_TABLET | Freq: Four times a day (QID) | ORAL | Status: DC | PRN
  Administered 2024-07-05: 1 via ORAL
  Filled 2024-07-05: qty 1

## 2024-07-05 NOTE — Progress Notes (Signed)
 Pharmacy Antibiotic Note  April [name] is a 33 y.o. female admitted on 07/04/2024 with chest wall wound infection/cellulitis.  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  Pharmacy has been consulted for Vancomycin  and Cefepime dosing.  Plan: Cefepime 2g IV q8h Vancomycin  750 mg IV Q 12 hrs. Goal AUC 400-550.  Expected AUC: 431.7  SCr used: 0.6 (rounded up from 0.56) Follow renal function F/u culture results & sensitivities  Height: 5' 1 (154.9 cm) Weight: 54.4 kg (120 lb) IBW/kg (Calculated) : 47.8  Temp (24hrs), Avg:98 F (36.7 C), Min:97.7 F (36.5 C), Max:98.5 F (36.9 C)  Recent Labs  Lab 07/04/24 1120  WBC 5.6  CREATININE 0.56    Estimated Creatinine Clearance: 76.2 mL/min (by C-G formula based on SCr of 0.56 mg/dL).    Allergies  Allergen Reactions   Nsaids Other (See Comments)    Abdominal pain    Tamiflu  [Oseltamivir  Phosphate] Hives, Itching and Nausea And Vomiting   Codeine  Nausea And Vomiting    Specifically tylenol  3    Antimicrobials this admission: 10/6 Cefepime >>   10/6 Vancomycin  >>    Dose adjustments this admission:    Microbiology results: 10/6 BCx:      Thank you for allowing pharmacy to be a part of this patient's care.  Devone Bonilla, PharmD 07/05/2024 1:50 AM

## 2024-07-05 NOTE — Progress Notes (Signed)
 PROGRESS NOTE    April [name]  FMW:[id number] DOB: 1991-01-16 DOA: 07/04/2024 PCP: [facility]   Brief Narrative:  April [name] is a 33 y.o. 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 Imaging concerning for abscess.  Assessment & Plan:   Principal Problem:   Chest wall abscess Active Problems:   Depression   Soft tissue mass   Microcytic anemia   History of heroin use  Left chest wall abscess Concurrent chest wall cellulitis Patient does not meet sepsis criteria - History of IV drug abuse reported remission for the past 3 to 4 months, noted left chest wall mass for 6 months with worsening erythema and pain. - CT notes 6.4 x 3.7 cm area of inflammation underneath the left 6th, 7th and 8th ribs with small notable area of fluid. - Oncology consulted in ED recommending tissue sampling - IR consulted for tissue sampling of left chest wall nodule/I&D - Continue IV vancomycin  and cefepime -low threshold to de-escalate pending I&D - Continue IV fluids, no cultures taken at admission - Avoid IV narcotics unless absolutely necessary given patient's history - well-controlled currently on low-dose oxycodone    Acute on chronic microcytic anemia - Hgb 9.7 from baseline of around 11-12 - Denies bleeding or easy bruising - Iron panel within normal limits   Prior history of IV drug abuse with heroin - Previous episode of bacteremia and epidural abscess - Reports last use 3 to 4 months ago  Depression - Currently not on any medications   DVT prophylaxis: enoxaparin (LOVENOX) injection 40 mg Start: 07/05/24 1000   Code Status:   Code Status: Full Code  Family Communication: Husband at bedside  Status is: Inpatient  Dispo: The patient is from:  Home              Anticipated d/c is to: Home              Anticipated d/c date is: 24 to 48 hours              Patient currently not medically stable for discharge  Consultants:  Interventional radiology, oncology  Procedures:  I&D planned 07/05/2024  Antimicrobials:  Vancomycin , cefepime  Subjective: No acute issues or events overnight denies nausea vomiting diarrhea constipation headache fevers chills or chest pain.  Continues to complain of generalized pain around presumed abscess but appears comfortable at bedside.  Objective: Vitals:   07/04/24 1752 07/04/24 2308 07/05/24 0042 07/05/24 0649  BP: 112/70  109/69 97/63  Pulse: 60  71 68  Resp: 16  18   Temp: 97.7 F (36.5 C) 98.3 F (36.8 C) 97.8 F (36.6 C) 97.8 F (36.6 C)  TempSrc: Oral  Oral Oral  SpO2: 100%  100% 99%  Weight:      Height:        Intake/Output Summary (Last 24 hours) at 07/05/2024 0725 Last data filed at 07/05/2024 0600 Gross per 24 hour  Intake 450.79 ml  Output --  Net 450.79 ml   Filed Weights   07/04/24 1102  Weight: 54.4 kg    Examination:  General:  Pleasantly resting in bed, No acute distress. HEENT:  Normocephalic atraumatic.  Sclerae nonicteric, noninjected.  Extraocular movements intact bilaterally. Neck:  Without mass or deformity.  Trachea is midline. Lungs:  Clear to auscultate  bilaterally without rhonchi, wheeze, or rales. Heart:  Regular rate and rhythm.  Without murmurs, rubs, or gallops. Abdomen:  Soft, nontender, nondistended.  Without guarding or rebound. Extremities: Without cyanosis, clubbing, edema, or obvious deformity. Skin: Small blanching erythematous mass inferior to the left breast around ribs 7 at the midclavicular line  Data Reviewed: I have personally reviewed following labs and imaging studies  CBC: Recent Labs  Lab 07/04/24 1120 07/05/24 0501  WBC 5.6 6.5  HGB 9.7* 9.6*  HCT 33.2* 31.6*  MCV 79.2* 79.0*  PLT 321 321   Basic Metabolic  Panel: Recent Labs  Lab 07/04/24 1120 07/05/24 0501  NA 136 136  K 3.8 4.0  CL 100 102  CO2 22 25  GLUCOSE 76 86  BUN 9 11  CREATININE 0.56 0.47  CALCIUM 9.7 9.2   GFR: Estimated Creatinine Clearance: 76.2 mL/min (by C-G formula based on SCr of 0.47 mg/dL). Liver Function Tests: Recent Labs  Lab 07/04/24 1918  AST 34  ALT 26  ALKPHOS 137*  BILITOT 0.3  PROT 7.9  ALBUMIN 4.3   Anemia Panel: Recent Labs    07/05/24 0501  VITAMINB12 553  FERRITIN 15  TIBC 392  IRON 45   No results found for this or any previous visit (from the past 240 hours).   Radiology Studies: CT CHEST ABDOMEN PELVIS W CONTRAST Result Date: 07/04/2024 CLINICAL DATA:  Palpable abnormality in the left anterior chest wall below the left breast EXAM: CT CHEST, ABDOMEN, AND PELVIS WITH CONTRAST TECHNIQUE: Multidetector CT imaging of the chest, abdomen and pelvis was performed following the standard protocol during bolus administration of intravenous contrast. RADIATION DOSE REDUCTION: This exam was performed according to the departmental dose-optimization program which includes automated exposure control, adjustment of the mA and/or kV according to patient size and/or use of iterative reconstruction technique. CONTRAST:  OMNIPAQUE  IOHEXOL  300 MG/ML  SOLN COMPARISON:  None Available. FINDINGS: CT CHEST FINDINGS Cardiovascular: Heart is not significantly enlarged in size. Thoracic aorta and pulmonary artery as visualized are within normal limits. No coronary calcifications are seen. Mediastinum/Nodes: Thoracic inlet is within normal limits. No hilar or mediastinal adenopathy is noted. Some residual thymus tissue is noted in the anterior mediastinum. The esophagus as visualized is within normal limits. Lungs/Pleura: Lungs are well aerated bilaterally. No focal infiltrate or sizable effusion is noted. Musculoskeletal: No acute rib fracture is noted. No compression deformity is seen. In the anterior left chest  wall surrounding the costal cartilage of the sixth, seventh and eighth ribs on the left there is an irregular approximately 6.4 by 3.7 cm area of inflammation with small areas of fluid within. There is an area of fluid measuring up to 2.3 x 1.2 cm best seen on image number 44 this would correspond to that seen on prior ultrasound several weeks ago. Some mass effect upon the adjacent liver is noted. No encroachment into the liver is seen. Infection is felt to be primary consideration with neoplasm being less likely although not completely excluded on the basis of this exam. Remote trauma with localized hematoma could not be excluded although no traumatic history is given. CT ABDOMEN PELVIS FINDINGS Hepatobiliary: No focal liver abnormality is seen. No gallstones, gallbladder wall thickening, or biliary dilatation. Pancreas: Unremarkable. No pancreatic ductal dilatation or surrounding inflammatory changes. Spleen: Normal in size without focal abnormality. Adrenals/Urinary Tract: Adrenal glands are within normal limits. Kidneys demonstrate a normal enhancement pattern bilaterally. No renal calculi or obstructive changes are seen. The bladder is  partially distended. Stomach/Bowel: Scattered fecal material is noted throughout the colon. No obstructive or inflammatory changes are noted. The appendix is within normal limits. Small bowel and stomach are unremarkable. Vascular/Lymphatic: No significant vascular findings are present. No enlarged abdominal or pelvic lymph nodes. Reproductive: Uterus is unremarkable. Simple appearing cyst is noted within the right ovary measuring 3.1 cm. Other: No abdominal wall hernia or abnormality. No abdominopelvic ascites. Musculoskeletal: No acute bony abnormality in the abdomen and pelvis. IMPRESSION: Changes about the costal cartilage of the left sixth through eighth ribs as described. Infection is again felt to be most likely. Outpatient tissue sampling and or fluid sampling is  recommended for further evaluation. Right ovarian simple-appearing cyst measuring 3.1 cm. No follow-up imaging is recommended. Reference: JACR 2020 Feb;17(2):248-254 Electronically Signed   By: Oneil Devonshire M.D.   On: 07/04/2024 20:35    Scheduled Meds:  enoxaparin (LOVENOX) injection  40 mg Subcutaneous Q24H   Continuous Infusions:  ceFEPime (MAXIPIME) IV 2 g (07/05/24 0555)   lactated ringers  100 mL/hr at 07/05/24 0219   vancomycin        LOS: 1 day   Time spent:  Elsie JAYSON Montclair, DO [facility]  If 7PM-7AM, please contact night-coverage [email or web address]  07/05/2024, 7:25 AM

## 2024-07-05 NOTE — Plan of Care (Signed)
   Problem: Clinical Measurements: Goal: Ability to maintain clinical measurements within normal limits will improve Outcome: Progressing

## 2024-07-05 NOTE — Consult Note (Signed)
 Chief Complaint: Left anterior chest wall pain/mass (underneath breast); referred for image guided left chest wall mass biopsy  Referring Provider(s): [place],P  Supervising Physician: Karalee Beat  Patient Status: [facility] - In-pt  History of Present Illness: April [name] is a 33 y.o. female  with past medical history significant for IVDU with history of bacteremia and epidural abscess, GERD, depression, tobacco use disorder and HSV infection who presented to the [facility] ED 10/6 for evaluation of enlarging mass underneath her left breast .  Patient reports that 6 months ago, she noticed a small nodule underneath her left breast.  Over the last few weeks, it has increased in size and is now tender with surrounding erythema.  She is unable to lay on her left side due to the pain.  She endorses intermittent nausea over the last week but denies any fevers, chills, abdominal pain, chest pain, spontaneous drainage of the area, any bites to the area, nipple discharge, or breast pain.  Reports a history of IV heroin use but has been in remission since 3-4 months ago.  She is currently afebrile, WBC normal, blood cx pend.  CT chest abdomen pelvis revealed:  Changes about the costal cartilage of the left sixth through eighth ribs as described. Infection is again felt to be most likely. Outpatient tissue sampling and or fluid sampling is recommended for further evaluation.   Right ovarian simple-appearing cyst measuring 3.1 cm.  Request now received for image guided aspiration/biopsy of left anterior chest wall mass      Patient is Full Code  Past Medical History:  Diagnosis Date   Depression    GERD (gastroesophageal reflux disease)    Herpes    Infection    UTI   UTI (urinary tract infection) in pregnancy in first trimester     Past Surgical History:  Procedure Laterality Date   CESAREAN SECTION MULTI-GESTATIONAL  12/31/2015   Procedure: CESAREAN SECTION MULTI-GESTATIONAL;  Surgeon:  Winton Felt, MD;  Location: [facility];  Service: Obstetrics;;   DILATION AND CURETTAGE OF UTERUS      Allergies: Nsaids, Tamiflu  [oseltamivir  phosphate], and Codeine   Medications: Prior to Admission medications   Medication Sig Start Date End Date Taking? Authorizing Provider  acetaminophen  (TYLENOL ) 500 MG tablet Take 1 tablet (500 mg total) by mouth every 6 (six) hours as needed. Patient not taking: Reported on 07/04/2024 11/11/16   Dansie, William, PA-C  ferrous sulfate  325 (65 FE) MG tablet Take 1 tablet (325 mg total) by mouth 2 (two) times daily with a meal. Patient not taking: Reported on 04/07/2016 02/12/16   Desiderio Chew, PA-C  ibuprofen  (ADVIL ,MOTRIN ) 200 MG tablet Take 200 mg by mouth every 4 (four) hours as needed for fever, headache, mild pain, moderate pain or cramping. Patient not taking: Reported on 07/04/2024    [provider]  meclizine  (ANTIVERT ) 25 MG tablet Take 1 tablet (25 mg total) by mouth 3 (three) times daily as needed for dizziness. Patient not taking: Reported on 07/04/2024 11/11/16   Dansie, William, PA-C  ondansetron  (ZOFRAN -ODT) 4 MG disintegrating tablet Take 4 mg by mouth every 8 (eight) hours as needed for nausea or vomiting. Patient not taking: Reported on 07/04/2024    [provider]  Prenatal Vit-Fe Fumarate-FA (PRENATAL VITAMINS) 28-0.8 MG TABS Take 1 tablet by mouth as directed. Patient not taking: Reported on 07/04/2024 11/11/16   Dansie, William, PA-C  hydrALAZINE  (APRESOLINE ) 50 MG tablet Take 1 tablet (50 mg total) by mouth every 8 (eight) hours.  Patient not taking: Reported on 02/12/2016 01/13/16 02/12/16  Elgergawy, Brayton RAMAN, MD     Family History  Problem Relation Age of Onset   Hypertension Maternal Grandmother    Depression Maternal Grandmother    Heart disease Maternal Grandmother    Cancer Maternal Grandfather        lung    Social History   Socioeconomic History   Marital status: Single    Spouse name: Not on file    Number of children: Not on file   Years of education: Not on file   Highest education level: Not on file  Occupational History   Not on file  Tobacco Use   Smoking status: Every Day    Current packs/day: 0.25    Average packs/day: 0.3 packs/day for 17.2 years (4.3 ttl pk-yrs)    Types: Cigarettes    Start date: 04/29/2007   Smokeless tobacco: Never   Tobacco comments:    Declines help  Substance and Sexual Activity   Alcohol use: No   Drug use: No   Sexual activity: Yes    Partners: Male    Birth control/protection: None  Other Topics Concern   Not on file  Social History Narrative   Not on file   Social Drivers of Health   Financial Resource Strain: Not on file  Food Insecurity: No Food Insecurity (07/05/2024)   Hunger Vital Sign    Worried About Running Out of Food in the Last Year: Never true    Ran Out of Food in the Last Year: Never true  Transportation Needs: No Transportation Needs (07/05/2024)   PRAPARE - Administrator, Civil Service (Medical): No    Lack of Transportation (Non-Medical): No  Physical Activity: Not on file  Stress: No Stress Concern Present (05/09/2023)   Received from [facility] - Occupational Stress Questionnaire    Feeling of Stress : Not at all  Social Connections: Unknown (03/01/2023)   Received from [facility]   Social Network    Social Network: Not on file       Review of Systems see above  Vital Signs: BP 97/63 (BP Location: Left Arm)   Pulse 68   Temp 97.8 F (36.6 C) (Oral)   Resp 18   Ht 5' 1 (1.549 m)   Wt 120 lb (54.4 kg)   SpO2 99%   BMI 22.67 kg/m   Advance Care Plan: no documents on file  Physical Exam: Awake, alert.  Chest clear to auscultation bilaterally.  Heart with regular rate and rhythm.  Abdomen soft, positive bowel sounds, some mild generalized tenderness to palpation.  No lower extremity edema.  Erythematous subcu mass underneath left breast,  tender to palpation  Imaging: CT CHEST ABDOMEN PELVIS W CONTRAST Result Date: 07/04/2024 CLINICAL DATA:  Palpable abnormality in the left anterior chest wall below the left breast EXAM: CT CHEST, ABDOMEN, AND PELVIS WITH CONTRAST TECHNIQUE: Multidetector CT imaging of the chest, abdomen and pelvis was performed following the standard protocol during bolus administration of intravenous contrast. RADIATION DOSE REDUCTION: This exam was performed according to the departmental dose-optimization program which includes automated exposure control, adjustment of the mA and/or kV according to patient size and/or use of iterative reconstruction technique. CONTRAST:  OMNIPAQUE  IOHEXOL  300 MG/ML  SOLN COMPARISON:  None Available. FINDINGS: CT CHEST FINDINGS Cardiovascular: Heart is not significantly enlarged in size. Thoracic aorta and pulmonary artery as visualized are within normal limits. No  coronary calcifications are seen. Mediastinum/Nodes: Thoracic inlet is within normal limits. No hilar or mediastinal adenopathy is noted. Some residual thymus tissue is noted in the anterior mediastinum. The esophagus as visualized is within normal limits. Lungs/Pleura: Lungs are well aerated bilaterally. No focal infiltrate or sizable effusion is noted. Musculoskeletal: No acute rib fracture is noted. No compression deformity is seen. In the anterior left chest wall surrounding the costal cartilage of the sixth, seventh and eighth ribs on the left there is an irregular approximately 6.4 by 3.7 cm area of inflammation with small areas of fluid within. There is an area of fluid measuring up to 2.3 x 1.2 cm best seen on image number 44 this would correspond to that seen on prior ultrasound several weeks ago. Some mass effect upon the adjacent liver is noted. No encroachment into the liver is seen. Infection is felt to be primary consideration with neoplasm being less likely although not completely excluded on the basis of this  exam. Remote trauma with localized hematoma could not be excluded although no traumatic history is given. CT ABDOMEN PELVIS FINDINGS Hepatobiliary: No focal liver abnormality is seen. No gallstones, gallbladder wall thickening, or biliary dilatation. Pancreas: Unremarkable. No pancreatic ductal dilatation or surrounding inflammatory changes. Spleen: Normal in size without focal abnormality. Adrenals/Urinary Tract: Adrenal glands are within normal limits. Kidneys demonstrate a normal enhancement pattern bilaterally. No renal calculi or obstructive changes are seen. The bladder is partially distended. Stomach/Bowel: Scattered fecal material is noted throughout the colon. No obstructive or inflammatory changes are noted. The appendix is within normal limits. Small bowel and stomach are unremarkable. Vascular/Lymphatic: No significant vascular findings are present. No enlarged abdominal or pelvic lymph nodes. Reproductive: Uterus is unremarkable. Simple appearing cyst is noted within the right ovary measuring 3.1 cm. Other: No abdominal wall hernia or abnormality. No abdominopelvic ascites. Musculoskeletal: No acute bony abnormality in the abdomen and pelvis. IMPRESSION: Changes about the costal cartilage of the left sixth through eighth ribs as described. Infection is again felt to be most likely. Outpatient tissue sampling and or fluid sampling is recommended for further evaluation. Right ovarian simple-appearing cyst measuring 3.1 cm. No follow-up imaging is recommended. Reference: JACR 2020 Feb;17(2):248-254 Electronically Signed   By: Oneil Devonshire M.D.   On: 07/04/2024 20:35   US  CHEST SOFT TISSUE Result Date: 06/15/2024 CLINICAL DATA:  Left breast mass. EXAM: ULTRASOUND OF CHEST SOFT TISSUES TECHNIQUE: Ultrasound examination of the chest wall soft tissues was performed in the area of clinical concern. COMPARISON:  None Available. FINDINGS: Limited and targeted sonographic images of the soft tissues of the left  breast for evaluation of possible abscess. There is a 2.7 x 0.5 x 2.3 cm hypoechoic area in the region of the concern likely represents a small loculated collection. No internal vascularity or hyperemia in the surrounding tissue on color images. This may represent an inflammatory/infectious process. Other etiologies including malignancy are not excluded and not evaluated by this ultrasound. Correlation with clinical exam and dedicated mammographic studies, as clinically indicated, recommended. Follow-up after treatment is advised to document resolution. IMPRESSION: Small loculated collection. Clinical correlation and follow-up recommended. Please note this study is not a diagnostic ultrasound for possible underlying breast pathology. Electronically Signed   By: Vanetta Chou M.D.   On: 06/15/2024 15:50    Labs:  CBC: Recent Labs    06/15/24 1400 07/04/24 1120 07/05/24 0501  WBC 7.0 5.6 6.5  HGB 9.7* 9.7* 9.6*  HCT 31.5* 33.2* 31.6*  PLT 315  321 321    COAGS: Recent Labs    07/05/24 0952  INR 0.9    BMP: Recent Labs    06/15/24 1400 07/04/24 1120 07/05/24 0501  NA 138 136 136  K 4.1 3.8 4.0  CL 102 100 102  CO2 25 22 25   GLUCOSE 100* 76 86  BUN 10 9 11   CALCIUM 9.7 9.7 9.2  CREATININE 0.52 0.56 0.47  GFRNONAA >60 >60 >60    LIVER FUNCTION TESTS: Recent Labs    07/04/24 1918  BILITOT 0.3  AST 34  ALT 26  ALKPHOS 137*  PROT 7.9  ALBUMIN 4.3    TUMOR MARKERS: No results for input(s): AFPTM, CEA, CA199, CHROMGRNA in the last 8760 hours.  Assessment and Plan: 33 y.o. female  with past medical history significant for IVDU with history of bacteremia and epidural abscess, GERD, depression, tobacco use disorder and HSV infection who presented to the [facility] ED 10/6 for evaluation of enlarging mass underneath her left breast .  Patient reports that 6 months ago, she noticed a small nodule underneath her left breast.  Over the last few weeks, it has increased in  size and is now tender with surrounding erythema.  She is unable to lay on her left side due to the pain.  She endorses intermittent nausea over the last week but denies any fevers, chills, abdominal pain, chest pain, spontaneous drainage of the area, any bites to the area, nipple discharge, or breast pain.  Reports a history of IV heroin use but has been in remission since 3-4 months ago.  She is currently afebrile, WBC normal, blood cx pend.  CT chest abdomen pelvis revealed:  Changes about the costal cartilage of the left sixth through eighth ribs as described. Infection is again felt to be most likely. Outpatient tissue sampling and or fluid sampling is recommended for further evaluation.   Right ovarian simple-appearing cyst measuring 3.1 cm.  Request now received for image guided aspiration/biopsy of left anterior chest wall mass; imaging studies have been reviewed by Dr. Karalee.Risks and benefits of procedure was discussed with the patient/spouse including, but not limited to bleeding, infection, damage to adjacent structures or low yield requiring additional tests.  All of the questions were answered and there is agreement to proceed.  Consent signed and in chart.    Thank you for allowing our service to participate in April [name] 's care.  Electronically Signed: D. Franky Rakers, PA-C   07/05/2024, 10:38 AM      I spent a total of  25 minutes   in face to face in clinical consultation, greater than 50% of which was counseling/coordinating care for image guided aspiration/biopsy of left anterior chest wall mass ii

## 2024-07-05 NOTE — TOC Initial Note (Signed)
 Transition of Care [facility]) - Initial/Assessment Note    Patient Details  Name: April [name] MRN: [id number] Date of Birth: 11-05-1990  Transition of Care [facility]) CM/SW Contact:    Alfonse JONELLE Rex, RN Phone Number: 07/05/2024, 1:26 PM  Clinical Narrative:  Met with patient at bedside to introduce role of TOC/NCM and review for dc planning, patient reports she is in the process of moving from [place] to [place], reports she does not have a PCP in [place] at this time. NCM offered to make and appt for patient at one of [facility] Outpatient clinics, patient reports she will schedule her self an appointment after discharge and finishes move to [place]. Patient reports no current home care services or home DME. Patient reports she feels safe returning home with support from family. TOC will continue to follow.                   Expected Discharge Plan: Home/Self Care Barriers to Discharge: Continued Medical Work up   Patient Goals and CMS Choice Patient states their goals for this hospitalization and ongoing recovery are:: return home          Expected Discharge Plan and Services       Living arrangements for the past 2 months: Single Family Home                                      Prior Living Arrangements/Services Living arrangements for the past 2 months: Single Family Home Lives with:: Relatives Patient language and need for interpreter reviewed:: Yes Do you feel safe going back to the place where you live?: Yes      Need for Family Participation in Patient Care: Yes (Comment) Care giver support system in place?: Yes (comment)   Criminal Activity/Legal Involvement Pertinent to Current Situation/Hospitalization: No - Comment as needed  Activities of Daily Living   ADL Screening (condition at time of admission) Independently performs ADLs?: Yes (appropriate for developmental age) Is the patient deaf or have difficulty hearing?: No Does the patient have  difficulty seeing, even when wearing glasses/contacts?: No Does the patient have difficulty concentrating, remembering, or making decisions?: No  Permission Sought/Granted                  Emotional Assessment Appearance:: Appears stated age Attitude/Demeanor/Rapport: Engaged Affect (typically observed): Accepting Orientation: : Oriented to Self, Oriented to Place, Oriented to  Time, Oriented to Situation Alcohol / Substance Use: Not Applicable Psych Involvement: No (comment)  Admission diagnosis:  Soft tissue mass [M79.89] Chest wall abscess [L02.213] 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 [place] [address] Phone: [phone] Fax: [phone]  [id number] [place],  - [address] AT [facility] [address] [place] [address] Phone: [phone] Fax: [phone]  [id number] - [place],  - [address] AT [facility] [address] [place] [address] Phone: [phone] Fax: [phone]  [place] [facility] [address] [place] [address] Phone: [phone] Fax: [phone]     Social Drivers of Health (SDOH) Social History: SDOH Screenings   Food Insecurity: No Food Insecurity (07/05/2024)  Housing: High Risk (07/05/2024)  Transportation Needs: No Transportation Needs (07/05/2024)  Utilities: Not At Risk (07/05/2024)  Social Connections: Unknown (03/01/2023)   Received from [facility]  Stress: No Stress Concern Present (05/09/2023)   Received from [facility]  Tobacco Use: High Risk (07/04/2024)   SDOH Interventions:     Readmission Risk Interventions    07/05/2024    1:25 PM  Readmission Risk Prevention Plan  Post Dischage Appt Complete  Medication Screening Complete  Transportation Screening Complete

## 2024-07-06 ENCOUNTER — Inpatient Hospital Stay (HOSPITAL_COMMUNITY)

## 2024-07-06 DIAGNOSIS — F32A Depression, unspecified: Secondary | ICD-10-CM | POA: Diagnosis not present

## 2024-07-06 DIAGNOSIS — L02213 Cutaneous abscess of chest wall: Secondary | ICD-10-CM | POA: Diagnosis not present

## 2024-07-06 DIAGNOSIS — D509 Iron deficiency anemia, unspecified: Secondary | ICD-10-CM | POA: Diagnosis not present

## 2024-07-06 DIAGNOSIS — M7989 Other specified soft tissue disorders: Secondary | ICD-10-CM | POA: Diagnosis not present

## 2024-07-06 HISTORY — PX: IR US GUIDE BX ASP/DRAIN: IMG2392

## 2024-07-06 LAB — BASIC METABOLIC PANEL WITH GFR
Anion gap: 11 (ref 5–15)
BUN: 6 mg/dL (ref 6–20)
CO2: 22 mmol/L (ref 22–32)
Calcium: 9.1 mg/dL (ref 8.9–10.3)
Chloride: 104 mmol/L (ref 98–111)
Creatinine, Ser: 0.44 mg/dL (ref 0.44–1.00)
GFR, Estimated: >60 mL/min (ref >60)
Glucose, Bld: 89 mg/dL (ref 70–99)
Potassium: 3.8 mmol/L (ref 3.5–5.1)
Sodium: 136 mmol/L (ref 135–145)

## 2024-07-06 LAB — CBC WITH DIFFERENTIAL/PLATELET
Abs Immature Granulocytes: 0.01 K/uL (ref 0.00–0.07)
Basophils Absolute: 0 K/uL (ref 0.0–0.1)
Basophils Relative: 0 %
Eosinophils Absolute: 0 K/uL (ref 0.0–0.5)
Eosinophils Relative: 1 %
HCT: 32.6 % — ABNORMAL LOW (ref 36.0–46.0)
Hemoglobin: 9.5 g/dL — ABNORMAL LOW (ref 12.0–15.0)
Immature Granulocytes: 0 %
Lymphocytes Relative: 36 %
Lymphs Abs: 2.5 K/uL (ref 0.7–4.0)
MCH: 23.5 pg — ABNORMAL LOW (ref 26.0–34.0)
MCHC: 29.1 g/dL — ABNORMAL LOW (ref 30.0–36.0)
MCV: 80.7 fL (ref 80.0–100.0)
Monocytes Absolute: 0.4 K/uL (ref 0.1–1.0)
Monocytes Relative: 6 %
Neutro Abs: 3.9 K/uL (ref 1.7–7.7)
Neutrophils Relative %: 57 %
Platelets: 279 K/uL (ref 150–400)
RBC: 4.04 MIL/uL (ref 3.87–5.11)
RDW: 17.4 % — ABNORMAL HIGH (ref 11.5–15.5)
Smear Review: NORMAL
WBC: 6.8 K/uL (ref 4.0–10.5)
nRBC: 0 % (ref 0.0–0.2)

## 2024-07-06 LAB — MAGNESIUM: Magnesium: 1.8 mg/dL (ref 1.7–2.4)

## 2024-07-06 MED ORDER — MIDAZOLAM HCL 2 MG/2ML IJ SOLN
INTRAMUSCULAR | Status: AC | PRN
  Administered 2024-07-06: 1 mg via INTRAVENOUS

## 2024-07-06 MED ORDER — FENTANYL CITRATE (PF) 100 MCG/2ML IJ SOLN
INTRAMUSCULAR | Status: AC | PRN
  Administered 2024-07-06: 50 ug via INTRAVENOUS

## 2024-07-06 MED ORDER — ALPRAZOLAM 0.5 MG PO TABS
1.0000 mg | ORAL_TABLET | Freq: Once | ORAL | Status: AC
  Administered 2024-07-06: 1 mg via ORAL
  Filled 2024-07-06: qty 2

## 2024-07-06 MED ORDER — LIDOCAINE-EPINEPHRINE 1 %-1:100000 IJ SOLN: 20.0000 mL | Freq: Once | INTRAMUSCULAR | Status: DC

## 2024-07-06 MED ORDER — FENTANYL CITRATE (PF) 100 MCG/2ML IJ SOLN
INTRAMUSCULAR | Status: AC
  Filled 2024-07-06: qty 4

## 2024-07-06 MED ORDER — OXYCODONE-ACETAMINOPHEN 5-325 MG PO TABS
1.0000 | ORAL_TABLET | ORAL | Status: DC | PRN
  Administered 2024-07-06 – 2024-07-11 (×27): 2 via ORAL
  Filled 2024-07-06 (×27): qty 2

## 2024-07-06 MED ORDER — LIDOCAINE-EPINEPHRINE 1 %-1:100000 IJ SOLN
INTRAMUSCULAR | Status: AC
  Filled 2024-07-06: qty 1

## 2024-07-06 MED ORDER — MIDAZOLAM HCL 2 MG/2ML IJ SOLN
INTRAMUSCULAR | Status: AC
  Filled 2024-07-06: qty 4

## 2024-07-06 MED ORDER — MIDAZOLAM HCL 2 MG/2ML IJ SOLN
INTRAMUSCULAR | Status: AC | PRN
  Administered 2024-07-06: 2 mg via INTRAVENOUS
  Administered 2024-07-06: 1 mg via INTRAVENOUS

## 2024-07-06 NOTE — Plan of Care (Signed)

## 2024-07-06 NOTE — Progress Notes (Signed)
 PROGRESS NOTE    April [name]  FMW:[id number] DOB: 09/07/1991 DOA: 07/04/2024 PCP: [facility]    Chief Complaint  Patient presents with   Breast Mass    Brief Narrative:  April [name] is a 33 y.o. 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 Imaging concerning for abscess.    Assessment & Plan:   Principal Problem:   Chest wall abscess Active Problems:   Depression   Soft tissue mass   Microcytic anemia   History of heroin use  #1 left chest wall mass/rule out abscess/concurrent chest wall cellulitis/sepsis rule out - Patient with history of IV drug use reported remission for the past 3 to 4 months noted a left chest wall mass for the past 6 months with worsening erythema and pain. - CT chest abdomen and pelvis done with a 6.4 x 3.7 cm area of inflammation underneath the left 6th, 7th and 8th ribs with small notable area of fluid. -Blood cultures pending. - It is noted that oncology consulted in the ED recommending tissue sampling. - IR consulted for tissue sampling of left chest wall mass/I&D which patient underwent today 07/06/2024 with results pending. - Continue empiric IV vancomycin  and IV cefepime pending culture results. - Continue IV fluids. - Change Percocet to 1 to 2 tablets every 4 hours as needed. - Avoid IV narcotics unless absolutely necessary given patient's history of IV drug use.  #2 acute on chronic microcytic anemia - Likely anemia in menstruating female. - Hemoglobin currently stable at 9.5.  Last hemoglobin noted at 9.7 (06/15/2024). - Patient with no overt bleeding. - Follow H&H. - Transfusion threshold hemoglobin < 7.  3.  Prior history of IV drug abuse with heroin -Patient noted to have a previous episode of  bacteremia and epidural abscess in the past. - Patient reported last use 3 to 4 months ago.  4.  Depression -Not on any medications in the outpatient setting. - Outpatient follow-up with PCP.  DVT prophylaxis: Lovenox Code Status: Full Family Communication: Updated patient and husband at bedside Disposition: Home when clinically improved  Status is: Inpatient Remains inpatient appropriate because: Severity of illness   Consultants:  IR: Dr. Karalee 07/05/2024  Procedures:  CT chest abdomen pelvis 07/04/2024 Chest x-ray 07/06/2024 Left chest wall mass biopsy by IR: Dr.Mugweru 07/06/2024  Antimicrobials:  Anti-infectives (From admission, onward)    Start     Dose/Rate Route Frequency Ordered Stop   07/05/24 1100  vancomycin  (VANCOREADY) IVPB 750 mg/150 mL        750 mg 150 mL/hr over 60 Minutes Intravenous Every 12 hours 07/05/24 0150     07/05/24 0600  ceFEPIme (MAXIPIME) 2 g in sodium chloride  0.9 % 100 mL IVPB        2 g 200 mL/hr over 30 Minutes Intravenous Every 8 hours 07/05/24 0145     07/04/24 2130  vancomycin  (VANCOCIN ) IVPB 1000 mg/200 mL premix        1,000 mg 200 mL/hr over 60 Minutes Intravenous  Once 07/04/24 2125 07/04/24 2359   07/04/24 2130  ceFEPIme (MAXIPIME) 2 g in sodium chloride  0.9 % 100 mL IVPB        2 g 200 mL/hr over 30 Minutes Intravenous  Once 07/04/24 2125 07/04/24 2251  Subjective: Patient just returning from IR biopsy procedure.  Patient with some complaints of left-sided pain underneath the left breast.  Denies any chest pain or shortness of breath.  No abdominal pain.  Husband at bedside.  Patient states received some IV Toradol  overnight yesterday which made her feel funny.  And does not want any more of that Toradol  medication.  Objective: Vitals:   07/06/24 1245 07/06/24 1339 07/06/24 1414 07/06/24 1830  BP: 103/72 103/64 102/66 100/65  Pulse: 86 90 84 77  Resp:  16 16 14   Temp:  98.4 F (36.9 C) 97.9 F (36.6 C) 98.2 F  (36.8 C)  TempSrc:  Oral Oral Oral  SpO2:  100% 100% 97%  Weight:      Height:        Intake/Output Summary (Last 24 hours) at 07/06/2024 1835 Last data filed at 07/06/2024 1400 Gross per 24 hour  Intake 944.44 ml  Output 0 ml  Net 944.44 ml   Filed Weights   07/04/24 1102  Weight: 54.4 kg    Examination:  General exam: Appears calm and comfortable  Respiratory system: Clear to auscultation. Respiratory effort normal. Cardiovascular system: S1 & S2 heard, RRR. No JVD, murmurs, rubs, gallops or clicks. No pedal edema.  Left chest wall with tenderness to palpation. Gastrointestinal system: Abdomen is nondistended, soft and nontender. No organomegaly or masses felt. Normal bowel sounds heard. Central nervous system: Alert and oriented. No focal neurological deficits. Extremities: Symmetric 5 x 5 power. Skin: No rashes, lesions or ulcers Psychiatry: Judgement and insight appear normal. Mood & affect appropriate.     Data Reviewed: I have personally reviewed following labs and imaging studies  CBC: Recent Labs  Lab 07/04/24 1120 07/05/24 0501 07/06/24 1201  WBC 5.6 6.5 6.8  NEUTROABS  --   --  3.9  HGB 9.7* 9.6* 9.5*  HCT 33.2* 31.6* 32.6*  MCV 79.2* 79.0* 80.7  PLT 321 321 279    Basic Metabolic Panel: Recent Labs  Lab 07/04/24 1120 07/05/24 0501 07/06/24 1201 07/06/24 1202  NA 136 136 136  --   K 3.8 4.0 3.8  --   CL 100 102 104  --   CO2 22 25 22   --   GLUCOSE 76 86 89  --   BUN 9 11 6   --   CREATININE 0.56 0.47 0.44  --   CALCIUM 9.7 9.2 9.1  --   MG  --   --   --  1.8    GFR: Estimated Creatinine Clearance: 76.2 mL/min (by C-G formula based on SCr of 0.44 mg/dL).  Liver Function Tests: Recent Labs  Lab 07/04/24 1918  AST 34  ALT 26  ALKPHOS 137*  BILITOT 0.3  PROT 7.9  ALBUMIN 4.3    CBG: No results for input(s): GLUCAP in the last 168 hours.   Recent Results (from the past 240 hours)  Blood culture (routine x 2)     Status: None  (Preliminary result)   Collection Time: 07/04/24 10:10 PM   Specimen: BLOOD  Result Value Ref Range Status   Specimen Description   Final    BLOOD LEFT ANTECUBITAL Performed at [facility], [address] [place], [place] [address]    Special Requests   Final    Blood Culture adequate volume BOTTLES DRAWN AEROBIC AND ANAEROBIC Performed at [facility], [address], [place], [place] [address]    Culture   Final    NO GROWTH 1  DAY Performed at [facility], [address] [place], [place] [address]    Report Status PENDING  Incomplete  Blood culture (routine x 2)     Status: None (Preliminary result)   Collection Time: 07/04/24 10:15 PM   Specimen: BLOOD  Result Value Ref Range Status   Specimen Description   Final    BLOOD BLOOD RIGHT FOREARM Performed at [facility], [address], [place], [place] [address]    Special Requests   Final    Blood Culture adequate volume BOTTLES DRAWN AEROBIC AND ANAEROBIC Performed at [facility], [address], [place], [place] [address]    Culture   Final    NO GROWTH 1 DAY Performed at [facility], [address] [place], [place] [address]    Report Status PENDING  Incomplete  MRSA Next Gen by PCR, Nasal     Status: Abnormal   Collection Time: 07/05/24  9:12 AM   Specimen: Nasal Mucosa; Nasal Swab  Result Value Ref Range Status   MRSA by PCR Next Gen DETECTED (A) NOT DETECTED Final    Comment: RESULT CALLED TO, READ BACK BY AND VERIFIED WITH:  Nadia Viar, T 07/05/2024 1155 (NOTE) The GeneXpert MRSA Assay (FDA approved for NASAL specimens only), is one component of a comprehensive MRSA colonization surveillance program. It is not intended to diagnose MRSA infection nor to guide or monitor treatment for MRSA infections. Test performance is not FDA approved in patients less than 69 years old. Performed at [facility], [address], [place], [place] [address]   Aerobic/Anaerobic Culture w Gram Stain (surgical/deep wound)     Status: None (Preliminary result)   Collection Time: 07/06/24 11:03 AM   Specimen: Tissue  Result Value Ref Range Status   Specimen Description   Final    TISSUE Performed at [facility], [address] [place], [place] [address]    Special Requests   Final    LEFT CHEST Performed at [facility], [address] [place], [place] [address]    Gram Stain   Final    NO WBC SEEN NO ORGANISMS SEEN Performed at [facility], [address] [place], [place] [address]    Culture PENDING  Incomplete   Report Status PENDING  Incomplete         Radiology Studies: IR US  Guide Bx Asp/Drain Result Date: 07/06/2024 INDICATION: [id number] Mass of left chest wall [id number].  History of IV drug use. EXAM: ULTRASOUND-GUIDED LEFT CHEST WALL MASS BIOPSY COMPARISON:  SOFT TISSUE ULTRASOUND, 06/15/2024. CT CAP, 07/04/2024. MEDICATIONS: 1 mg Ativan p.o. ANESTHESIA/SEDATION: Moderate (conscious) sedation was employed during this procedure. A total of Versed 4 mg and Fentanyl  100 mcg was administered intravenously. Moderate Sedation Time: 10 minutes. The patient's level of consciousness and vital signs were monitored continuously by radiology nursing throughout the procedure under my direct supervision. COMPLICATIONS: None immediate. TECHNIQUE: Informed written consent was obtained from the patient after a discussion of the risks, benefits and alternatives to treatment. Questions regarding the procedure were encouraged and answered. Initial ultrasound scanning demonstrated a heterogeneously hypodense subcutaneous area at the inferior LEFT chest wall. An ultrasound image was saved for documentation purposes. The procedure was planned. A timeout was performed prior to the initiation of the procedure. The operative was prepped and draped in the usual sterile fashion,  and a sterile drape was applied covering the operative field. A timeout was  performed prior to the initiation of the procedure. Local anesthesia was provided with 1% lidocaine  with epinephrine. Under direct ultrasound guidance, an 18 gauge core needle device was utilized to obtain to obtain 4 core needle biopsies of the LEFT chest wall lesion. The samples were placed in saline and submitted to pathology. The needle was removed and superficial hemostasis was achieved with manual compression. Post procedure scan was negative for significant hematoma. A dressing was applied. The patient tolerated the procedure well without immediate postprocedural complication. IMPRESSION: Successful ultrasound guided biopsy of a LEFT chest wall mass-like area. Thom Hall, MD [facility] Electronically Signed   By: Thom Hall M.D.   On: 07/06/2024 14:09   DG Chest 1 View Result Date: 07/06/2024 CLINICAL DATA:  Status post chest wall mass biopsy. EXAM: DG CHEST 1V COMPARISON:  CT chest 07/04/2024 and chest radiograph 01/29/2018. FINDINGS: Trachea is midline. Heart size normal. Lungs are clear. No pleural fluid. No pneumothorax. Soft tissue mass associated with the left seventh anterior rib, better seen on CT 07/04/2024. IMPRESSION: 1. No acute findings.  No pneumothorax. 2. Soft tissue mass associated with the left anterior seventh rib, better seen on CT 07/04/2024. Electronically Signed   By: Newell Eke M.D.   On: 07/06/2024 12:06   CT CHEST ABDOMEN PELVIS W CONTRAST Result Date: 07/04/2024 CLINICAL DATA:  Palpable abnormality in the left anterior chest wall below the left breast EXAM: CT CHEST, ABDOMEN, AND PELVIS WITH CONTRAST TECHNIQUE: Multidetector CT imaging of the chest, abdomen and pelvis was performed following the standard protocol during bolus administration of intravenous contrast. RADIATION DOSE REDUCTION: This exam was performed according to the  departmental dose-optimization program which includes automated exposure control, adjustment of the mA and/or kV according to patient size and/or use of iterative reconstruction technique. CONTRAST:  100mL OMNIPAQUE  IOHEXOL  300 MG/ML  SOLN COMPARISON:  None Available. FINDINGS: CT CHEST FINDINGS Cardiovascular: Heart is not significantly enlarged in size. Thoracic aorta and pulmonary artery as visualized are within normal limits. No coronary calcifications are seen. Mediastinum/Nodes: Thoracic inlet is within normal limits. No hilar or mediastinal adenopathy is noted. Some residual thymus tissue is noted in the anterior mediastinum. The esophagus as visualized is within normal limits. Lungs/Pleura: Lungs are well aerated bilaterally. No focal infiltrate or sizable effusion is noted. Musculoskeletal: No acute rib fracture is noted. No compression deformity is seen. In the anterior left chest wall surrounding the costal cartilage of the sixth, seventh and eighth ribs on the left there is an irregular approximately 6.4 by 3.7 cm area of inflammation with small areas of fluid within. There is an area of fluid measuring up to 2.3 x 1.2 cm best seen on image number 44 this would correspond to that seen on prior ultrasound several weeks ago. Some mass effect upon the adjacent liver is noted. No encroachment into the liver is seen. Infection is felt to be primary consideration with neoplasm being less likely although not completely excluded on the basis of this exam. Remote trauma with localized hematoma could not be excluded although no traumatic history is given. CT ABDOMEN PELVIS FINDINGS Hepatobiliary: No focal liver abnormality is seen. No gallstones, gallbladder wall thickening, or biliary dilatation. Pancreas: Unremarkable. No pancreatic ductal dilatation or surrounding inflammatory changes. Spleen: Normal in size without focal abnormality. Adrenals/Urinary Tract: Adrenal glands are within normal limits. Kidneys  demonstrate a normal enhancement pattern bilaterally. No renal calculi or obstructive changes are seen. The bladder is partially distended. Stomach/Bowel:  Scattered fecal material is noted throughout the colon. No obstructive or inflammatory changes are noted. The appendix is within normal limits. Small bowel and stomach are unremarkable. Vascular/Lymphatic: No significant vascular findings are present. No enlarged abdominal or pelvic lymph nodes. Reproductive: Uterus is unremarkable. Simple appearing cyst is noted within the right ovary measuring 3.1 cm. Other: No abdominal wall hernia or abnormality. No abdominopelvic ascites. Musculoskeletal: No acute bony abnormality in the abdomen and pelvis. IMPRESSION: Changes about the costal cartilage of the left sixth through eighth ribs as described. Infection is again felt to be most likely. Outpatient tissue sampling and or fluid sampling is recommended for further evaluation. Right ovarian simple-appearing cyst measuring 3.1 cm. No follow-up imaging is recommended. Reference: JACR 2020 Feb;17(2):248-254 Electronically Signed   By: Oneil Devonshire M.D.   On: 07/04/2024 20:35        Scheduled Meds:  Chlorhexidine  Gluconate Cloth  6 each Topical Daily   [START ON 07/07/2024] enoxaparin (LOVENOX) injection  40 mg Subcutaneous Q24H   lidocaine -EPINEPHrine  20 mL Intradermal Once   mupirocin  ointment  1 Application Nasal BID   Continuous Infusions:  ceFEPime (MAXIPIME) IV Stopped (07/06/24 1435)   vancomycin  Stopped (07/06/24 1117)     LOS: 2 days    Time spent: 40 minutes    Toribio Hummer, MD [facility]   To contact the attending provider between 7A-7P or the covering provider during after hours 7P-7A, please log into the web site [email or web address] and access using universal [place] password for that web site. If you do not have the password, please call the hospital operator.  07/06/2024, 6:35 PM

## 2024-07-06 NOTE — Sedation Documentation (Signed)
 RN Arlayne Liggins pulled 4 mg Versed and 200 mcg Fentanyl  in IR med room pysix. Pt. Received 4 mg Versed and 100 mcg Fentanyl  throughout the procedure. Rn Cristobal Advani returned 100 mcg Fentanyl  into med room pysix.

## 2024-07-06 NOTE — Procedures (Signed)
 Vascular and Interventional Radiology Procedure Note  Patient: April [name] DOB: 1990/11/30 Medical Record Number: [id number] Note Date/Time: 07/06/24 11:03 AM   Performing Physician: Thom Hall, MD Assistant(s): None  Diagnosis: L chest wall mass. Hx IVDU   Procedure: LEFT CHEST WALL MASS BIOPSY  Anesthesia: Conscious Sedation Complications: None Estimated Blood Loss: Minimal Specimens: Sent for Gram Stain, Aerobe Culture, Anerobe Culture, and Pathology  Findings:  Successful Ultrasound-guided biopsy of a L chest wall mass. A total of 3 samples were obtained. Hemostasis of the tract was achieved using Manual Pressure.  Plan: Bed rest for 1 hours.  See detailed procedure note with images in PACS. The patient tolerated the procedure well without incident or complication and was returned to Recovery in stable condition.    Thom Hall, MD [facility]ists [facility]   Pager. [phone] Clinic. [phone]

## 2024-07-06 NOTE — Plan of Care (Signed)

## 2024-07-07 DIAGNOSIS — F32A Depression, unspecified: Secondary | ICD-10-CM | POA: Diagnosis not present

## 2024-07-07 DIAGNOSIS — D509 Iron deficiency anemia, unspecified: Secondary | ICD-10-CM | POA: Diagnosis not present

## 2024-07-07 DIAGNOSIS — M7989 Other specified soft tissue disorders: Secondary | ICD-10-CM | POA: Diagnosis not present

## 2024-07-07 DIAGNOSIS — L02213 Cutaneous abscess of chest wall: Secondary | ICD-10-CM | POA: Diagnosis not present

## 2024-07-07 LAB — BASIC METABOLIC PANEL WITH GFR
Anion gap: 10 (ref 5–15)
BUN: 7 mg/dL (ref 6–20)
CO2: 22 mmol/L (ref 22–32)
Calcium: 9.4 mg/dL (ref 8.9–10.3)
Chloride: 103 mmol/L (ref 98–111)
Creatinine, Ser: 0.5 mg/dL (ref 0.44–1.00)
GFR, Estimated: >60 mL/min (ref >60)
Glucose, Bld: 90 mg/dL (ref 70–99)
Potassium: 4.2 mmol/L (ref 3.5–5.1)
Sodium: 136 mmol/L (ref 135–145)

## 2024-07-07 LAB — CBC WITH DIFFERENTIAL/PLATELET
Abs Immature Granulocytes: 0.01 K/uL (ref 0.00–0.07)
Basophils Absolute: 0 K/uL (ref 0.0–0.1)
Basophils Relative: 1 %
Eosinophils Absolute: 0 K/uL (ref 0.0–0.5)
Eosinophils Relative: 1 %
HCT: 33 % — ABNORMAL LOW (ref 36.0–46.0)
Hemoglobin: 9.6 g/dL — ABNORMAL LOW (ref 12.0–15.0)
Immature Granulocytes: 0 %
Lymphocytes Relative: 29 %
Lymphs Abs: 1.8 K/uL (ref 0.7–4.0)
MCH: 23.6 pg — ABNORMAL LOW (ref 26.0–34.0)
MCHC: 29.1 g/dL — ABNORMAL LOW (ref 30.0–36.0)
MCV: 81.1 fL (ref 80.0–100.0)
Monocytes Absolute: 0.4 K/uL (ref 0.1–1.0)
Monocytes Relative: 7 %
Neutro Abs: 4 K/uL (ref 1.7–7.7)
Neutrophils Relative %: 62 %
Platelets: 300 K/uL (ref 150–400)
RBC: 4.07 MIL/uL (ref 3.87–5.11)
RDW: 17.3 % — ABNORMAL HIGH (ref 11.5–15.5)
WBC: 6.3 K/uL (ref 4.0–10.5)
nRBC: 0 % (ref 0.0–0.2)

## 2024-07-07 LAB — SURGICAL PATHOLOGY

## 2024-07-07 MED ORDER — TRAMADOL HCL 50 MG PO TABS
100.0000 mg | ORAL_TABLET | Freq: Three times a day (TID) | ORAL | Status: DC
Start: 2024-07-07 — End: 2024-07-09
  Filled 2024-07-07 (×2): qty 2

## 2024-07-07 MED ORDER — MELATONIN 5 MG PO TABS
5.0000 mg | ORAL_TABLET | Freq: Every evening | ORAL | Status: DC | PRN
Start: 2024-07-07 — End: 2024-07-11
  Administered 2024-07-07: 5 mg via ORAL
  Filled 2024-07-07: qty 1

## 2024-07-07 MED ORDER — TRAMADOL HCL 50 MG PO TABS: 100.0000 mg | ORAL_TABLET | Freq: Four times a day (QID) | ORAL | Status: DC | PRN

## 2024-07-07 NOTE — Progress Notes (Signed)
 PROGRESS NOTE    April [name]  FMW:[id number] DOB: 29-Apr-1991 DOA: 07/04/2024 PCP: [facility]    Chief Complaint  Patient presents with   Breast Mass    Brief Narrative:  April [name] is a 33 y.o. 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 Imaging concerning for abscess.    Assessment & Plan:   Principal Problem:   Chest wall abscess Active Problems:   Depression   Soft tissue mass   Microcytic anemia   History of heroin use  #1 left chest wall mass/rule out abscess/concurrent chest wall cellulitis/sepsis rule out - Patient with history of IV drug use reported remission for the past 3 to 4 months noted a left chest wall mass for the past 6 months with worsening erythema and pain. - CT chest abdomen and pelvis done with a 6.4 x 3.7 cm area of inflammation underneath the left 6th, 7th and 8th ribs with small notable area of fluid. -Blood cultures pending. - It is noted that oncology consulted in the ED recommending tissue sampling. - IR consulted for tissue sampling of left chest wall mass/I&D which was done on, 07/06/2024 with results pending. -Aerobic and anaerobic cultures done of left chest wall mass with no organisms in the past 24 hours. - Continue empiric IV cefepime pending culture results. -Discontinue IV vancomycin . - Discontinue IV fluids. - Continue Percocet 5/325 mg 1 to 2 tablets every 4 hours as needed. - Avoid IV narcotics unless absolutely necessary given patient's history of IV drug use.  #2 acute on chronic microcytic anemia - Likely anemia in menstruating female. - Hemoglobin currently stable at 9.6.  Last hemoglobin noted at 9.7 (06/15/2024). - Patient with no overt bleeding. - Follow H&H. - Transfusion threshold  hemoglobin < 7.  3.  Prior history of IV drug abuse with heroin -Patient noted to have a previous episode of bacteremia and epidural abscess in the past. - Patient reported last use 3 to 4 months ago.  4.  Depression -Not on any medications in the outpatient setting. - Outpatient follow-up with PCP.  DVT prophylaxis: Lovenox Code Status: Full Family Communication: Updated patient and husband at bedside Disposition: Home when clinically improved  Status is: Inpatient Remains inpatient appropriate because: Severity of illness   Consultants:  IR: Dr. Karalee 07/05/2024  Procedures:  CT chest abdomen pelvis 07/04/2024 Chest x-ray 07/06/2024 Left chest wall mass biopsy by IR: Dr.Mugweru 07/06/2024  Antimicrobials:  Anti-infectives (From admission, onward)    Start     Dose/Rate Route Frequency Ordered Stop   07/05/24 1100  vancomycin  (VANCOREADY) IVPB 750 mg/150 mL        750 mg 150 mL/hr over 60 Minutes Intravenous Every 12 hours 07/05/24 0150     07/05/24 0600  ceFEPIme (MAXIPIME) 2 g in sodium chloride  0.9 % 100 mL IVPB        2 g 200 mL/hr over 30 Minutes Intravenous Every 8 hours 07/05/24 0145     07/04/24 2130  vancomycin  (VANCOCIN ) IVPB 1000 mg/200 mL premix        1,000 mg 200 mL/hr over 60 Minutes Intravenous  Once 07/04/24 2125 07/04/24 2359   07/04/24 2130  ceFEPIme (MAXIPIME) 2 g in sodium chloride  0.9 % 100 mL IVPB        2 g 200  mL/hr over 30 Minutes Intravenous  Once 07/04/24 2125 07/04/24 2251         Subjective: Patient laying in bed.  Patient still with complaints of left chest wall pain and requesting pain medication.  No shortness of breath.  No abdominal pain.  Husband at bedside.    Objective: Vitals:   07/06/24 2038 07/07/24 0128 07/07/24 0634 07/07/24 1013  BP: 106/62 108/69 98/65 98/63   Pulse: 70 83 75 66  Resp: 16 16 16 14   Temp: 98.2 F (36.8 C) 97.7 F (36.5 C) 97.7 F (36.5 C) 98.2 F (36.8 C)  TempSrc: Oral Oral Oral Oral  SpO2:  100% 100% 100% 100%  Weight:      Height:        Intake/Output Summary (Last 24 hours) at 07/07/2024 1330 Last data filed at 07/07/2024 1000 Gross per 24 hour  Intake 2845.25 ml  Output 0 ml  Net 2845.25 ml   Filed Weights   07/04/24 1102  Weight: 54.4 kg    Examination:  General exam: NAD. Respiratory system: Lungs clear to auscultation bilaterally.  No wheezes, no crackles, no rhonchi.  Fair air movement.  Speaking in full sentences.   Cardiovascular system: Regular rate rhythm no murmurs rubs or gallops.  No JVD.  No lower extremity edema.  Left chest wall tender to palpation.  Gastrointestinal system: Abdomen is soft, nontender, nondistended, positive bowel sounds.  No rebound.  No guarding.  Central nervous system: Alert and oriented. No focal neurological deficits. Extremities: Symmetric 5 x 5 power. Skin: No rashes, lesions or ulcers Psychiatry: Judgement and insight appear normal. Mood & affect appropriate.     Data Reviewed: I have personally reviewed following labs and imaging studies  CBC: Recent Labs  Lab 07/04/24 1120 07/05/24 0501 07/06/24 1201 07/07/24 0455  WBC 5.6 6.5 6.8 6.3  NEUTROABS  --   --  3.9 4.0  HGB 9.7* 9.6* 9.5* 9.6*  HCT 33.2* 31.6* 32.6* 33.0*  MCV 79.2* 79.0* 80.7 81.1  PLT 321 321 279 300    Basic Metabolic Panel: Recent Labs  Lab 07/04/24 1120 07/05/24 0501 07/06/24 1201 07/06/24 1202 07/07/24 0455  NA 136 136 136  --  136  K 3.8 4.0 3.8  --  4.2  CL 100 102 104  --  103  CO2 22 25 22   --  22  GLUCOSE 76 86 89  --  90  BUN 9 11 6   --  7  CREATININE 0.56 0.47 0.44  --  0.50  CALCIUM 9.7 9.2 9.1  --  9.4  MG  --   --   --  1.8  --     GFR: Estimated Creatinine Clearance: 76.2 mL/min (by C-G formula based on SCr of 0.5 mg/dL).  Liver Function Tests: Recent Labs  Lab 07/04/24 1918  AST 34  ALT 26  ALKPHOS 137*  BILITOT 0.3  PROT 7.9  ALBUMIN 4.3    CBG: No results for input(s): GLUCAP in the last 168  hours.   Recent Results (from the past 240 hours)  Blood culture (routine x 2)     Status: None (Preliminary result)   Collection Time: 07/04/24 10:10 PM   Specimen: BLOOD  Result Value Ref Range Status   Specimen Description   Final    BLOOD LEFT ANTECUBITAL Performed at [facility], [address], [place], [place] [address]    Special Requests   Final    Blood Culture adequate volume BOTTLES DRAWN  AEROBIC AND ANAEROBIC Performed at [facility], [address] [place], [place] [address]    Culture   Final    NO GROWTH 2 DAYS Performed at [facility], [address], [place], [place] [address]    Report Status PENDING  Incomplete  Blood culture (routine x 2)     Status: None (Preliminary result)   Collection Time: 07/04/24 10:15 PM   Specimen: BLOOD  Result Value Ref Range Status   Specimen Description   Final    BLOOD BLOOD RIGHT FOREARM Performed at [facility], [address], [place], [place] [address]    Special Requests   Final    Blood Culture adequate volume BOTTLES DRAWN AEROBIC AND ANAEROBIC Performed at [facility], [address] [place], [place] [address]    Culture   Final    NO GROWTH 2 DAYS Performed at [facility], [address], [place], [place] [address]    Report Status PENDING  Incomplete  MRSA Next Gen by PCR, Nasal     Status: Abnormal   Collection Time: 07/05/24  9:12 AM   Specimen: Nasal Mucosa; Nasal Swab  Result Value Ref Range Status   MRSA by PCR Next Gen DETECTED (A) NOT DETECTED Final    Comment: RESULT CALLED TO, READ BACK BY AND VERIFIED WITH:  Samentha Perham, T 07/05/2024 1155 (NOTE) The GeneXpert MRSA Assay (FDA approved for NASAL specimens only), is one component of a comprehensive MRSA colonization surveillance program. It is not intended to diagnose MRSA infection nor to guide or monitor treatment for MRSA infections. Test performance is  not FDA approved in patients less than 40 years old. Performed at [facility], [address] [place], [place] [address]   Aerobic/Anaerobic Culture w Gram Stain (surgical/deep wound)     Status: None (Preliminary result)   Collection Time: 07/06/24 11:03 AM   Specimen: Tissue  Result Value Ref Range Status   Specimen Description   Final    TISSUE Performed at [facility], [address] [place], [place] [address]    Special Requests   Final    LEFT CHEST Performed at [facility], [address], [place], [place] [address]    Gram Stain NO WBC SEEN NO ORGANISMS SEEN   Final   Culture   Final    NO GROWTH < 24 HOURS Performed at [facility], [address] [place], [place] [address]    Report Status PENDING  Incomplete         Radiology Studies: IR US  Guide Bx Asp/Drain Result Date: 07/06/2024 INDICATION: [id number] Mass of left chest wall [id number].  History of IV drug use. EXAM: ULTRASOUND-GUIDED LEFT CHEST WALL MASS BIOPSY COMPARISON:  SOFT TISSUE ULTRASOUND, 06/15/2024. CT CAP, 07/04/2024. MEDICATIONS: 1 mg Ativan p.o. ANESTHESIA/SEDATION: Moderate (conscious) sedation was employed during this procedure. A total of Versed 4 mg and Fentanyl  100 mcg was administered intravenously. Moderate Sedation Time: 10 minutes. The patient's level of consciousness and vital signs were monitored continuously by radiology nursing throughout the procedure under my direct supervision. COMPLICATIONS: None immediate. TECHNIQUE: Informed written consent was obtained from the patient after a discussion of the risks, benefits and alternatives to treatment. Questions regarding the procedure were encouraged and answered. Initial ultrasound scanning demonstrated a heterogeneously hypodense subcutaneous area at the inferior LEFT chest wall. An ultrasound image was saved for documentation purposes. The procedure was planned. A timeout  was  performed prior to the initiation of the procedure. The operative was prepped and draped in the usual sterile fashion, and a sterile drape was applied covering the operative field. A timeout was performed prior to the initiation of the procedure. Local anesthesia was provided with 1% lidocaine  with epinephrine. Under direct ultrasound guidance, an 18 gauge core needle device was utilized to obtain to obtain 4 core needle biopsies of the LEFT chest wall lesion. The samples were placed in saline and submitted to pathology. The needle was removed and superficial hemostasis was achieved with manual compression. Post procedure scan was negative for significant hematoma. A dressing was applied. The patient tolerated the procedure well without immediate postprocedural complication. IMPRESSION: Successful ultrasound guided biopsy of a LEFT chest wall mass-like area. Thom Hall, MD [facility] Electronically Signed   By: Thom Hall M.D.   On: 07/06/2024 14:09   DG Chest 1 View Result Date: 07/06/2024 CLINICAL DATA:  Status post chest wall mass biopsy. EXAM: DG CHEST 1V COMPARISON:  CT chest 07/04/2024 and chest radiograph 01/29/2018. FINDINGS: Trachea is midline. Heart size normal. Lungs are clear. No pleural fluid. No pneumothorax. Soft tissue mass associated with the left seventh anterior rib, better seen on CT 07/04/2024. IMPRESSION: 1. No acute findings.  No pneumothorax. 2. Soft tissue mass associated with the left anterior seventh rib, better seen on CT 07/04/2024. Electronically Signed   By: Newell Eke M.D.   On: 07/06/2024 12:06        Scheduled Meds:  Chlorhexidine  Gluconate Cloth  6 each Topical Daily   enoxaparin (LOVENOX) injection  40 mg Subcutaneous Q24H   lidocaine -EPINEPHrine  20 mL Intradermal Once   mupirocin  ointment  1 Application Nasal BID   Continuous Infusions:  ceFEPime (MAXIPIME) IV 2 g (07/07/24 1330)   vancomycin   750 mg (07/07/24 1104)     LOS: 3 days    Time spent: 35 minutes    Toribio Hummer, MD [facility]   To contact the attending provider between 7A-7P or the covering provider during after hours 7P-7A, please log into the web site [email or web address] and access using universal [place] password for that web site. If you do not have the password, please call the hospital operator.  07/07/2024, 1:30 PM

## 2024-07-07 NOTE — Plan of Care (Signed)
 ?  Problem: Clinical Measurements: ?Goal: Will remain free from infection ?Outcome: Progressing ?  ?Problem: Clinical Measurements: ?Goal: Diagnostic test results will improve ?Outcome: Progressing ?  ?

## 2024-07-08 DIAGNOSIS — D509 Iron deficiency anemia, unspecified: Secondary | ICD-10-CM | POA: Diagnosis not present

## 2024-07-08 DIAGNOSIS — M7989 Other specified soft tissue disorders: Secondary | ICD-10-CM | POA: Diagnosis not present

## 2024-07-08 DIAGNOSIS — L02213 Cutaneous abscess of chest wall: Secondary | ICD-10-CM | POA: Diagnosis not present

## 2024-07-08 DIAGNOSIS — F32A Depression, unspecified: Secondary | ICD-10-CM | POA: Diagnosis not present

## 2024-07-08 LAB — CBC
HCT: 32.3 % — ABNORMAL LOW (ref 36.0–46.0)
Hemoglobin: 9.5 g/dL — ABNORMAL LOW (ref 12.0–15.0)
MCH: 23.5 pg — ABNORMAL LOW (ref 26.0–34.0)
MCHC: 29.4 g/dL — ABNORMAL LOW (ref 30.0–36.0)
MCV: 79.8 fL — ABNORMAL LOW (ref 80.0–100.0)
Platelets: 335 K/uL (ref 150–400)
RBC: 4.05 MIL/uL (ref 3.87–5.11)
RDW: 17.4 % — ABNORMAL HIGH (ref 11.5–15.5)
WBC: 5.8 K/uL (ref 4.0–10.5)
nRBC: 0 % (ref 0.0–0.2)

## 2024-07-08 LAB — BASIC METABOLIC PANEL WITH GFR
Anion gap: 9 (ref 5–15)
BUN: 9 mg/dL (ref 6–20)
CO2: 22 mmol/L (ref 22–32)
Calcium: 9.6 mg/dL (ref 8.9–10.3)
Chloride: 103 mmol/L (ref 98–111)
Creatinine, Ser: 0.49 mg/dL (ref 0.44–1.00)
GFR, Estimated: >60 mL/min (ref >60)
Glucose, Bld: 96 mg/dL (ref 70–99)
Potassium: 3.9 mmol/L (ref 3.5–5.1)
Sodium: 135 mmol/L (ref 135–145)

## 2024-07-08 MED ORDER — VANCOMYCIN HCL 750 MG/150ML IV SOLN
750.0000 mg | Freq: Two times a day (BID) | INTRAVENOUS | Status: DC
  Administered 2024-07-08 – 2024-07-11 (×7): 750 mg via INTRAVENOUS
  Filled 2024-07-08 (×7): qty 150

## 2024-07-08 MED ORDER — LINEZOLID 600 MG PO TABS
600.0000 mg | ORAL_TABLET | Freq: Two times a day (BID) | ORAL | Status: DC
  Administered 2024-07-08: 600 mg via ORAL
  Filled 2024-07-08: qty 1

## 2024-07-08 NOTE — Progress Notes (Signed)
 PROGRESS NOTE    CHOLE DRIVER  FMW:[id number] DOB: 09-Dec-1990 DOA: 07/04/2024 PCP: [facility]    Chief Complaint  Patient presents with   Breast Mass    Brief Narrative:  DARRELLE BARRELL is a 33 y.o. 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 Imaging concerning for abscess.    Assessment & Plan:   Principal Problem:   Chest wall abscess Active Problems:   Depression   Soft tissue mass   Microcytic anemia   History of heroin use  #1 left chest wall mass/rule out abscess/concurrent chest wall cellulitis/sepsis rule out - Patient with history of IV drug use reported remission for the past 3 to 4 months noted a left chest wall mass for the past 6 months with worsening erythema and pain. - CT chest abdomen and pelvis done with a 6.4 x 3.7 cm area of inflammation underneath the left 6th, 7th and 8th ribs with small notable area of fluid. -Blood cultures pending. - It is noted that oncology consulted in the ED recommending tissue sampling. - IR consulted for tissue sampling of left chest wall mass/I&D which was done on, 07/06/2024 with aerobic and anaerobic cultures pending with no growth to date.  Blood cultures with no growth to date.  Nasal MRSA PCR positive.  -Biopsy done of left chest wall mass with fibrosis and inflammation and likely favoring a reactive process. -Was initially on IV vancomycin  which was discontinued and patient started on linezolid this morning.  -On IV cefepime. -Due to patient's history of prior IV drug use, MRSA infections in the past ID consulted for further evaluation and management. -Patient seen in consultation by ID who suspect likely related to her prior MRSA infection during August admission when patient left AMA and  transition to p.o. linezolid. - Per ID patient with also a prior MRSA bacteremia sensitive to daptomycin, vancomycin  and linezolid.  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 - ID recommending switching linezolid back to IV vancomycin  pending culture results and discontinuing cefepime. - ID has ordered HBV and HCV serology. - ID following and appreciate their input and recommendations. - Continue Percocet 5/325 mg 1 to 2 tablets every 4 hours as needed. -Continue scheduled Ultram . - Avoid IV narcotics unless absolutely necessary given patient's history of IV drug use.  #2 acute on chronic microcytic anemia - Likely anemia in menstruating female. - Hemoglobin currently stable at 9.5.  Last hemoglobin noted at 9.7 (06/15/2024). - Patient with no overt bleeding. - Follow H&H. - Transfusion threshold hemoglobin < 7.  3.  Prior history of IV drug abuse with heroin -Patient noted to have a previous episode of bacteremia and epidural abscess in the past. - Patient reported last use 3 to 4 months ago.  4.  Depression -Not on any medications in the outpatient setting. - Outpatient follow-up with PCP.  DVT prophylaxis: Lovenox>>> SCDs.  Patient noted to be refusing Lovenox. Code Status: Full Family Communication: Updated patient and husband at bedside Disposition: Home when clinically improved  Status is: Inpatient Remains inpatient appropriate because: Severity of illness   Consultants:  IR: Dr. Karalee 07/05/2024 ID: Dr.Manandhar 07/08/2024  Procedures:  CT chest abdomen pelvis 07/04/2024 Chest x-ray 07/06/2024 Left chest wall mass biopsy by IR: Dr.Mugweru 07/06/2024  Antimicrobials:  Anti-infectives (From admission, onward)    Start     Dose/Rate Route Frequency Ordered Stop   07/08/24 1100  vancomycin  (VANCOREADY) IVPB 750 mg/150 mL        750 mg 150 mL/hr over 60 Minutes Intravenous Every 12 hours 07/08/24 1011     07/08/24 1000   linezolid (ZYVOX) tablet 600 mg  Status:  Discontinued        600 mg Oral Every 12 hours 07/08/24 0856 07/08/24 1011   07/05/24 1100  vancomycin  (VANCOREADY) IVPB 750 mg/150 mL  Status:  Discontinued        750 mg 150 mL/hr over 60 Minutes Intravenous Every 12 hours 07/05/24 0150 07/07/24 1903   07/05/24 0600  ceFEPIme (MAXIPIME) 2 g in sodium chloride  0.9 % 100 mL IVPB  Status:  Discontinued        2 g 200 mL/hr over 30 Minutes Intravenous Every 8 hours 07/05/24 0145 07/08/24 0951   07/04/24 2130  vancomycin  (VANCOCIN ) IVPB 1000 mg/200 mL premix        1,000 mg 200 mL/hr over 60 Minutes Intravenous  Once 07/04/24 2125 07/04/24 2359   07/04/24 2130  ceFEPIme (MAXIPIME) 2 g in sodium chloride  0.9 % 100 mL IVPB        2 g 200 mL/hr over 30 Minutes Intravenous  Once 07/04/24 2125 07/04/24 2251         Subjective: Patient laying in bed.  Still with complaints of left-sided chest wall pain.  Some nausea but no emesis.  Patient does endorse decreased appetite.  Denies any abdominal pain.    Objective: Vitals:   07/07/24 1353 07/07/24 2027 07/08/24 0454 07/08/24 1351  BP: (!) 100/56 105/65 100/65 (!) 102/58  Pulse: 72 (!) 57 (!) 55 79  Resp: 16 16 16 16   Temp: 98 F (36.7 C) 98.1 F (36.7 C) 98 F (36.7 C) 98 F (36.7 C)  TempSrc: Oral     SpO2: 100% 100% 100% 100%  Weight:      Height:        Intake/Output Summary (Last 24 hours) at 07/08/2024 1418 Last data filed at 07/08/2024 1009 Gross per 24 hour  Intake 720 ml  Output --  Net 720 ml   Filed Weights   07/04/24 1102  Weight: 54.4 kg    Examination:  General exam: NAD. Respiratory system: CTAB.  No wheezes, no crackles, no rhonchi.  Fair air movement.  Speaking in full sentences.  Cardiovascular system: RRR no murmurs rubs or gallops.  No JVD.  No pitting lower extremity edema.  Left chest wall tender to palpation.  Gastrointestinal system: Abdomen is soft, nontender, nondistended, positive bowel sounds.  No  rebound.  No guarding.  Central nervous system: Alert and oriented. No focal neurological deficits. Extremities: Symmetric 5 x 5 power. Skin: No rashes, lesions or ulcers Psychiatry: Judgement and insight appear normal. Mood & affect appropriate.     Data Reviewed: I have personally reviewed following labs and imaging studies  CBC: Recent Labs  Lab 07/04/24 1120 07/05/24 0501 07/06/24 1201 07/07/24 0455 07/08/24 0435  WBC 5.6 6.5 6.8 6.3 5.8  NEUTROABS  --   --  3.9 4.0  --   HGB 9.7* 9.6* 9.5* 9.6* 9.5*  HCT 33.2* 31.6* 32.6* 33.0* 32.3*  MCV 79.2* 79.0* 80.7 81.1 79.8*  PLT 321 321 279 300 335    Basic Metabolic Panel: Recent Labs  Lab 07/04/24 1120 07/05/24 0501 07/06/24 1201 07/06/24 1202 07/07/24 0455 07/08/24 0435  NA 136 136 136  --  136 135  K 3.8 4.0 3.8  --  4.2 3.9  CL 100 102 104  --  103 103  CO2 22 25 22   --  22 22  GLUCOSE 76 86 89  --  90 96  BUN 9 11 6   --  7 9  CREATININE 0.56 0.47 0.44  --  0.50 0.49  CALCIUM 9.7 9.2 9.1  --  9.4 9.6  MG  --   --   --  1.8  --   --     GFR: Estimated Creatinine Clearance: 76.2 mL/min (by C-G formula based on SCr of 0.49 mg/dL).  Liver Function Tests: Recent Labs  Lab 07/04/24 1918  AST 34  ALT 26  ALKPHOS 137*  BILITOT 0.3  PROT 7.9  ALBUMIN 4.3    CBG: No results for input(s): GLUCAP in the last 168 hours.   Recent Results (from the past 240 hours)  Blood culture (routine x 2)     Status: None (Preliminary result)   Collection Time: 07/04/24 10:10 PM   Specimen: BLOOD  Result Value Ref Range Status   Specimen Description   Final    BLOOD LEFT ANTECUBITAL Performed at [facility], [address], [place], [place] [address]    Special Requests   Final    Blood Culture adequate volume BOTTLES DRAWN AEROBIC AND ANAEROBIC Performed at [facility], [address], [place], [place] [address]    Culture   Final    NO GROWTH 3 DAYS Performed at [facility], [address] [place], [place] [address]    Report Status PENDING  Incomplete  Blood culture (routine x 2)     Status: None (Preliminary result)   Collection Time: 07/04/24 10:15 PM   Specimen: BLOOD  Result Value Ref Range Status   Specimen Description   Final    BLOOD BLOOD RIGHT FOREARM Performed at [facility], [address] [place], [place] [address]    Special Requests   Final    Blood Culture adequate volume BOTTLES DRAWN AEROBIC AND ANAEROBIC Performed at [facility], [address], [place], [place] [address]    Culture   Final    NO GROWTH 3 DAYS Performed at [facility], [address], [place], [place] [address]    Report Status PENDING  Incomplete  MRSA Next Gen by PCR, Nasal     Status: Abnormal   Collection Time: 07/05/24  9:12 AM   Specimen: Nasal Mucosa; Nasal Swab  Result Value Ref Range Status   MRSA by PCR Next Gen DETECTED (A) NOT DETECTED Final    Comment: RESULT CALLED TO, READ BACK BY AND VERIFIED WITH:  Cabella Kimm, T 07/05/2024 1155 (NOTE) The GeneXpert MRSA Assay (FDA approved for NASAL specimens only), is one component of a comprehensive MRSA colonization surveillance program. It is not intended to diagnose MRSA infection nor to guide or monitor treatment for MRSA infections. Test performance is not FDA approved in patients less than 69 years old. Performed at [facility], [address], [place], [place] [address]   Aerobic/Anaerobic Culture w Gram Stain (surgical/deep wound)     Status: None (Preliminary result)   Collection Time: 07/06/24 11:03 AM   Specimen: Tissue  Result Value Ref Range Status   Specimen Description   Final    TISSUE Performed at [facility], [address]  [place], [place] [address]    Special Requests   Final    LEFT CHEST Performed at [facility], [address], [place], [place] [address]     Gram Stain NO WBC SEEN NO ORGANISMS SEEN   Final   Culture   Final    NO GROWTH 2 DAYS NO ANAEROBES ISOLATED; CULTURE IN PROGRESS FOR 5 DAYS Performed at [facility], [address], [place], [place] [address]    Report Status PENDING  Incomplete         Radiology Studies: No results found.       Scheduled Meds:  Chlorhexidine  Gluconate Cloth  6 each Topical Daily   enoxaparin (LOVENOX) injection  40 mg Subcutaneous Q24H   mupirocin  ointment  1 Application Nasal BID   traMADol   100 mg Oral TID   Continuous Infusions:  vancomycin  750 mg (07/08/24 1219)     LOS: 4 days    Time spent: 35 minutes    Toribio Hummer, MD [facility]   To contact the attending provider between 7A-7P or the covering provider during after hours 7P-7A, please log into the web site [email or web address] and access using universal [place] password for that web site. If you do not have the password, please call the hospital operator.  07/08/2024, 2:18 PM

## 2024-07-08 NOTE — Plan of Care (Signed)
   Problem: Coping: Goal: Level of anxiety will decrease Outcome: Progressing   Problem: Pain Managment: Goal: General experience of comfort will improve and/or be controlled Outcome: Progressing

## 2024-07-08 NOTE — Progress Notes (Signed)
 Pharmacy Antibiotic Note  April [name] is a 33 y.o. female admitted on 07/04/2024 with chest wall wound infection/cellulitis now s/p biopsy 10/8.  PMH significant for IVDU with h/o MRSA bacteremia and epidural abscess.  Pharmacy has been consulted to resume vancomycin . Scr < 1 and stable. Last dose of vancomycin  10/9 at 11:04.   Plan: Stop Cefepime Vancomycin  750 mg IV Q 12 hrs.  Expected AUC: 566 SCr used: 0.8  F/u ability to switch to oral linezolid Monitor 10/8 wound culture   Height: 5' 1 (154.9 cm) Weight: 54.4 kg (120 lb) IBW/kg (Calculated) : 47.8  Temp (24hrs), Avg:98.1 F (36.7 C), Min:98 F (36.7 C), Max:98.2 F (36.8 C)  Recent Labs  Lab 07/04/24 1120 07/05/24 0501 07/06/24 1201 07/07/24 0455 07/08/24 0435  WBC 5.6 6.5 6.8 6.3 5.8  CREATININE 0.56 0.47 0.44 0.50 0.49    Estimated Creatinine Clearance: 76.2 mL/min (by C-G formula based on SCr of 0.49 mg/dL).    Allergies  Allergen Reactions   Nsaids Other (See Comments)    Abdominal pain    Tamiflu  [Oseltamivir  Phosphate] Hives, Itching and Nausea And Vomiting   Codeine  Nausea And Vomiting    Specifically tylenol  3     Thank you for allowing pharmacy to be a part of this patient's care.  Damien Quiet, PharmD, BCPS, BCIDP Infectious Diseases Clinical Pharmacist Phone: [phone] 07/08/2024 10:07 AM

## 2024-07-08 NOTE — Consult Note (Signed)
 [facility]                                                                                        Patient Identification: Patient Name: April [name] MRN: [id number] Admit Date: 07/04/2024 10:59 AM Today's Date: 07/08/2024 Reason for consult: Left chest wall pain/swelling  Requesting provider: Dr Sebastian   Principal Problem:   Chest wall abscess Active Problems:   Depression   Soft tissue mass   Microcytic anemia   History of heroin use   Antibiotics:  Vancomycin  10/6-10/9, Linezolid 10/10- Cefepime 10/6-  Lines/Hardware:  Assessment # Enlarging chronic left inframammary chest wall swelling/pain  - CT with irregular 6.4 x 3.7 cm area of  inflammation with small area of fluid within, and area of fluid measuring up to 2.3 x 1.2 cm, some mass effect upon the adjacent liver.  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 Would save linezolid as a discharge option. DC cefepime as unlikely to be GNR process.  - fu IR cultures to completion  - monitor for improvement of tenderness/erythema at concerned site - HBV and HCV serology ordered  - monitor CBC, BMP and Vancomycin  trough  - contact precautions ordered  Dr. Luiz covering this weekend.  New ID team to follow starting Monday  Rest of the management as per the primary team. Please call with questions or concerns.  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 linezolid for 2 weeks, who presented to the ED on 10/6 with pain and swelling under her left breast for 6 months and prompting to come to ED due to her recent increase in size.  She reports it started with a small nodule under her left breast and continued to enlarge without any trauma or injury.  Some nausea intermittent but no vomiting or fever or chills or abdominal pain or diarrhea or constipation.  Denies any chest pain, cough or shortness of breath.  She reports prior history of IV heroin but has been clean for 3 to 4 months. She reports completing 2 weeks of linezolid she was discharged during August 2024.   She was seen in the ED on 9/17 for same but left  AMA. At ED afebrile CBC and BMP unremarkable, hemoglobin 9.7 HIV nonreactive Received IV Dilaudid , IV Toradol , IV Zofran , IV vancomycin  and IV cefepime in the ED Oncology was consulted for evaluation and recommended IR aspiration for pathology  10/6 blood cx 2/2 NGTD  CT 10/6 Changes about the costal cartilage of the left sixth through eighth ribs as described. Infection is again felt to be most likely. Outpatient tissue sampling and or fluid sampling is recommended for further evaluation.  ROS: General- Denies fever, chills, loss of appetite and loss of weight HEENT - Denies headache, blurry vision, neck pain, sinus pain Chest -  Denies any chest pain, SOB or cough CVS- Denies any dizziness/lightheadedness, syncopal attacks, palpitations Abdomen- Denies any nausea, vomiting, abdominal pain, hematochezia and diarrhea Neuro - Denies any weakness, numbness, tingling sensation Psych - Denies any changes in mood irritability or depressive symptoms GU- Denies any burning, dysuria, hematuria or increased frequency of urination Skin - denies any rashes/lesions MSK - denies any joint pain/swelling or restricted ROM   Past Medical History:  Diagnosis Date   Depression    GERD (gastroesophageal reflux disease)    Herpes    Infection    UTI   UTI (urinary tract infection) in pregnancy in first trimester    Past Surgical History:  Procedure Laterality Date   CESAREAN SECTION MULTI-GESTATIONAL  12/31/2015   Procedure: CESAREAN SECTION MULTI-GESTATIONAL;  Surgeon: Winton Felt, MD;  Location: [facility];  Service: Obstetrics;;   DILATION AND CURETTAGE OF UTERUS     IR US  GUIDE BX ASP/DRAIN  07/06/2024    Scheduled Meds:  Chlorhexidine  Gluconate Cloth  6 each Topical Daily   enoxaparin (LOVENOX) injection  40 mg Subcutaneous Q24H   lidocaine -EPINEPHrine  20 mL Intradermal Once   linezolid  600 mg Oral Q12H   mupirocin  ointment  1 Application Nasal BID   traMADol   100 mg Oral TID   Continuous Infusions:  ceFEPime (MAXIPIME) IV 2 g (07/08/24 0555)   PRN Meds:.acetaminophen  **OR** acetaminophen , bisacodyl , diphenhydrAMINE , melatonin, ondansetron  **OR** ondansetron  (ZOFRAN ) IV, oxyCODONE -acetaminophen , senna-docusate  Allergies  Allergen Reactions   Nsaids Other (See Comments)    Abdominal pain    Tamiflu  [Oseltamivir  Phosphate] Hives, Itching and Nausea And Vomiting   Codeine  Nausea And Vomiting    Specifically tylenol  3   Social History   Socioeconomic History   Marital status: Single    Spouse name: Not on file   Number of children: Not on file   Years of education: Not on file   Highest education level: Not  on file  Occupational History   Not on file  Tobacco Use   Smoking status: Every Day    Current packs/day: 0.25    Average packs/day: 0.3 packs/day for 17.2 years (4.3 ttl pk-yrs)    Types: Cigarettes    Start date: 04/29/2007   Smokeless tobacco: Never   Tobacco comments:    Declines help  Substance and Sexual Activity   Alcohol use: No   Drug use: No   Sexual activity: Yes    Partners: Male    Birth control/protection: None  Other Topics Concern   Not on file  Social History Narrative   Not on file   Social Drivers of Health   Financial Resource Strain: Not on file  Food Insecurity: No Food Insecurity (07/05/2024)   Hunger Vital Sign    Worried About Running Out of Food in the Last Year: Never true    Ran Out of Food in the  Last Year: Never true  Transportation Needs: No Transportation Needs (07/05/2024)   PRAPARE - Administrator, Civil Service (Medical): No    Lack of Transportation (Non-Medical): No  Physical Activity: Not on file  Stress: No Stress Concern Present (05/09/2023)   Received from [facility] - Occupational Stress Questionnaire    Feeling of Stress : Not at all  Social Connections: Unknown (03/01/2023)   Received from [facility]   Social Network    Social Network: Not on file  Intimate Partner Violence: Not At Risk (07/05/2024)   Humiliation, Afraid, Rape, and Kick questionnaire    Fear of Current or Ex-Partner: No    Emotionally Abused: No    Physically Abused: No    Sexually Abused: No    Family History  Problem Relation Age of Onset   Hypertension Maternal Grandmother    Depression Maternal Grandmother    Heart disease Maternal Grandmother    Cancer Maternal Grandfather        lung     Vitals BP 100/65 (BP Location: Right Arm)   Pulse (!) 55   Temp 98 F (36.7 C)   Resp 16   Ht 5' 1 (1.549 m)   Wt 54.4 kg   SpO2 100%   BMI 22.67 kg/m    Physical Exam Constitutional:  Adult female sitting up in the bed, not in acute distress    Comments HEENT WNL  Cardiovascular:     Rate and Rhythm: Normal rate and regular rhythm.     Heart sounds: S1 and S2  Pulmonary:     Effort: Pulmonary effort is normal.     Comments normal breath sounds  Abdominal:     Palpations: Abdomen is soft.     Tenderness: Nondistended and nontender  Musculoskeletal:        General: No swelling or tenderness in peripheral joints including left hip, left shoulder, left elbow  Skin:    Comments no rashes Chaperoned exam Left inframammary chest area with some swelling, erythema and tenderness, no open wounds, no fluctuance or crepitus  Neurological:     General: Awake, alert and oriented, grossly nonfocal  Psychiatric:        Mood and Affect: Mood normal.    Pertinent Microbiology Results for orders placed or performed during the hospital encounter of 07/04/24  Blood culture (routine x 2)     Status: None (Preliminary result)   Collection Time: 07/04/24 10:10 PM   Specimen: BLOOD  Result Value Ref Range Status   Specimen Description   Final    BLOOD LEFT ANTECUBITAL Performed at [facility], [address], [place], [place] [address]    Special Requests   Final    Blood Culture adequate volume BOTTLES DRAWN AEROBIC AND ANAEROBIC Performed at [facility], [address], [place], [place] [address]    Culture   Final    NO GROWTH 3 DAYS Performed at [facility], [address] [place], [place] [address]    Report Status PENDING  Incomplete  Blood culture (routine x 2)     Status: None (Preliminary result)   Collection Time: 07/04/24 10:15 PM   Specimen: BLOOD  Result Value Ref Range Status   Specimen Description   Final    BLOOD BLOOD RIGHT FOREARM Performed at [facility], [address] [place], [place] [address]    Special Requests   Final  Blood Culture adequate volume BOTTLES DRAWN AEROBIC  AND ANAEROBIC Performed at [facility], [address] [place], [place] [address]    Culture   Final    NO GROWTH 3 DAYS Performed at [facility], [address] [place], [place] [address]    Report Status PENDING  Incomplete  MRSA Next Gen by PCR, Nasal     Status: Abnormal   Collection Time: 07/05/24  9:12 AM   Specimen: Nasal Mucosa; Nasal Swab  Result Value Ref Range Status   MRSA by PCR Next Gen DETECTED (A) NOT DETECTED Final    Comment: RESULT CALLED TO, READ BACK BY AND VERIFIED WITH:  THOMPSON, T 07/05/2024 1155 (NOTE) The GeneXpert MRSA Assay (FDA approved for NASAL specimens only), is one component of a comprehensive MRSA colonization surveillance program. It is not intended to diagnose MRSA infection nor to guide or monitor treatment for MRSA infections. Test performance is not FDA approved in patients less than 17 years old. Performed at [facility], [address] [place], [place] [address]   Aerobic/Anaerobic Culture w Gram Stain (surgical/deep wound)     Status: None (Preliminary result)   Collection Time: 07/06/24 11:03 AM   Specimen: Tissue  Result Value Ref Range Status   Specimen Description   Final    TISSUE Performed at [facility], [address], [place], [place] [address]    Special Requests   Final    LEFT CHEST Performed at [facility], [address] [place], [place] [address]    Gram Stain NO WBC SEEN NO ORGANISMS SEEN   Final   Culture   Final    NO GROWTH 2 DAYS NO ANAEROBES ISOLATED; CULTURE IN PROGRESS FOR 5 DAYS Performed at [facility], [address], [place], [place] [address]    Report Status PENDING  Incomplete   Pathology 10/8 FINAL MICROSCOPIC DIAGNOSIS:   A. CHEST WALL MASS, LEFT, BIOPSY:  Fibrosis and inflammation.  See comment.   The core biopsies show fibrosis with hypervascularity and foci of  predominantly chronic  inflammation with scattered vacuolated  histiocytes.  A neoplastic process is not identified and the features  favor a reactive process.   Dr. Reed reviewed this case and agrees.    Pertinent Lab seen by me:    Latest Ref Rng & Units 07/08/2024    4:35 AM 07/07/2024    4:55 AM 07/06/2024   12:01 PM  CBC  WBC 4.0 - 10.5 K/uL 5.8  6.3  6.8   Hemoglobin 12.0 - 15.0 g/dL 9.5  9.6  9.5   Hematocrit 36.0 - 46.0 % 32.3  33.0  32.6   Platelets 150 - 400 K/uL 335  300  279       Latest Ref Rng & Units 07/08/2024    4:35 AM 07/07/2024    4:55 AM 07/06/2024   12:01 PM  CMP  Glucose 70 - 99 mg/dL 96  90  89   BUN 6 - 20 mg/dL 9  7  6    Creatinine 0.44 - 1.00 mg/dL 9.50  9.49  9.55   Sodium 135 - 145 mmol/L 135  136  136   Potassium 3.5 - 5.1 mmol/L 3.9  4.2  3.8   Chloride 98 - 111 mmol/L 103  103  104   CO2 22 - 32 mmol/L 22  22  22    Calcium 8.9 - 10.3 mg/dL 9.6  9.4  9.1    Pertinent Imagings/Other Imagings Plain films and CT images have been personally visualized and interpreted; radiology reports have been reviewed. Decision making incorporated into the Impression / Recommendations   05/13/23 MRI Left hip  IMPRESSION:  1.  Persistent left sacroiliac septic joint with trace joint space fluid.  2.  No drainable soft tissue abscess identified.  3.  Unremarkable hips.  4.  Mild pelvic fluid.   05/14/23 TEE Left Ventricle: Systolic function is normal.    Left Ventricle: Wall motion is normal.   Nothing visualized on any cardiac structures to suggest  vegetation/endocarditis   06/04/23  abdomen/pelvis  IMPRESSION:  1. No acute CT findings in the abdomen or pelvis.  2.  Sequela of prior left sacroiliitis. There appears to be improved inflammation relative to prior MRI.  3.  Large volume of stool in the colon. Correlate for constipation.  4.  Hepatomegaly.  ________________________________________________________ Per care-everywhere from June 2024 admission at [facility]  MRI  cervical spine IMPRESSION: Normal  MRI R Shoulder IMPRESSION: 1. Moderate glenohumeral joint effusion. The minimal enhancement and lack of surrounding soft tissue and marrow edema suggests that this is likely degenerative/reactive or posttraumatic in nature. 2. No evidence of osteomyelitis. 3. Mild inflammatory or less likely infectious subacromial, subdeltoid bursitis. 4. Degenerative labral changes with tear or less likely sublabral foramen anterior superiorly and type I SLAP.  MRI L Spine IMPRESSION: No evidence of discitis osteomyelitis or epidural abscess. Small volume fluid signal/borderline syrinx within the central canal of the thoracic spinal cord. Distended urinary bladder.  MRI T Spine IMPRESSION: No evidence of discitis osteomyelitis or epidural abscess. Small volume fluid signal/borderline syrinx within the central canal of the thoracic spinal cord. Distended urinary bladder.  MRI R elbow IMPRESSION: 1. Moderate elbow joint effusion with synovial thickening. Septic arthritis cannot be excluded. 2. Cellulitis/myositis around the elbow.  FL Guided Aspiration Impression:  Fluoroscopy guided right elbow aspiration yielding less than 1 cc of fluid, sent to laboratory for testing.  XR Pelvis L Hip Impression: No acute bony abnormality.   US  abdomen IMPRESSION: 1. Fatty liver. 2. 14 mm hyperechoic focus left lobe liver. This could represent a hemangioma, but other etiology cannot be excluded. Follow-up hepatic ultrasound in 6 months to evaluate stability could be helpful.  TTE Mitral Valve: There's a mobile density attatched to the mitral valve apparatus. Left Ventricle: EF: 50-55%.  TEE Mitral Valve: Real time 3D imaging shows normal mitral structure with redundant mitral valve chordae. No oscillating echodensities on cardiac   IR US  Guide Bx Asp/Drain Result Date: 07/06/2024 INDICATION: [id number] Mass of left chest wall [id number].  History of IV drug use. EXAM:  ULTRASOUND-GUIDED LEFT CHEST WALL MASS BIOPSY COMPARISON:  SOFT TISSUE ULTRASOUND, 06/15/2024. CT CAP, 07/04/2024. MEDICATIONS: 1 mg Ativan p.o. ANESTHESIA/SEDATION: Moderate (conscious) sedation was employed during this procedure. A total of Versed 4 mg and Fentanyl  100 mcg was administered intravenously. Moderate Sedation Time: 10 minutes. The patient's level of consciousness and vital signs were monitored continuously by radiology nursing throughout the procedure under my direct supervision. COMPLICATIONS: None immediate. TECHNIQUE: Informed written consent was obtained from the patient after a discussion of the risks, benefits and alternatives to treatment. Questions regarding the procedure were encouraged and answered. Initial ultrasound scanning demonstrated a heterogeneously hypodense subcutaneous area at the inferior LEFT chest wall. An ultrasound image was saved for documentation purposes. The procedure was planned. A timeout was performed prior to the initiation of the procedure. The operative was prepped and  draped in the usual sterile fashion, and a sterile drape was applied covering the operative field. A timeout was performed prior to the initiation of the procedure. Local anesthesia was provided with 1% lidocaine  with epinephrine. Under direct ultrasound guidance, an 18 gauge core needle device was utilized to obtain to obtain 4 core needle biopsies of the LEFT chest wall lesion. The samples were placed in saline and submitted to pathology. The needle was removed and superficial hemostasis was achieved with manual compression. Post procedure scan was negative for significant hematoma. A dressing was applied. The patient tolerated the procedure well without immediate postprocedural complication. IMPRESSION: Successful ultrasound guided biopsy of a LEFT chest wall mass-like area. Thom Hall, MD [facility] Electronically Signed   By: Thom Hall M.D.   On: 07/06/2024 14:09   DG Chest 1 View Result Date: 07/06/2024 CLINICAL DATA:  Status post chest wall mass biopsy. EXAM: DG CHEST 1V COMPARISON:  CT chest 07/04/2024 and chest radiograph 01/29/2018. FINDINGS: Trachea is midline. Heart size normal. Lungs are clear. No pleural fluid. No pneumothorax. Soft tissue mass associated with the left seventh anterior rib, better seen on CT 07/04/2024. IMPRESSION: 1. No acute findings.  No pneumothorax. 2. Soft tissue mass associated with the left anterior seventh rib, better seen on CT 07/04/2024. Electronically Signed   By: Newell Eke M.D.   On: 07/06/2024 12:06   CT CHEST ABDOMEN PELVIS W CONTRAST Result Date: 07/04/2024 CLINICAL DATA:  Palpable abnormality in the left anterior chest wall below the left breast EXAM: CT CHEST, ABDOMEN, AND PELVIS WITH CONTRAST TECHNIQUE: Multidetector CT imaging of the chest, abdomen and pelvis was performed following the standard protocol during bolus administration of intravenous contrast. RADIATION DOSE REDUCTION: This exam was performed according to the departmental dose-optimization program which includes automated exposure control, adjustment of the mA and/or kV according to patient size and/or use of iterative reconstruction technique. CONTRAST:  OMNIPAQUE  IOHEXOL  300 MG/ML  SOLN COMPARISON:  None Available. FINDINGS: CT CHEST FINDINGS Cardiovascular: Heart is not significantly enlarged in size. Thoracic aorta and pulmonary artery as visualized are within normal limits. No coronary calcifications are seen. Mediastinum/Nodes: Thoracic inlet is within normal limits. No hilar or mediastinal adenopathy is noted. Some residual thymus tissue is noted in the anterior mediastinum. The esophagus as visualized is within normal limits. Lungs/Pleura: Lungs are well aerated bilaterally. No focal infiltrate or sizable effusion is noted. Musculoskeletal: No acute rib fracture is noted. No compression deformity is seen.  In the anterior left chest wall surrounding the costal cartilage of the sixth, seventh and eighth ribs on the left there is an irregular approximately 6.4 by 3.7 cm area of inflammation with small areas of fluid within. There is an area of fluid measuring up to 2.3 x 1.2 cm best seen on image number 44 this would correspond to that seen on prior ultrasound several weeks ago. Some mass effect upon the adjacent liver is noted. No encroachment into the liver is seen. Infection is felt to be primary consideration with neoplasm being less likely although not completely excluded on the basis of this exam. Remote trauma with localized hematoma could not be excluded although no traumatic history is given. CT ABDOMEN PELVIS FINDINGS Hepatobiliary: No focal liver abnormality is seen. No gallstones, gallbladder wall thickening, or biliary dilatation. Pancreas: Unremarkable. No pancreatic ductal dilatation or surrounding inflammatory changes. Spleen: Normal in size without focal abnormality. Adrenals/Urinary Tract: Adrenal glands are within normal limits. Kidneys demonstrate  a normal enhancement pattern bilaterally. No renal calculi or obstructive changes are seen. The bladder is partially distended. Stomach/Bowel: Scattered fecal material is noted throughout the colon. No obstructive or inflammatory changes are noted. The appendix is within normal limits. Small bowel and stomach are unremarkable. Vascular/Lymphatic: No significant vascular findings are present. No enlarged abdominal or pelvic lymph nodes. Reproductive: Uterus is unremarkable. Simple appearing cyst is noted within the right ovary measuring 3.1 cm. Other: No abdominal wall hernia or abnormality. No abdominopelvic ascites. Musculoskeletal: No acute bony abnormality in the abdomen and pelvis. IMPRESSION: Changes about the costal cartilage of the left sixth through eighth ribs as described. Infection is again felt to be most likely. Outpatient tissue sampling and  or fluid sampling is recommended for further evaluation. Right ovarian simple-appearing cyst measuring 3.1 cm. No follow-up imaging is recommended. Reference: JACR 2020 Feb;17(2):248-254 Electronically Signed   By: Oneil Devonshire M.D.   On: 07/04/2024 20:35   US  CHEST SOFT TISSUE Result Date: 06/15/2024 CLINICAL DATA:  Left breast mass. EXAM: ULTRASOUND OF CHEST SOFT TISSUES TECHNIQUE: Ultrasound examination of the chest wall soft tissues was performed in the area of clinical concern. COMPARISON:  None Available. FINDINGS: Limited and targeted sonographic images of the soft tissues of the left breast for evaluation of possible abscess. There is a 2.7 x 0.5 x 2.3 cm hypoechoic area in the region of the concern likely represents a small loculated collection. No internal vascularity or hyperemia in the surrounding tissue on color images. This may represent an inflammatory/infectious process. Other etiologies including malignancy are not excluded and not evaluated by this ultrasound. Correlation with clinical exam and dedicated mammographic studies, as clinically indicated, recommended. Follow-up after treatment is advised to document resolution. IMPRESSION: Small loculated collection. Clinical correlation and follow-up recommended. Please note this study is not a diagnostic ultrasound for possible underlying breast pathology. Electronically Signed   By: Vanetta Chou M.D.   On: 06/15/2024 15:50    I spent 87 minutes involved in face-to-face and non-face-to-face activities for this patient on the day of the visit. Professional time spent includes the following activities: Preparing to see the patient (review of tests), Obtaining and reviewing separately obtained history (ED note 9/17 and 10/6, H and P, Careeverywhere discharge notes June and August 2024), Performing a medically appropriate examination and evaluation, Ordering medications/labs, referring and communicating with other health care professionals,  Documenting clinical information in the EMR, Independently interpreting results (not separately reported), Communicating results to the patient, Counseling and educating the patient and Care coordination (not separately reported).  Electronically signed by:   Plan d/w requesting provider as well as ID pharm D  Of note, portions of this note may have been created with voice recognition software. While this note has been edited for accuracy, occasional wrong-word or 'sound-a-like' substitutions may have occurred due to the inherent limitations of voice recognition software.   Annalee Orem, MD Infectious Disease Physician [facility] Pager: [phone]

## 2024-07-09 DIAGNOSIS — M7989 Other specified soft tissue disorders: Secondary | ICD-10-CM | POA: Diagnosis not present

## 2024-07-09 DIAGNOSIS — F32A Depression, unspecified: Secondary | ICD-10-CM | POA: Diagnosis not present

## 2024-07-09 DIAGNOSIS — L02213 Cutaneous abscess of chest wall: Secondary | ICD-10-CM | POA: Diagnosis not present

## 2024-07-09 DIAGNOSIS — D509 Iron deficiency anemia, unspecified: Secondary | ICD-10-CM | POA: Diagnosis not present

## 2024-07-09 LAB — BASIC METABOLIC PANEL WITH GFR
Anion gap: 11 (ref 5–15)
BUN: 10 mg/dL (ref 6–20)
CO2: 22 mmol/L (ref 22–32)
Calcium: 9.7 mg/dL (ref 8.9–10.3)
Chloride: 102 mmol/L (ref 98–111)
Creatinine, Ser: 0.51 mg/dL (ref 0.44–1.00)
GFR, Estimated: >60 mL/min (ref >60)
Glucose, Bld: 89 mg/dL (ref 70–99)
Potassium: 3.8 mmol/L (ref 3.5–5.1)
Sodium: 135 mmol/L (ref 135–145)

## 2024-07-09 LAB — CBC WITH DIFFERENTIAL/PLATELET
Abs Immature Granulocytes: 0.02 K/uL (ref 0.00–0.07)
Basophils Absolute: 0 K/uL (ref 0.0–0.1)
Basophils Relative: 1 %
Eosinophils Absolute: 0 K/uL (ref 0.0–0.5)
Eosinophils Relative: 1 %
HCT: 30.8 % — ABNORMAL LOW (ref 36.0–46.0)
Hemoglobin: 9.5 g/dL — ABNORMAL LOW (ref 12.0–15.0)
Immature Granulocytes: 0 %
Lymphocytes Relative: 27 %
Lymphs Abs: 1.5 K/uL (ref 0.7–4.0)
MCH: 24.2 pg — ABNORMAL LOW (ref 26.0–34.0)
MCHC: 30.8 g/dL (ref 30.0–36.0)
MCV: 78.4 fL — ABNORMAL LOW (ref 80.0–100.0)
Monocytes Absolute: 0.4 K/uL (ref 0.1–1.0)
Monocytes Relative: 7 %
Neutro Abs: 3.6 K/uL (ref 1.7–7.7)
Neutrophils Relative %: 64 %
Platelets: 346 K/uL (ref 150–400)
RBC: 3.93 MIL/uL (ref 3.87–5.11)
RDW: 17.4 % — ABNORMAL HIGH (ref 11.5–15.5)
WBC: 5.7 K/uL (ref 4.0–10.5)
nRBC: 0 % (ref 0.0–0.2)

## 2024-07-09 MED ORDER — SENNOSIDES-DOCUSATE SODIUM 8.6-50 MG PO TABS
1.0000 | ORAL_TABLET | Freq: Two times a day (BID) | ORAL | Status: DC
  Administered 2024-07-09 – 2024-07-11 (×4): 1 via ORAL
  Filled 2024-07-09 (×4): qty 1

## 2024-07-09 MED ORDER — POLYETHYLENE GLYCOL 3350 17 G PO PACK
17.0000 g | PACK | Freq: Every day | ORAL | Status: DC
  Administered 2024-07-09 – 2024-07-10 (×2): 17 g via ORAL
  Filled 2024-07-09 (×3): qty 1

## 2024-07-09 MED ORDER — KETOROLAC TROMETHAMINE 30 MG/ML IJ SOLN
30.0000 mg | Freq: Four times a day (QID) | INTRAMUSCULAR | Status: DC | PRN
  Administered 2024-07-09 – 2024-07-10 (×4): 30 mg via INTRAVENOUS
  Filled 2024-07-09 (×4): qty 1

## 2024-07-09 NOTE — Progress Notes (Signed)
 PROGRESS NOTE    [name]  FMW:[id number] DOB: [birth year] DOA: 07/04/2024 PCP: [facility]    Chief Complaint  Patient presents with   Breast Mass    Brief Narrative:  April [name] is a 33 y.o. 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 Imaging concerning for abscess.  IR consulted for biopsy and cultures which were done.  Patient on empiric IV vancomycin .  ID consulted.   Assessment & Plan:   Principal Problem:   Chest wall abscess Active Problems:   Depression   Soft tissue mass   Microcytic anemia   History of heroin use  #1 left chest wall mass/rule out abscess/concurrent chest wall cellulitis/sepsis rule out - Patient with history of IV drug use reported remission for the past 3 to 4 months noted a left chest wall mass for the past 6 months with worsening erythema and pain. - CT chest abdomen and pelvis done with a 6.4 x 3.7 cm area of inflammation underneath the left 6th, 7th and 8th ribs with small notable area of fluid. -Blood cultures pending with no growth x 4 days. - It is noted that oncology consulted in the ED recommending tissue sampling. - IR consulted for tissue sampling of left chest wall mass/I&D which was done on, 07/06/2024 with aerobic and anaerobic cultures pending with no growth to date.  Blood cultures with no growth to date.  Nasal MRSA PCR positive.  -Biopsy done of left chest wall mass with fibrosis and inflammation and likely favoring a reactive process. -Was initially on IV vancomycin  which was discontinued and patient started on linezolid.  - Was on IV cefepime.  -Due to patient's history of prior IV drug use, MRSA infections in the past ID consulted for further evaluation and management. -Patient seen in  consultation by ID who suspect likely related to her prior MRSA infection during August admission when patient left AMA and transitioned to p.o. linezolid. - Per ID patient with also a prior MRSA bacteremia sensitive to daptomycin, vancomycin  and linezolid.  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 - ID recommended switching linezolid back to IV vancomycin  pending culture results and discontinuing cefepime. - ID has ordered HBV and HCV serology. - ID following and appreciate their input and recommendations. - Continue Percocet 5/325 mg 1 to 2 tablets every 4 hours as needed. - Discontinue scheduled Ultram  as patient states unable to tolerate.  -Place on IV Toradol  30 mg every 6 hours as needed.   - Avoid IV narcotics unless absolutely necessary given patient's history of IV drug use.  #2 acute on chronic microcytic anemia - Likely anemia in menstruating female. - Hemoglobin currently stable at 9.5.  Last hemoglobin noted at 9.7 (06/15/2024). - Patient with no overt bleeding. - Follow H&H. - Transfusion threshold hemoglobin < 7.  3.  Prior history of IV drug abuse with heroin -Patient noted to have a previous episode of bacteremia and epidural abscess in the past. - Patient reported last use 3 to 4 months ago.  4.  Depression -Not on any medications in the outpatient setting. - Outpatient follow-up with PCP.  DVT prophylaxis: Lovenox>>> SCDs.  Patient noted to be refusing Lovenox. Code Status: Full Family Communication: Updated patient.  Husband asleep at bedside.  Disposition: Home when clinically improved and cleared by ID.  Status is: Inpatient Remains inpatient appropriate because: Severity of illness   Consultants:  IR: Dr. Karalee 07/05/2024 ID: Dr.Manandhar 07/08/2024  Procedures:  CT chest abdomen pelvis 07/04/2024 Chest x-ray 07/06/2024 Left chest wall mass biopsy by IR: Dr.Mugweru 07/06/2024  Antimicrobials:   Anti-infectives (From admission, onward)    Start     Dose/Rate Route Frequency Ordered Stop   07/08/24 1100  vancomycin  (VANCOREADY) IVPB 750 mg/150 mL        750 mg 150 mL/hr over 60 Minutes Intravenous Every 12 hours 07/08/24 1011     07/08/24 1000  linezolid (ZYVOX) tablet 600 mg  Status:  Discontinued        600 mg Oral Every 12 hours 07/08/24 0856 07/08/24 1011   07/05/24 1100  vancomycin  (VANCOREADY) IVPB 750 mg/150 mL  Status:  Discontinued        750 mg 150 mL/hr over 60 Minutes Intravenous Every 12 hours 07/05/24 0150 07/07/24 1903   07/05/24 0600  ceFEPIme (MAXIPIME) 2 g in sodium chloride  0.9 % 100 mL IVPB  Status:  Discontinued        2 g 200 mL/hr over 30 Minutes Intravenous Every 8 hours 07/05/24 0145 07/08/24 0951   07/04/24 2130  vancomycin  (VANCOCIN ) IVPB 1000 mg/200 mL premix        1,000 mg 200 mL/hr over 60 Minutes Intravenous  Once 07/04/24 2125 07/04/24 2359   07/04/24 2130  ceFEPIme (MAXIPIME) 2 g in sodium chloride  0.9 % 100 mL IVPB        2 g 200 mL/hr over 30 Minutes Intravenous  Once 07/04/24 2125 07/04/24 2251         Subjective: Patient lying in bed with complaints of left-sided chest wall pain.  Feels Percocet is helping a little bit manage the pain.  Denies any shortness of breath.  No nausea or vomiting.  No abdominal pain.  Husband asleep at bedside.    Objective: Vitals:   07/08/24 1351 07/08/24 2111 07/09/24 0444 07/09/24 1213  BP: (!) 102/58 101/70 (!) 92/57 106/62  Pulse: 79 65 62 74  Resp: 16 15 15 16   Temp: 98 F (36.7 C) 98.4 F (36.9 C) 98.3 F (36.8 C) 98 F (36.7 C)  TempSrc:  Oral Oral   SpO2: 100% 100% 99% 100%  Weight:      Height:        Intake/Output Summary (Last 24 hours) at 07/09/2024 1251 Last data filed at 07/09/2024 0544 Gross per 24 hour  Intake 1620 ml  Output --  Net 1620 ml   Filed Weights   07/04/24 1102  Weight: 54.4 kg    Examination:  General exam: NAD. Respiratory system: Lungs clear to  auscultation bilaterally.  No wheezes, no crackles, no rhonchi.  Fair air movement.  Speaking in full sentences.  Cardiovascular system: Regular rate rhythm no murmurs rubs or gallops.  No JVD.  No pitting lower extremity edema.  Left chest wall TTP. Gastrointestinal system: Abdomen soft, nontender, nondistended, positive bowel sounds.  No rebound.  No guarding.  Central nervous system: Alert and oriented. No focal neurological deficits. Extremities: Symmetric 5 x 5 power. Skin: No rashes, lesions or ulcers Psychiatry: Judgement and insight appear normal. Mood & affect appropriate.     Data Reviewed: I have personally reviewed following labs and imaging studies  CBC: Recent Labs  Lab 07/05/24 0501 07/06/24 1201 07/07/24 0455 07/08/24 0435 07/09/24 0457  WBC 6.5 6.8 6.3  5.8 5.7  NEUTROABS  --  3.9 4.0  --  3.6  HGB 9.6* 9.5* 9.6* 9.5* 9.5*  HCT 31.6* 32.6* 33.0* 32.3* 30.8*  MCV 79.0* 80.7 81.1 79.8* 78.4*  PLT 321 279 300 335 346    Basic Metabolic Panel: Recent Labs  Lab 07/05/24 0501 07/06/24 1201 07/06/24 1202 07/07/24 0455 07/08/24 0435 07/09/24 0457  NA 136 136  --  136 135 135  K 4.0 3.8  --  4.2 3.9 3.8  CL 102 104  --  103 103 102  CO2 25 22  --  22 22 22   GLUCOSE 86 89  --  90 96 89  BUN 11 6  --  7 9 10   CREATININE 0.47 0.44  --  0.50 0.49 0.51  CALCIUM 9.2 9.1  --  9.4 9.6 9.7  MG  --   --  1.8  --   --   --     GFR: Estimated Creatinine Clearance: 76.2 mL/min (by C-G formula based on SCr of 0.51 mg/dL).  Liver Function Tests: Recent Labs  Lab 07/04/24 1918  AST 34  ALT 26  ALKPHOS 137*  BILITOT 0.3  PROT 7.9  ALBUMIN 4.3    CBG: No results for input(s): GLUCAP in the last 168 hours.   Recent Results (from the past 240 hours)  Blood culture (routine x 2)     Status: None (Preliminary result)   Collection Time: 07/04/24 10:10 PM   Specimen: BLOOD  Result Value Ref Range Status   Specimen Description   Final    BLOOD LEFT  ANTECUBITAL Performed at [facility], [address] [place], [place] [address]    Special Requests   Final    Blood Culture adequate volume BOTTLES DRAWN AEROBIC AND ANAEROBIC Performed at [facility], [address], [place], [place] [address]    Culture   Final    NO GROWTH 4 DAYS Performed at [facility], [address], [place], [place] [address]    Report Status PENDING  Incomplete  Blood culture (routine x 2)     Status: None (Preliminary result)   Collection Time: 07/04/24 10:15 PM   Specimen: BLOOD  Result Value Ref Range Status   Specimen Description   Final    BLOOD BLOOD RIGHT FOREARM Performed at [facility], [address] [place], [place] [address]    Special Requests   Final    Blood Culture adequate volume BOTTLES DRAWN AEROBIC AND ANAEROBIC Performed at [facility] [address] [place], [place] [address]    Culture   Final    NO GROWTH 4 DAYS Performed at [facility], [address], [place], [place] [address]    Report Status PENDING  Incomplete  MRSA Next Gen by PCR, Nasal     Status: Abnormal   Collection Time: 07/05/24  9:12 AM   Specimen: Nasal Mucosa; Nasal Swab  Result Value Ref Range Status   MRSA by PCR Next Gen DETECTED (A) NOT DETECTED Final    Comment: RESULT CALLED TO, READ BACK BY AND VERIFIED WITH:  April [name], T 07/05/2024 1155 (NOTE) The GeneXpert MRSA Assay (FDA approved for NASAL specimens only), is one component of a comprehensive MRSA colonization surveillance program. It is not intended to diagnose MRSA infection nor to guide or monitor treatment for MRSA infections. Test performance is not FDA approved in patients less than 49 years old. Performed at [facility], [address] [place], [place] [address]   Aerobic/Anaerobic Culture w Gram Stain (surgical/deep wound)     Status: None (Preliminary result)    Collection Time: 07/06/24 11:03 AM   Specimen: Tissue  Result Value Ref Range Status   Specimen Description   Final    TISSUE Performed at [facility], [address] [place], [place] [address]    Special Requests   Final    LEFT CHEST Performed at [facility], [address] [place], [place] [address]    Gram Stain NO WBC SEEN NO ORGANISMS SEEN   Final   Culture   Final    NO GROWTH 3 DAYS NO ANAEROBES ISOLATED; CULTURE IN PROGRESS FOR 5 DAYS Performed at [facility], [address], [place], [place] [address]    Report Status PENDING  Incomplete         Radiology Studies: No results found.       Scheduled Meds:  mupirocin  ointment  1 Application Nasal BID   traMADol   100 mg Oral TID   Continuous Infusions:  vancomycin  750 mg (07/09/24 1034)     LOS: 5 days    Time spent: 35 minutes    Toribio Hummer, MD [facility]   To contact the attending provider between 7A-7P or the covering provider during after hours 7P-7A, please log into the web site [email or web address] and access using universal [place] password for that web site. If you do not have the password, please call the hospital operator.  07/09/2024, 12:51 PM

## 2024-07-10 DIAGNOSIS — L02213 Cutaneous abscess of chest wall: Secondary | ICD-10-CM | POA: Diagnosis not present

## 2024-07-10 DIAGNOSIS — M7989 Other specified soft tissue disorders: Secondary | ICD-10-CM | POA: Diagnosis not present

## 2024-07-10 DIAGNOSIS — F32A Depression, unspecified: Secondary | ICD-10-CM | POA: Diagnosis not present

## 2024-07-10 DIAGNOSIS — D509 Iron deficiency anemia, unspecified: Secondary | ICD-10-CM | POA: Diagnosis not present

## 2024-07-10 LAB — CULTURE, BLOOD (ROUTINE X 2)
Culture: NO GROWTH
Culture: NO GROWTH
Special Requests: ADEQUATE
Special Requests: ADEQUATE

## 2024-07-10 NOTE — Plan of Care (Signed)
   Problem: Clinical Measurements: Goal: Diagnostic test results will improve Outcome: Progressing

## 2024-07-10 NOTE — Progress Notes (Signed)
 PROGRESS NOTE    April [name]  FMW:[id number] DOB: [birth year] DOA: 07/04/2024 PCP: [facility]    Chief Complaint  Patient presents with   Breast Mass    Brief Narrative:  April [name] is a 33 y.o. 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 Imaging concerning for abscess.  IR consulted for biopsy and cultures which were done.  Patient on empiric IV vancomycin .  ID consulted.   Assessment & Plan:   Principal Problem:   Chest wall abscess Active Problems:   Depression   Soft tissue mass   Microcytic anemia   History of heroin use  #1 left chest wall mass/rule out abscess/concurrent chest wall cellulitis/sepsis rule out - Patient with history of IV drug use reported remission for the past 3 to 4 months noted a left chest wall mass for the past 6 months with worsening erythema and pain. - CT chest abdomen and pelvis done with a 6.4 x 3.7 cm area of inflammation underneath the left 6th, 7th and 8th ribs with small notable area of fluid. -Blood cultures pending with no growth x 4 days. - It is noted that oncology consulted in the ED recommending tissue sampling. - IR consulted for tissue sampling of left chest wall mass/I&D which was done on, 07/06/2024 with aerobic and anaerobic cultures pending with no growth to date.  Blood cultures with no growth to date.  Nasal MRSA PCR positive.  -Biopsy done of left chest wall mass with fibrosis and inflammation and likely favoring a reactive process. -Was initially on IV vancomycin  which was discontinued and patient started on linezolid.  - Was on IV cefepime.  -Due to patient's history of prior IV drug use, MRSA infections in the past ID consulted for further evaluation and management. -Patient seen in  consultation by ID who suspect likely related to her prior MRSA infection during August admission when patient left AMA and transitioned to p.o. linezolid. - Per ID patient with also a prior MRSA bacteremia sensitive to daptomycin, vancomycin  and linezolid.  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 - ID recommended switching linezolid back to IV vancomycin  pending culture results and discontinuing cefepime. - ID has ordered HBV and HCV serology. - ID following and appreciate their input and recommendations. - Continue Percocet 5/325 mg 1 to 2 tablets every 4 hours as needed. - Discontinued scheduled Ultram  as patient states unable to tolerate.  -Continue IV Toradol  30 mg every 6 hours as needed.   - Avoid IV narcotics unless absolutely necessary given patient's history of IV drug use.  #2 acute on chronic microcytic anemia - Likely anemia in menstruating female. - Hemoglobin currently stable at 9.5.  Last hemoglobin noted at 9.7 (06/15/2024). - Patient with no overt bleeding. - Follow H&H. - Transfusion threshold hemoglobin < 7.  3.  Prior history of IV drug abuse with heroin -Patient noted to have a previous episode of bacteremia and epidural abscess in the past. - Patient reported last use 3 to 4 months ago.  4.  Depression -Not on any medications in the outpatient setting. - Outpatient follow-up with PCP.  DVT prophylaxis: Lovenox>>> SCDs.  Code Status: Full Family Communication: Updated patient.  Husband asleep at bedside.   Disposition: Home when clinically improved and cleared  by ID.  Status is: Inpatient Remains inpatient appropriate because: Severity of illness   Consultants:  IR: Dr. Karalee 07/05/2024 ID: Dr.Manandhar 07/08/2024  Procedures:  CT chest abdomen pelvis 07/04/2024 Chest x-ray 07/06/2024 Left chest wall mass biopsy by IR: Dr.Mugweru 07/06/2024  Antimicrobials:  Anti-infectives (From admission, onward)    Start      Dose/Rate Route Frequency Ordered Stop   07/08/24 1100  vancomycin  (VANCOREADY) IVPB 750 mg/150 mL        750 mg 150 mL/hr over 60 Minutes Intravenous Every 12 hours 07/08/24 1011     07/08/24 1000  linezolid (ZYVOX) tablet 600 mg  Status:  Discontinued        600 mg Oral Every 12 hours 07/08/24 0856 07/08/24 1011   07/05/24 1100  vancomycin  (VANCOREADY) IVPB 750 mg/150 mL  Status:  Discontinued        750 mg 150 mL/hr over 60 Minutes Intravenous Every 12 hours 07/05/24 0150 07/07/24 1903   07/05/24 0600  ceFEPIme (MAXIPIME) 2 g in sodium chloride  0.9 % 100 mL IVPB  Status:  Discontinued        2 g 200 mL/hr over 30 Minutes Intravenous Every 8 hours 07/05/24 0145 07/08/24 0951   07/04/24 2130  vancomycin  (VANCOCIN ) IVPB 1000 mg/200 mL premix        1,000 mg 200 mL/hr over 60 Minutes Intravenous  Once 07/04/24 2125 07/04/24 2359   07/04/24 2130  ceFEPIme (MAXIPIME) 2 g in sodium chloride  0.9 % 100 mL IVPB        2 g 200 mL/hr over 30 Minutes Intravenous  Once 07/04/24 2125 07/04/24 2251         Subjective: Patient lying in bed.  Patient frustrated that unable to get [employer] this morning.  Patient still with complaints of left-sided pain.  Denies any shortness of breath.  No abdominal pain.  Tolerating current diet.   Objective: Vitals:   07/08/24 2111 07/09/24 0444 07/09/24 1213 07/09/24 2107  BP: 101/70 (!) 92/57 106/62 110/72  Pulse: 65 62 74 84  Resp: 15 15 16 15   Temp: 98.4 F (36.9 C) 98.3 F (36.8 C) 98 F (36.7 C) 98.6 F (37 C)  TempSrc: Oral Oral    SpO2: 100% 99% 100% 100%  Weight:      Height:        Intake/Output Summary (Last 24 hours) at 07/10/2024 1148 Last data filed at 07/10/2024 0907 Gross per 24 hour  Intake 820 ml  Output --  Net 820 ml   Filed Weights   07/04/24 1102  Weight: 54.4 kg    Examination:  General exam: NAD. Respiratory system: CTAB.  No wheezes, no crackles, no rhonchi.  Fair air movement with speaking in full sentences.   Cardiovascular system: RRR no murmurs rubs or gallops.  No JVD.  No lower extremity edema.  Left chest wall TTP.  Gastrointestinal system: Abdomen is soft, nontender, nondistended, positive bowel sounds.  No rebound.  No guarding.   Central nervous system: Alert and oriented. No focal neurological deficits. Extremities: Symmetric 5 x 5 power. Skin: No rashes, lesions or ulcers Psychiatry: Judgement and insight appear normal. Mood & affect appropriate.     Data Reviewed: I have personally reviewed following labs and imaging studies  CBC: Recent Labs  Lab 07/05/24 0501 07/06/24 1201 07/07/24 0455 07/08/24 0435 07/09/24 0457  WBC 6.5 6.8 6.3 5.8 5.7  NEUTROABS  --  3.9 4.0  --  3.6  HGB 9.6* 9.5* 9.6* 9.5*  9.5*  HCT 31.6* 32.6* 33.0* 32.3* 30.8*  MCV 79.0* 80.7 81.1 79.8* 78.4*  PLT 321 279 300 335 346    Basic Metabolic Panel: Recent Labs  Lab 07/05/24 0501 07/06/24 1201 07/06/24 1202 07/07/24 0455 07/08/24 0435 07/09/24 0457  NA 136 136  --  136 135 135  K 4.0 3.8  --  4.2 3.9 3.8  CL 102 104  --  103 103 102  CO2 25 22  --  22 22 22   GLUCOSE 86 89  --  90 96 89  BUN 11 6  --  7 9 10   CREATININE 0.47 0.44  --  0.50 0.49 0.51  CALCIUM 9.2 9.1  --  9.4 9.6 9.7  MG  --   --  1.8  --   --   --     GFR: Estimated Creatinine Clearance: 76.2 mL/min (by C-G formula based on SCr of 0.51 mg/dL).  Liver Function Tests: Recent Labs  Lab 07/04/24 1918  AST 34  ALT 26  ALKPHOS 137*  BILITOT 0.3  PROT 7.9  ALBUMIN 4.3    CBG: No results for input(s): GLUCAP in the last 168 hours.   Recent Results (from the past 240 hours)  Blood culture (routine x 2)     Status: None   Collection Time: 07/04/24 10:10 PM   Specimen: BLOOD  Result Value Ref Range Status   Specimen Description   Final    BLOOD LEFT ANTECUBITAL Performed at [facility], [address] [place], [place] [address]    Special Requests   Final    Blood Culture adequate  volume BOTTLES DRAWN AEROBIC AND ANAEROBIC Performed at [facility], [address], [place], [place] [address]    Culture   Final    NO GROWTH 5 DAYS Performed at [facility], [address] [place], [place] [address]    Report Status 07/10/2024 FINAL  Final  Blood culture (routine x 2)     Status: None   Collection Time: 07/04/24 10:15 PM   Specimen: BLOOD  Result Value Ref Range Status   Specimen Description   Final    BLOOD BLOOD RIGHT FOREARM Performed at [facility], [address] [place], [place] [address]    Special Requests   Final    Blood Culture adequate volume BOTTLES DRAWN AEROBIC AND ANAEROBIC Performed at [facility], [address], [place], [place] [address]    Culture   Final    NO GROWTH 5 DAYS Performed at [facility], [address], [place], [place] [address]    Report Status 07/10/2024 FINAL  Final  MRSA Next Gen by PCR, Nasal     Status: Abnormal   Collection Time: 07/05/24  9:12 AM   Specimen: Nasal Mucosa; Nasal Swab  Result Value Ref Range Status   MRSA by PCR Next Gen DETECTED (A) NOT DETECTED Final    Comment: RESULT CALLED TO, READ BACK BY AND VERIFIED WITH:  Mancil Pfenning, T 07/05/2024 1155 (NOTE) The GeneXpert MRSA Assay (FDA approved for NASAL specimens only), is one component of a comprehensive MRSA colonization surveillance program. It is not intended to diagnose MRSA infection nor to guide or monitor treatment for MRSA infections. Test performance is not FDA approved in patients less than 58 years old. Performed at [facility], [address] [place], [place] [address]   Aerobic/Anaerobic Culture w Gram Stain (surgical/deep wound)  Status: None (Preliminary result)   Collection Time: 07/06/24 11:03 AM   Specimen: Tissue  Result Value Ref Range Status   Specimen Description   Final    TISSUE Performed at [facility], [address] [place], [place] [address]    Special Requests   Final    LEFT CHEST Performed at [facility], [address] [place], [place] [address]    Gram Stain NO WBC SEEN NO ORGANISMS SEEN   Final   Culture   Final    NO GROWTH 4 DAYS NO ANAEROBES ISOLATED; CULTURE IN PROGRESS FOR 5 DAYS Performed at [facility], [address] [place], [place] [address]    Report Status PENDING  Incomplete         Radiology Studies: No results found.       Scheduled Meds:  polyethylene glycol  17 g Oral Daily   senna-docusate  1 tablet Oral BID   Continuous Infusions:  vancomycin  750 mg (07/10/24 1026)     LOS: 6 days    Time spent: 35 minutes    Toribio Hummer, MD [facility]   To contact the attending provider between 7A-7P or the covering provider during after hours 7P-7A, please log into the web site [email or web address] and access using universal [place] password for that web site. If you do not have the password, please call the hospital operator.  07/10/2024, 11:48 AM

## 2024-07-11 ENCOUNTER — Telehealth: Payer: Self-pay

## 2024-07-11 ENCOUNTER — Other Ambulatory Visit (HOSPITAL_COMMUNITY): Payer: Self-pay

## 2024-07-11 ENCOUNTER — Other Ambulatory Visit: Payer: Self-pay

## 2024-07-11 DIAGNOSIS — L02213 Cutaneous abscess of chest wall: Secondary | ICD-10-CM | POA: Diagnosis not present

## 2024-07-11 DIAGNOSIS — D509 Iron deficiency anemia, unspecified: Secondary | ICD-10-CM | POA: Diagnosis not present

## 2024-07-11 DIAGNOSIS — F1191 Opioid use, unspecified, in remission: Secondary | ICD-10-CM | POA: Diagnosis not present

## 2024-07-11 DIAGNOSIS — L03313 Cellulitis of chest wall: Secondary | ICD-10-CM

## 2024-07-11 DIAGNOSIS — F32A Depression, unspecified: Secondary | ICD-10-CM | POA: Diagnosis not present

## 2024-07-11 LAB — AEROBIC/ANAEROBIC CULTURE W GRAM STAIN (SURGICAL/DEEP WOUND)
Culture: NO GROWTH
Gram Stain: NONE SEEN

## 2024-07-11 LAB — CBC WITH DIFFERENTIAL/PLATELET
Abs Immature Granulocytes: 0 K/uL (ref 0.00–0.07)
Basophils Absolute: 0 K/uL (ref 0.0–0.1)
Basophils Relative: 1 %
Eosinophils Absolute: 0.1 K/uL (ref 0.0–0.5)
Eosinophils Relative: 1 %
HCT: 31.7 % — ABNORMAL LOW (ref 36.0–46.0)
Hemoglobin: 9.8 g/dL — ABNORMAL LOW (ref 12.0–15.0)
Immature Granulocytes: 0 %
Lymphocytes Relative: 38 %
Lymphs Abs: 1.8 K/uL (ref 0.7–4.0)
MCH: 24.4 pg — ABNORMAL LOW (ref 26.0–34.0)
MCHC: 30.9 g/dL (ref 30.0–36.0)
MCV: 78.9 fL — ABNORMAL LOW (ref 80.0–100.0)
Monocytes Absolute: 0.4 K/uL (ref 0.1–1.0)
Monocytes Relative: 8 %
Neutro Abs: 2.6 K/uL (ref 1.7–7.7)
Neutrophils Relative %: 52 %
Platelets: 322 K/uL (ref 150–400)
RBC: 4.02 MIL/uL (ref 3.87–5.11)
RDW: 17.5 % — ABNORMAL HIGH (ref 11.5–15.5)
WBC: 4.9 K/uL (ref 4.0–10.5)
nRBC: 0 % (ref 0.0–0.2)

## 2024-07-11 LAB — BASIC METABOLIC PANEL WITH GFR
Anion gap: 10 (ref 5–15)
BUN: 16 mg/dL (ref 6–20)
CO2: 21 mmol/L — ABNORMAL LOW (ref 22–32)
Calcium: 9.5 mg/dL (ref 8.9–10.3)
Chloride: 104 mmol/L (ref 98–111)
Creatinine, Ser: 0.53 mg/dL (ref 0.44–1.00)
GFR, Estimated: >60 mL/min (ref >60)
Glucose, Bld: 89 mg/dL (ref 70–99)
Potassium: 4.2 mmol/L (ref 3.5–5.1)
Sodium: 135 mmol/L (ref 135–145)

## 2024-07-11 LAB — HEPATITIS B CORE ANTIBODY, TOTAL: HEP B CORE AB: POSITIVE — AB

## 2024-07-11 LAB — HEPATITIS B SURFACE ANTIBODY, QUANTITATIVE: Hep B S AB Quant (Post): 13.1 m[IU]/mL

## 2024-07-11 MED ORDER — SENNOSIDES-DOCUSATE SODIUM 8.6-50 MG PO TABS: 1.0000 | ORAL_TABLET | Freq: Two times a day (BID) | ORAL | Status: DC

## 2024-07-11 MED ORDER — ONDANSETRON 4 MG PO TBDP
4.0000 mg | ORAL_TABLET | Freq: Three times a day (TID) | ORAL | 0 refills | Status: DC | PRN
  Filled 2024-07-11: qty 20, 7d supply, fill #0

## 2024-07-11 MED ORDER — POLYETHYLENE GLYCOL 3350 17 GM/SCOOP PO POWD
17.0000 g | Freq: Every day | ORAL | 0 refills | Status: DC
  Filled 2024-07-11: qty 238, 14d supply, fill #0

## 2024-07-11 MED ORDER — LINEZOLID 600 MG PO TABS
600.0000 mg | ORAL_TABLET | Freq: Two times a day (BID) | ORAL | 0 refills | Status: AC
  Filled 2024-07-11: qty 16, 8d supply, fill #0

## 2024-07-11 MED ORDER — OXYCODONE-ACETAMINOPHEN 5-325 MG PO TABS
1.0000 | ORAL_TABLET | ORAL | 0 refills | Status: DC | PRN
  Filled 2024-07-11: qty 20, 2d supply, fill #0

## 2024-07-11 NOTE — Progress Notes (Signed)
 Patient's concerns and questions answered prior to discharge. Will be leaving the hospital via front entrance with significant other.

## 2024-07-11 NOTE — Discharge Summary (Signed)
 Physician Discharge Summary  April [name] FMW:[id number] DOB: 11-10-1990 DOA: 07/04/2024  PCP: [facility]  Admit date: 07/04/2024 Discharge date: 07/11/2024  Time spent: 60 minutes  Recommendations for Outpatient Follow-up:  Follow-up with Dr.Manandhar, ID in 1 to 2 weeks. Follow-up with [facility] in 2 weeks.  On follow-up patient will need a basic metabolic profile done to follow-up on electrolytes and renal function.  Patient will need a CBC done to follow-up on counts.   Discharge Diagnoses:  Principal Problem:   Chest wall abscess Active Problems:   Depression   Soft tissue mass   Microcytic anemia   History of heroin use   Discharge Condition: Stable and improved.  Diet recommendation: Regular  Filed Weights   07/04/24 1102  Weight: 54.4 kg    History of present illness:  HPI per Dr. Lou Vernell [name] is a 33 y.o. 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  Patient reports that 6 months ago, she noticed a small nodule underneath her left breast.  Over the last few weeks, it has increased in size and is now tender with surrounding erythema.  She is unable to lay on her left side due to the pain.  She endorses intermittent nausea over the last week but denies any fevers, chills, abdominal pain, chest pain, spontaneous drainage of the area, any bites to the area, nipple discharge, or breast pain.  Reports a history of IV heroin use but has been in remission since 3-4 months ago.   ED Course: Initial vitals show patient afebrile and normotensive.  Initial labs shows Hgb 9.7 otherwise normal renal function, LFTs and negative pregnancy test.  CT chest/abdomen/pelvis shows a 6.4 x 3.7 cm area of inflammation underneath the left 6th, 7th and 8th ribs with small  areas of fluid within and another area of fluid measuring 2.3 x 1.2 cm.  Pt received IV Dilaudid , IV Toradol  IV Zofran , IV vancomycin  and IV cefepime.  Oncology was consulted for evaluation. TRH was consulted for admission.    Hospital Course:  #1 left chest wall cellulitis/rule out abscess/concurrent chest wall cellulitis/sepsis ruled out - Patient with history of IV drug use reported remission for the past 3 to 4 months noted a left chest wall mass for the past 6 months with worsening erythema and pain. - CT chest abdomen and pelvis done with a 6.4 x 3.7 cm area of inflammation underneath the left 6th, 7th and 8th ribs with small notable area of fluid. -Blood cultures negative x 5 days.  - It is noted that oncology consulted in the ED recommended tissue sampling. - IR consulted for tissue sampling of left chest wall mass/I&D which was done on, 07/06/2024 with aerobic and anaerobic cultures pending with no growth to date.  Blood cultures with no growth to date.  Nasal MRSA PCR positive.  -Biopsy done of left chest wall mass with fibrosis and inflammation and likely favoring a reactive process. -Was initially on IV vancomycin  which was discontinued and patient started on linezolid.  - Was on IV cefepime.  -Due to patient's history of prior IV drug use, MRSA infections in the past ID consulted for further evaluation and management. -Patient seen in consultation by ID who suspect likely related to her prior MRSA infection during August admission when patient left AMA and transitioned to p.o.  linezolid. - Per ID patient with also a prior MRSA bacteremia sensitive to daptomycin, vancomycin  and linezolid.  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 - ID recommended switching linezolid back to IV vancomycin  pending culture results and discontinuing cefepime. - ID ordered HBV and HCV serology. - Patient maintained on Percocet 5/325 1 to 2 tablets every 4 hours as  needed during the hospitalization.   - Patient be discharged on Percocet 5/325 1 to 2 tablets every 4 hours as needed dispense #20.   - Patient was followed by ID and patient will follow-up in the ID clinic 1 to 2 weeks postdischarge.     #2 acute on chronic microcytic anemia - Likely anemia in menstruating female. - Hemoglobin stable during the hospitalization and noted at 9.8 by day of discharge.  Last hemoglobin noted at 9.7 (06/15/2024). - Patient with no overt bleeding. -Outpatient follow-up with PCP.   3.  Prior history of IV drug abuse with heroin -Patient noted to have a previous episode of bacteremia and epidural abscess in the past. - Patient reported last use 3 to 4 months prior to admission.    4.  Depression -Not on any medications in the outpatient setting. - Outpatient follow-up with PCP.    Procedures: CT chest abdomen pelvis 07/04/2024 Chest x-ray 07/06/2024 Left chest wall mass biopsy by IR: Dr.Mugweru 07/06/2024  Consultations: IR: Dr. Karalee 07/05/2024 ID: Dr.Manandhar 07/08/2024  Discharge Exam: Vitals:   07/10/24 2128 07/11/24 1251  BP: 106/65 113/68  Pulse: 96 69  Resp: 17 16  Temp:  98.4 F (36.9 C)  SpO2: 100% (!) 79%    General: NAD Cardiovascular: RRR no murmurs rubs or gallops.  No JVD.  No lower extremity edema.  Left-sided chest wall tender to palpation. Respiratory: Clear to auscultation bilaterally.  No wheezes, no crackles, rhonchi.  Fair air movement.  Speaking in full sentences.  Discharge Instructions   Discharge Instructions     Diet general   Complete by: As directed    Increase activity slowly   Complete by: As directed    No wound care   Complete by: As directed       Allergies as of 07/11/2024       Reactions   Nsaids Other (See Comments)   Abdominal pain    Tamiflu  [oseltamivir  Phosphate] Hives, Itching, Nausea And Vomiting   Codeine  Nausea And Vomiting   Specifically tylenol  3        Medication List      STOP taking these medications    meclizine  25 MG tablet Commonly known as: ANTIVERT        TAKE these medications    acetaminophen  500 MG tablet Commonly known as: TYLENOL  Take 1 tablet (500 mg total) by mouth every 6 (six) hours as needed.   ferrous sulfate  325 (65 FE) MG tablet Take 1 tablet (325 mg total) by mouth 2 (two) times daily with a meal.   ibuprofen  200 MG tablet Commonly known as: ADVIL  Take 200 mg by mouth every 4 (four) hours as needed for fever, headache, mild pain, moderate pain or cramping.   linezolid 600 MG tablet Commonly known as: ZYVOX Take 1 tablet (600 mg total) by mouth 2 (two) times daily for 8 days.   ondansetron  4 MG disintegrating tablet Commonly known as: ZOFRAN -ODT Take 1 tablet (4 mg total) by mouth every 8 (eight) hours as needed for nausea or vomiting.   oxyCODONE -acetaminophen  5-325 MG tablet Commonly  known as: PERCOCET/ROXICET Take 1-2 tablets by mouth every 4 (four) hours as needed for severe pain (pain score 7-10).   polyethylene glycol powder 17 GM/SCOOP powder Commonly known as: GLYCOLAX /MIRALAX  Take 17 g by mouth daily. Dissolve 1 capful (17g) in 4-8 ounces of liquid and take by mouth daily. Start taking on: July 12, 2024   Prenatal Vitamins 28-0.8 MG Tabs Take 1 tablet by mouth as directed.   senna-docusate 8.6-50 MG tablet Commonly known as: Senokot-S Take 1 tablet by mouth 2 (two) times daily.       Allergies  Allergen Reactions   Nsaids Other (See Comments)    Abdominal pain    Tamiflu  [Oseltamivir  Phosphate] Hives, Itching and Nausea And Vomiting   Codeine  Nausea And Vomiting    Specifically tylenol  3    Follow-up Information     [facility]. Schedule an appointment as soon as possible for a visit in 2 week(s).   Specialty: Internal Medicine Contact information: [address] [place] [address] [phone]         Dea Shiner, MD. Schedule an appointment as  soon as possible for a visit in 2 week(s).   Specialty: Infectious Diseases Why: Follow-up in 1 to 2 weeks. Contact information: [address] [employer] [address] [place] [address] [phone]                  The results of significant diagnostics from this hospitalization (including imaging, microbiology, ancillary and laboratory) are listed below for reference.    Significant Diagnostic Studies: IR US  Guide Bx Asp/Drain Result Date: 07/06/2024 INDICATION: [id number] Mass of left chest wall [id number].  History of IV drug use. EXAM: ULTRASOUND-GUIDED LEFT CHEST WALL MASS BIOPSY COMPARISON:  SOFT TISSUE ULTRASOUND, 06/15/2024. CT CAP, 07/04/2024. MEDICATIONS: 1 mg Ativan p.o. ANESTHESIA/SEDATION: Moderate (conscious) sedation was employed during this procedure. A total of Versed 4 mg and Fentanyl  100 mcg was administered intravenously. Moderate Sedation Time: 10 minutes. The patient's level of consciousness and vital signs were monitored continuously by radiology nursing throughout the procedure under my direct supervision. COMPLICATIONS: None immediate. TECHNIQUE: Informed written consent was obtained from the patient after a discussion of the risks, benefits and alternatives to treatment. Questions regarding the procedure were encouraged and answered. Initial ultrasound scanning demonstrated a heterogeneously hypodense subcutaneous area at the inferior LEFT chest wall. An ultrasound image was saved for documentation purposes. The procedure was planned. A timeout was performed prior to the initiation of the procedure. The operative was prepped and draped in the usual sterile fashion, and a sterile drape was applied covering the operative field. A timeout was performed prior to the initiation of the procedure. Local anesthesia was provided with 1% lidocaine  with epinephrine. Under direct ultrasound guidance, an 18 gauge core needle device was utilized to obtain to obtain 4 core needle biopsies of  the LEFT chest wall lesion. The samples were placed in saline and submitted to pathology. The needle was removed and superficial hemostasis was achieved with manual compression. Post procedure scan was negative for significant hematoma. A dressing was applied. The patient tolerated the procedure well without immediate postprocedural complication. IMPRESSION: Successful ultrasound guided biopsy of a LEFT chest wall mass-like area. Thom Hall, MD [facility] Radiology Electronically Signed   By: Thom Hall M.D.   On: 07/06/2024 14:09   DG Chest 1 View Result Date: 07/06/2024 CLINICAL DATA:  Status post chest wall mass biopsy. EXAM: DG CHEST 1V COMPARISON:  CT chest 07/04/2024 and chest radiograph 01/29/2018. FINDINGS: Trachea is midline. Heart size normal. Lungs are clear. No pleural fluid. No pneumothorax. Soft tissue mass associated with the left seventh anterior rib, better seen on CT 07/04/2024. IMPRESSION: 1. No acute findings.  No pneumothorax. 2. Soft tissue mass associated with the left anterior seventh rib, better seen on CT 07/04/2024. Electronically Signed   By: Newell Eke M.D.   On: 07/06/2024 12:06   CT CHEST ABDOMEN PELVIS W CONTRAST Result Date: 07/04/2024 CLINICAL DATA:  Palpable abnormality in the left anterior chest wall below the left breast EXAM: CT CHEST, ABDOMEN, AND PELVIS WITH CONTRAST TECHNIQUE: Multidetector CT imaging of the chest, abdomen and pelvis was performed following the standard protocol during bolus administration of intravenous contrast. RADIATION DOSE REDUCTION: This exam was performed according to the departmental dose-optimization program which includes automated exposure control, adjustment of the mA and/or kV according to patient size and/or use of iterative reconstruction technique. CONTRAST:  OMNIPAQUE  IOHEXOL  300 MG/ML  SOLN COMPARISON:  None Available. FINDINGS: CT CHEST FINDINGS Cardiovascular: Heart is  not significantly enlarged in size. Thoracic aorta and pulmonary artery as visualized are within normal limits. No coronary calcifications are seen. Mediastinum/Nodes: Thoracic inlet is within normal limits. No hilar or mediastinal adenopathy is noted. Some residual thymus tissue is noted in the anterior mediastinum. The esophagus as visualized is within normal limits. Lungs/Pleura: Lungs are well aerated bilaterally. No focal infiltrate or sizable effusion is noted. Musculoskeletal: No acute rib fracture is noted. No compression deformity is seen. In the anterior left chest wall surrounding the costal cartilage of the sixth, seventh and eighth ribs on the left there is an irregular approximately 6.4 by 3.7 cm area of inflammation with small areas of fluid within. There is an area of fluid measuring up to 2.3 x 1.2 cm best seen on image number 44 this would correspond to that seen on prior ultrasound several weeks ago. Some mass effect upon the adjacent liver is noted. No encroachment into the liver is seen. Infection is felt to be primary consideration with neoplasm being less likely although not completely excluded on the basis of this exam. Remote trauma with localized hematoma could not be excluded although no traumatic history is given. CT ABDOMEN PELVIS FINDINGS Hepatobiliary: No focal liver abnormality is seen. No gallstones, gallbladder wall thickening, or biliary dilatation. Pancreas: Unremarkable. No pancreatic ductal dilatation or surrounding inflammatory changes. Spleen: Normal in size without focal abnormality. Adrenals/Urinary Tract: Adrenal glands are within normal limits. Kidneys demonstrate a normal enhancement pattern bilaterally. No renal calculi or obstructive changes are seen. The bladder is partially distended. Stomach/Bowel: Scattered fecal material is noted throughout the colon. No obstructive or inflammatory changes are noted. The appendix is within normal limits. Small bowel and stomach are  unremarkable. Vascular/Lymphatic: No significant vascular findings are present. No enlarged abdominal or pelvic lymph nodes. Reproductive: Uterus is unremarkable. Simple appearing cyst is noted within the right ovary measuring 3.1 cm. Other: No abdominal wall hernia or abnormality. No abdominopelvic ascites. Musculoskeletal: No acute bony abnormality in the abdomen and pelvis. IMPRESSION: Changes about the costal cartilage of the left sixth through eighth ribs as described. Infection is again felt to be most likely. Outpatient tissue sampling and or fluid sampling is recommended for further evaluation. Right ovarian simple-appearing cyst measuring 3.1 cm. No follow-up imaging is recommended. Reference: JACR 2020 Feb;17(2):248-254 Electronically Signed   By: Oneil Devonshire M.D.   On: 07/04/2024 20:35   US  CHEST SOFT  TISSUE Result Date: 06/15/2024 CLINICAL DATA:  Left breast mass. EXAM: ULTRASOUND OF CHEST SOFT TISSUES TECHNIQUE: Ultrasound examination of the chest wall soft tissues was performed in the area of clinical concern. COMPARISON:  None Available. FINDINGS: Limited and targeted sonographic images of the soft tissues of the left breast for evaluation of possible abscess. There is a 2.7 x 0.5 x 2.3 cm hypoechoic area in the region of the concern likely represents a small loculated collection. No internal vascularity or hyperemia in the surrounding tissue on color images. This may represent an inflammatory/infectious process. Other etiologies including malignancy are not excluded and not evaluated by this ultrasound. Correlation with clinical exam and dedicated mammographic studies, as clinically indicated, recommended. Follow-up after treatment is advised to document resolution. IMPRESSION: Small loculated collection. Clinical correlation and follow-up recommended. Please note this study is not a diagnostic ultrasound for possible underlying breast pathology. Electronically Signed   By: Vanetta Chou M.D.    On: 06/15/2024 15:50    Microbiology: Recent Results (from the past 240 hours)  Blood culture (routine x 2)     Status: None   Collection Time: 07/04/24 10:10 PM   Specimen: BLOOD  Result Value Ref Range Status   Specimen Description   Final    BLOOD LEFT ANTECUBITAL Performed at [facility], [address] [place], [place] [address]    Special Requests   Final    Blood Culture adequate volume BOTTLES DRAWN AEROBIC AND ANAEROBIC Performed at [facility], [address], [place], [place] [address]    Culture   Final    NO GROWTH 5 DAYS Performed at [facility], [address], [place], [place] [address]    Report Status 07/10/2024 FINAL  Final  Blood culture (routine x 2)     Status: None   Collection Time: 07/04/24 10:15 PM   Specimen: BLOOD  Result Value Ref Range Status   Specimen Description   Final    BLOOD BLOOD RIGHT FOREARM Performed at [facility], [address], [place], [place] [address]    Special Requests   Final    Blood Culture adequate volume BOTTLES DRAWN AEROBIC AND ANAEROBIC Performed at [facility], [address] [place], [place] [address]    Culture   Final    NO GROWTH 5 DAYS Performed at [facility], [address] [place], [place] [address]    Report Status 07/10/2024 FINAL  Final  MRSA Next Gen by PCR, Nasal     Status: Abnormal   Collection Time: 07/05/24  9:12 AM   Specimen: Nasal Mucosa; Nasal Swab  Result Value Ref Range Status   MRSA by PCR Next Gen DETECTED (A) NOT DETECTED Final    Comment: RESULT CALLED TO, READ BACK BY AND VERIFIED WITH:  Favour Aleshire, T 07/05/2024 1155 (NOTE) The GeneXpert MRSA Assay (FDA approved for NASAL specimens only), is one component of a comprehensive MRSA colonization surveillance program. It is not intended to diagnose MRSA infection nor to guide or monitor treatment for MRSA infections. Test performance is not  FDA approved in patients less than 43 years old. Performed at [facility], [address] [place], [place] [address]   Aerobic/Anaerobic Culture w Gram Stain (surgical/deep wound)     Status: None (Preliminary result)   Collection Time: 07/06/24 11:03 AM   Specimen: Tissue  Result Value Ref Range Status   Specimen Description   Final  TISSUE Performed at [facility], [address], [place], [place] [address]    Special Requests   Final    LEFT CHEST Performed at [facility], [address], [place], [place] [address]    Gram Stain NO WBC SEEN NO ORGANISMS SEEN   Final   Culture   Final    NO GROWTH 5 DAYS NO ANAEROBES ISOLATED; CULTURE IN PROGRESS FOR 5 DAYS Performed at [facility], [address], [place], [place] [address]    Report Status PENDING  Incomplete     Labs: Basic Metabolic Panel: Recent Labs  Lab 07/06/24 1201 07/06/24 1202 07/07/24 0455 07/08/24 0435 07/09/24 0457 07/11/24 0832  NA 136  --  136 135 135 135  K 3.8  --  4.2 3.9 3.8 4.2  CL 104  --  103 103 102 104  CO2 22  --  22 22 22  21*  GLUCOSE 89  --  90 96 89 89  BUN 6  --  7 9 10 16   CREATININE 0.44  --  0.50 0.49 0.51 0.53  CALCIUM 9.1  --  9.4 9.6 9.7 9.5  MG  --  1.8  --   --   --   --    Liver Function Tests: Recent Labs  Lab 07/04/24 1918  AST 34  ALT 26  ALKPHOS 137*  BILITOT 0.3  PROT 7.9  ALBUMIN 4.3   No results for input(s): LIPASE, AMYLASE in the last 168 hours. No results for input(s): AMMONIA in the last 168 hours. CBC: Recent Labs  Lab 07/06/24 1201 07/07/24 0455 07/08/24 0435 07/09/24 0457 07/11/24 0832  WBC 6.8 6.3 5.8 5.7 4.9  NEUTROABS 3.9 4.0  --  3.6 2.6  HGB 9.5* 9.6* 9.5* 9.5* 9.8*  HCT 32.6* 33.0* 32.3* 30.8* 31.7*  MCV 80.7 81.1 79.8* 78.4* 78.9*  PLT 279 300 335 346 322   Cardiac Enzymes: No results for input(s): CKTOTAL, CKMB, CKMBINDEX, TROPONINI in the last 168  hours. BNP: BNP (last 3 results) No results for input(s): BNP in the last 8760 hours.  ProBNP (last 3 results) No results for input(s): PROBNP in the last 8760 hours.  CBG: No results for input(s): GLUCAP in the last 168 hours.     Signed:  Toribio Hummer MD.  [facility] 07/11/2024, 3:12 PM

## 2024-07-11 NOTE — Plan of Care (Signed)

## 2024-07-11 NOTE — Progress Notes (Signed)
    [facility]    Date of Admission:  07/04/2024   Total days of antibiotics 8          ID: April [name] is a 33 y.o. female with   Principal Problem:   Chest wall abscess Active Problems:   Depression   Soft tissue mass   Microcytic anemia   History of heroin use    Subjective: Afebrile. Less redness to chest wall  Medications:   polyethylene glycol  17 g Oral Daily   senna-docusate  1 tablet Oral BID    Objective: Vital signs in last 24 hours: Temp:  [98.4 F (36.9 C)-99 F (37.2 C)] 98.4 F (36.9 C) (10/13 1251) Pulse Rate:  [69-96] 69 (10/13 1251) Resp:  [16-17] 16 (10/13 1251) BP: (106-113)/(65-68) 113/68 (10/13 1251) SpO2:  [79 %-100 %] 79 % (10/13 1251) Physical Exam  Constitutional:  oriented to person, place, and time. appears well-developed and well-nourished. No distress.  HENT: [place]/AT, PERRLA, no scleral icterus Mouth/Throat: Oropharynx is clear and moist. No oropharyngeal exudate.  Cardiovascular: Normal rate, regular rhythm and normal heart sounds. Exam reveals no gallop and no friction rub.  No murmur heard.  Pulmonary/Chest: Effort normal and breath sounds normal. No respiratory distress.  has no wheezes.  Neck = supple, no nuchal rigidity Abdominal: Soft. Bowel sounds are normal.  exhibits no distension. There is no tenderness.  Lymphadenopathy: no cervical adenopathy. No axillary adenopathy Neurological: alert and oriented to person, place, and time.  Skin: Skin is warm and dry. No rash noted. No erythema.  Psychiatric: a normal mood and affect.  behavior is normal.    Lab Results Recent Labs    07/09/24 0457 07/11/24 0832  WBC 5.7 4.9  HGB 9.5* 9.8*  HCT 30.8* 31.7*  NA 135 135  K 3.8 4.2  CL 102 104  CO2 22 21*  BUN 10 16  CREATININE 0.51 0.53   Liver Panel No results for input(s): PROT, ALBUMIN, AST, ALT, ALKPHOS, BILITOT, BILIDIR, IBILI in the last 72 hours. Sedimentation Rate No  results for input(s): ESRSEDRATE in the last 72 hours. C-Reactive Protein No results for input(s): CRP in the last 72 hours.  Microbiology: NGTD Studies/Results: No results found.   Assessment/Plan: Chest wall cellulitis/abscess = will transition to linezolid 600mg  po bid to complete 14 day course of treatment for cellulitis  Will see back in 1-2 wk to see that she is improving  Continue on contact isolation, hx of mrsa  evaluation of this patient requires complex antimicrobial therapy evaluation and counseling and isolation needs for disease transmission risk assessment and mitigation.    [facility] for Infectious Diseases Pager: [phone]  07/11/2024, 2:00 PM

## 2024-07-11 NOTE — Progress Notes (Signed)
 Additional discharge meds in a secure bag delivered to patient in room by this RN

## 2024-07-11 NOTE — Progress Notes (Signed)
 Discharge med in secure bag delivered to patient in hallway

## 2024-07-11 NOTE — Telephone Encounter (Signed)
 Left patient a voice mail to call us  back to schedule a 1-2 week hospital follow up with Dr. Luiz.

## 2024-07-12 LAB — HCV RT-PCR, QUANT (NON-GRAPH)
HCV log10: 5.706 {Log_IU}/mL
Hepatitis C Quantitation: 508000 [IU]/mL

## 2024-07-12 LAB — HCV AB W REFLEX TO QUANT PCR: HCV Ab: REACTIVE — AB

## 2024-07-13 LAB — HEPATITIS B SURFACE ANTIGEN

## 2024-07-16 ENCOUNTER — Ambulatory Visit: Payer: Self-pay | Admitting: Infectious Diseases

## 2024-07-25 ENCOUNTER — Ambulatory Visit: Admitting: Internal Medicine

## 2024-07-25 ENCOUNTER — Encounter: Payer: Self-pay | Admitting: Internal Medicine

## 2024-07-25 ENCOUNTER — Other Ambulatory Visit: Payer: Self-pay

## 2024-07-25 VITALS — BP 106/69 | HR 93 | Temp 97.7°F | Ht 61.0 in | Wt 133.0 lb

## 2024-07-25 DIAGNOSIS — B182 Chronic viral hepatitis C: Secondary | ICD-10-CM

## 2024-07-25 DIAGNOSIS — M7989 Other specified soft tissue disorders: Secondary | ICD-10-CM | POA: Diagnosis present

## 2024-07-25 MED ORDER — LINEZOLID 600 MG PO TABS: 600.0000 mg | ORAL_TABLET | Freq: Two times a day (BID) | ORAL | 0 refills | Status: AC

## 2024-07-25 NOTE — Progress Notes (Unsigned)
 Patient ID: April [name], female   DOB: [birth year], 33 y.o.   MRN: [id number]  HPI Corah is a 33yo F with recent hospitalization for Left sided mass/abscess. She completed the antibiotics without difficulty. She states that the area is no longer red but still has small lump still palpable. No fever chills nightsweats.no tenderness to the areas of left chest wall.  Discussed chronic hepatitis c and what it would be for treatment options. Outpatient Encounter Medications as of 07/25/2024  Medication Sig   linezolid (ZYVOX) 600 MG tablet Take 1 tablet (600 mg total) by mouth 2 (two) times daily.   ondansetron  (ZOFRAN -ODT) 4 MG disintegrating tablet Take 1 tablet (4 mg total) by mouth every 8 (eight) hours as needed for nausea or vomiting.   [DISCONTINUED] polyethylene glycol powder (GLYCOLAX /MIRALAX ) 17 GM/SCOOP powder Take 17 g by mouth daily. Dissolve 1 capful (17g) in 4-8 ounces of liquid and take by mouth daily.   [DISCONTINUED] acetaminophen  (TYLENOL ) 500 MG tablet Take 1 tablet (500 mg total) by mouth every 6 (six) hours as needed. (Patient not taking: Reported on 07/25/2024)   [DISCONTINUED] ferrous sulfate  325 (65 FE) MG tablet Take 1 tablet (325 mg total) by mouth 2 (two) times daily with a meal. (Patient not taking: Reported on 07/25/2024)   [DISCONTINUED] hydrALAZINE  (APRESOLINE ) 50 MG tablet Take 1 tablet (50 mg total) by mouth every 8 (eight) hours. (Patient not taking: Reported on 02/12/2016)   [DISCONTINUED] ibuprofen  (ADVIL ,MOTRIN ) 200 MG tablet Take 200 mg by mouth every 4 (four) hours as needed for fever, headache, mild pain, moderate pain or cramping. (Patient not taking: Reported on 07/25/2024)   [DISCONTINUED] oxyCODONE -acetaminophen  (PERCOCET/ROXICET) 5-325 MG tablet Take 1-2 tablets by mouth every 4 (four) hours as needed for severe pain (pain score 7-10). (Patient not taking: Reported on 07/25/2024)   [DISCONTINUED] Prenatal Vit-Fe Fumarate-FA (PRENATAL VITAMINS) 28-0.8  MG TABS Take 1 tablet by mouth as directed. (Patient not taking: Reported on 07/25/2024)   [DISCONTINUED] senna-docusate (SENOKOT-S) 8.6-50 MG tablet Take 1 tablet by mouth 2 (two) times daily. (Patient not taking: Reported on 07/25/2024)   No facility-administered encounter medications on file as of 07/25/2024.     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   Back pain affecting pregnancy in second trimester 10/10/2015   Twin pregnancy, antepartum condition or complication 06/28/2015   Menorrhagia 07/07/2013   Sleep difficulties 11/07/2011   Depression 10/23/2011   ABNORMAL VAGINAL BLEEDING 07/17/2008   RhD negative 03/07/2008   UTI'S, HX OF 03/07/2008   TOBACCO ABUSE 10/27/2007     Health Maintenance Due  Topic Date Due   DTaP/Tdap/Td (1 - Tdap) Never done   Pneumococcal Vaccine (1 of 2 - PCV) Never done   Hepatitis B Vaccines 19-59 Average Risk (1 of 3 - 19+ 3-dose series) Never done   HPV VACCINES (1 - 3-dose SCDM series) Never done   Cervical Cancer Screening (HPV/Pap Cotest)  04/28/2022   Influenza Vaccine  Never done   COVID-19 Vaccine (1 - 2025-26 season) Never  done     Review of Systems 12 point ros is negative except what is mentioned above Physical Exam   BP 106/69   Pulse 93   Temp 97.7 F (36.5 C) (Temporal)   Ht 5' 1 (1.549 m)   Wt 133 lb (60.3 kg)   SpO2 97%   BMI 25.13 kg/m    Physical Exam  Constitutional:  oriented to person, place, and time. appears well-developed  and well-nourished. No distress.  HENT: [place]/AT, PERRLA, no scleral icterus Mouth/Throat: Oropharynx is clear and moist. No oropharyngeal exudate.  Cardiovascular: Normal rate, regular rhythm and normal heart sounds. Exam reveals no gallop and no friction rub.  No murmur heard.  Pulmonary/Chest: Effort normal and breath sounds normal. No respiratory distress.  has no wheezes.   Skin: Skin is warm and dry. No rash noted. No erythema.  Psychiatric: a normal mood and affect.  behavior is normal.   Lab Results  Component Value Date   LABRPR Non Reactive 12/30/2015    CBC Lab Results  Component Value Date   WBC 4.9 07/11/2024   RBC 4.02 07/11/2024   HGB 9.8 (L) 07/11/2024   HCT 31.7 (L) 07/11/2024   PLT 322 07/11/2024   MCV 78.9 (L) 07/11/2024   MCH 24.4 (L) 07/11/2024   MCHC 30.9 07/11/2024   RDW 17.5 (H) 07/11/2024   LYMPHSABS 1.8 07/11/2024   MONOABS 0.4 07/11/2024   EOSABS 0.1 07/11/2024    BMET Lab Results  Component Value Date   NA 135 07/11/2024   K 4.2 07/11/2024   CL 104 07/11/2024   CO2 21 (L) 07/11/2024   GLUCOSE 89 07/11/2024   BUN 16 07/11/2024   CREATININE 0.53 07/11/2024   CALCIUM 9.5 07/11/2024   GFRNONAA >60 07/11/2024   GFRAA >60 11/11/2016    Lab Results  Component Value Date   ESRSEDRATE 6 07/25/2024   Lab Results  Component Value Date   CRP <3.0 07/25/2024     Assessment and Plan Chest wall abscess= will check sed rate and crp to see if it is back to baseline. No erythema but still having small palpable area - scar tissue?  Chronic hepatitis c= HC Viral load = 500K, no transaminitis; will check genotype to help with deciding next step of treatment. Discussed lab results that have been done thus far.  [phone]  Will see back in

## 2024-07-28 LAB — HEPATITIS C GENOTYPE

## 2024-07-28 LAB — CBC WITH DIFFERENTIAL/PLATELET
Absolute Lymphocytes: 2477 {cells}/uL (ref 850–3900)
Absolute Monocytes: 423 {cells}/uL (ref 200–950)
Basophils Absolute: 41 {cells}/uL (ref 0–200)
Basophils Relative: 0.7 %
Eosinophils Absolute: 93 {cells}/uL (ref 15–500)
Eosinophils Relative: 1.6 %
HCT: 32.4 % — ABNORMAL LOW (ref 35.0–45.0)
Hemoglobin: 10.1 g/dL — ABNORMAL LOW (ref 11.7–15.5)
MCH: 24.5 pg — ABNORMAL LOW (ref 27.0–33.0)
MCHC: 31.2 g/dL — ABNORMAL LOW (ref 32.0–36.0)
MCV: 78.6 fL — ABNORMAL LOW (ref 80.0–100.0)
MPV: 9.3 fL (ref 7.5–12.5)
Monocytes Relative: 7.3 %
Neutro Abs: 2767 {cells}/uL (ref 1500–7800)
Neutrophils Relative %: 47.7 %
Platelets: 220 Thousand/uL (ref 140–400)
RBC: 4.12 Million/uL (ref 3.80–5.10)
RDW: 17.1 % — ABNORMAL HIGH (ref 11.0–15.0)
Total Lymphocyte: 42.7 %
WBC: 5.8 Thousand/uL (ref 3.8–10.8)

## 2024-07-28 LAB — C-REACTIVE PROTEIN: CRP: <3 mg/L (ref <8.0)

## 2024-07-28 LAB — SEDIMENTATION RATE: Sed Rate: 6 mm/h (ref 0–20)

## 2024-08-11 ENCOUNTER — Other Ambulatory Visit: Payer: Self-pay

## 2024-08-11 ENCOUNTER — Ambulatory Visit: Admitting: Internal Medicine

## 2024-08-11 ENCOUNTER — Encounter: Payer: Self-pay | Admitting: Internal Medicine

## 2024-08-11 VITALS — BP 119/73 | HR 90 | Ht 61.0 in | Wt 135.0 lb

## 2024-08-11 DIAGNOSIS — B182 Chronic viral hepatitis C: Secondary | ICD-10-CM

## 2024-08-11 DIAGNOSIS — L089 Local infection of the skin and subcutaneous tissue, unspecified: Secondary | ICD-10-CM

## 2024-08-11 MED ORDER — ONDANSETRON 4 MG PO TBDP: 4.0000 mg | ORAL_TABLET | Freq: Three times a day (TID) | ORAL | 1 refills | Status: AC | PRN

## 2024-08-11 NOTE — Progress Notes (Signed)
 RFV: follow up for chest wall cellulitis, and hep c Patient ID: April [name], female   DOB: July 11, 1991, 33 y.o.   MRN: [id number]  HPI 33yo F with history of Right chest wall is now improved since taking antibiotics. She states that she is concerned about her hep c/liver cirrhosis.  She feels like her abdomen is increased size, no history of ascites where she has needed paracentesis.  Outpatient Encounter Medications as of 08/11/2024  Medication Sig   linezolid  (ZYVOX ) 600 MG tablet Take 1 tablet (600 mg total) by mouth 2 (two) times daily.   ondansetron  (ZOFRAN -ODT) 4 MG disintegrating tablet Take 1 tablet (4 mg total) by mouth every 8 (eight) hours as needed for nausea or vomiting.   No facility-administered encounter medications on file as of 08/11/2024.     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   Back pain affecting pregnancy in second trimester 10/10/2015   Twin pregnancy, antepartum condition or complication 06/28/2015   Menorrhagia 07/07/2013   Sleep difficulties 11/07/2011   Depression 10/23/2011   ABNORMAL VAGINAL BLEEDING 07/17/2008   RhD negative 03/07/2008   UTI'S, HX OF 03/07/2008   TOBACCO ABUSE 10/27/2007     Health Maintenance Due  Topic Date Due   DTaP/Tdap/Td (1 - Tdap) Never done   Pneumococcal Vaccine (1 of 2 - PCV) Never done   Hepatitis B Vaccines 19-59 Average Risk (1 of 3 - 19+ 3-dose series) Never done   HPV VACCINES (1 - 3-dose SCDM series) Never done    Cervical Cancer Screening (HPV/Pap Cotest)  04/28/2022   Influenza Vaccine  Never done   COVID-19 Vaccine (1 - 2025-26 season) Never done     Review of Systems 12 point ros is otherwise negative Physical Exam   BP 119/73   Pulse 90   Ht 5' 1 (1.549 m)   Wt 135 lb (61.2 kg)   SpO2 100%   BMI 25.51 kg/m    Physical Exam  Constitutional:  oriented to person, place, and time. appears well-developed and well-nourished. No distress.  HENT: [place]/AT, PERRLA, no scleral icterus Mouth/Throat: Oropharynx is clear and moist. No oropharyngeal exudate.  Cardiovascular: Normal rate, regular rhythm and normal heart sounds. Exam reveals no gallop and no friction rub.  No murmur heard.  Pulmonary/Chest: Effort normal and breath sounds normal. No respiratory distress.  has no wheezes.  Neck = supple, no nuchal rigidity Abdominal: Soft. Bowel sounds are normal.  exhibits no distension. There is no tenderness.  Lymphadenopathy: no cervical adenopathy. No axillary adenopathy Neurological: alert and oriented to person, place, and time.  Skin: Skin is warm and dry. No rash noted. No erythema.  Psychiatric: a normal mood and affect.  behavior is normal.   Lab Results  Component Value Date   LABRPR Non Reactive 12/30/2015    CBC Lab Results  Component Value Date   WBC 5.8 07/25/2024   RBC 4.12 07/25/2024   HGB 10.1 (L) 07/25/2024   HCT 32.4 (L) 07/25/2024   PLT 220 07/25/2024  MCV 78.6 (L) 07/25/2024   MCH 24.5 (L) 07/25/2024   MCHC 31.2 (L) 07/25/2024   RDW 17.1 (H) 07/25/2024   LYMPHSABS 1.8 07/11/2024   MONOABS 0.4 07/11/2024   EOSABS 93 07/25/2024    BMET Lab Results  Component Value Date   NA 135 07/11/2024   K 4.2 07/11/2024   CL 104 07/11/2024   CO2 21 (L) 07/11/2024   GLUCOSE 89 07/11/2024   BUN 16 07/11/2024   CREATININE 0.53 07/11/2024   CALCIUM 9.5 07/11/2024   GFRNONAA >60 07/11/2024   GFRAA >60 11/11/2016      Assessment and Plan Hep C,without chronic hepatic  coma = will check Abd u/s to evaluate if has ascites that would need paracentesis. On exam, does not appear to have fluid wave/excess ascites. Will address hep c treatment at next visit  Mrsa skin infection/cellulitis = resolved

## 2024-08-12 LAB — CBC WITH DIFFERENTIAL/PLATELET
Absolute Lymphocytes: 1986 {cells}/uL (ref 850–3900)
Absolute Monocytes: 363 {cells}/uL (ref 200–950)
Basophils Absolute: 22 {cells}/uL (ref 0–200)
Basophils Relative: 0.4 %
Eosinophils Absolute: 39 {cells}/uL (ref 15–500)
Eosinophils Relative: 0.7 %
HCT: 32.2 % — ABNORMAL LOW (ref 35.0–45.0)
Hemoglobin: 10.2 g/dL — ABNORMAL LOW (ref 11.7–15.5)
MCH: 25.2 pg — ABNORMAL LOW (ref 27.0–33.0)
MCHC: 31.7 g/dL — ABNORMAL LOW (ref 32.0–36.0)
MCV: 79.7 fL — ABNORMAL LOW (ref 80.0–100.0)
MPV: 9.2 fL (ref 7.5–12.5)
Monocytes Relative: 6.6 %
Neutro Abs: 3091 {cells}/uL (ref 1500–7800)
Neutrophils Relative %: 56.2 %
Platelets: 207 Thousand/uL (ref 140–400)
RBC: 4.04 Million/uL (ref 3.80–5.10)
RDW: 17.1 % — ABNORMAL HIGH (ref 11.0–15.0)
Total Lymphocyte: 36.1 %
WBC: 5.5 Thousand/uL (ref 3.8–10.8)

## 2024-08-12 LAB — C-REACTIVE PROTEIN: CRP: <3 mg/L (ref <8.0)

## 2024-08-12 LAB — COMPREHENSIVE METABOLIC PANEL WITH GFR
AG Ratio: 1.5 (calc) (ref 1.0–2.5)
ALT: 25 U/L (ref 6–29)
AST: 30 U/L (ref 10–30)
Albumin: 4.7 g/dL (ref 3.6–5.1)
Alkaline phosphatase (APISO): 75 U/L (ref 31–125)
BUN: 10 mg/dL (ref 7–25)
CO2: 26 mmol/L (ref 20–32)
Calcium: 9.4 mg/dL (ref 8.6–10.2)
Chloride: 103 mmol/L (ref 98–110)
Creat: 0.58 mg/dL (ref 0.50–0.97)
Globulin: 3.2 g/dL (ref 1.9–3.7)
Glucose, Bld: 73 mg/dL (ref 65–99)
Potassium: 3.9 mmol/L (ref 3.5–5.3)
Sodium: 137 mmol/L (ref 135–146)
Total Bilirubin: 0.4 mg/dL (ref 0.2–1.2)
Total Protein: 7.9 g/dL (ref 6.1–8.1)
eGFR: 123 mL/min/1.73m2 (ref >=60)

## 2024-08-12 LAB — SEDIMENTATION RATE: Sed Rate: 6 mm/h (ref 0–20)

## 2024-08-23 ENCOUNTER — Encounter: Payer: Self-pay | Admitting: Internal Medicine

## 2024-08-23 ENCOUNTER — Ambulatory Visit: Admitting: Family Medicine

## 2024-08-23 ENCOUNTER — Ambulatory Visit (HOSPITAL_COMMUNITY)
Admission: RE | Admit: 2024-08-23 | Discharge: 2024-08-23 | Disposition: A | Discharge status: Home / Self Care | Payer: Self-pay | Source: Ambulatory Visit | Attending: Internal Medicine | Admitting: Internal Medicine

## 2024-08-23 DIAGNOSIS — B182 Chronic viral hepatitis C: Secondary | ICD-10-CM | POA: Diagnosis present

## 2024-09-01 ENCOUNTER — Ambulatory Visit: Admitting: Internal Medicine

## 2024-09-07 ENCOUNTER — Ambulatory Visit: Admitting: Medical

## 2024-09-12 ENCOUNTER — Ambulatory Visit: Admitting: Internal Medicine

## 2024-09-19 ENCOUNTER — Ambulatory Visit: Admitting: Family Medicine

## 2024-09-26 ENCOUNTER — Ambulatory Visit: Payer: Self-pay | Admitting: Internal Medicine
